# Patient Record
Sex: Female | Born: 1950 | Race: Black or African American | Hispanic: No | Smoking: Former smoker
Health system: Southern US, Community
[De-identification: ages and names within clinical notes are randomized; demographics above are authoritative.]

## PROBLEM LIST (undated history)

## (undated) DIAGNOSIS — C50919 Malignant neoplasm of unspecified site of unspecified female breast: Secondary | ICD-10-CM

## (undated) DIAGNOSIS — K625 Hemorrhage of anus and rectum: Secondary | ICD-10-CM

## (undated) DIAGNOSIS — E785 Hyperlipidemia, unspecified: Secondary | ICD-10-CM

## (undated) DIAGNOSIS — E049 Nontoxic goiter, unspecified: Secondary | ICD-10-CM

## (undated) DIAGNOSIS — H409 Unspecified glaucoma: Secondary | ICD-10-CM

## (undated) DIAGNOSIS — Z923 Personal history of irradiation: Secondary | ICD-10-CM

## (undated) DIAGNOSIS — E119 Type 2 diabetes mellitus without complications: Secondary | ICD-10-CM

## (undated) DIAGNOSIS — I10 Essential (primary) hypertension: Secondary | ICD-10-CM

## (undated) HISTORY — DX: Nontoxic goiter, unspecified: E04.9

## (undated) HISTORY — DX: Essential (primary) hypertension: I10

## (undated) HISTORY — PX: BREAST LUMPECTOMY: SHX2

## (undated) HISTORY — DX: Malignant neoplasm of unspecified site of unspecified female breast: C50.919

## (undated) HISTORY — DX: Hyperlipidemia, unspecified: E78.5

## (undated) HISTORY — DX: Type 2 diabetes mellitus without complications: E11.9

## (undated) HISTORY — DX: Unspecified glaucoma: H40.9

---

## 1984-02-25 HISTORY — PX: ABDOMINAL HYSTERECTOMY: SHX81

## 1999-05-07 ENCOUNTER — Encounter: Payer: Self-pay | Admitting: Obstetrics and Gynecology

## 1999-05-07 ENCOUNTER — Encounter: Admission: RE | Admit: 1999-05-07 | Discharge: 1999-05-07 | Payer: Self-pay | Admitting: Obstetrics and Gynecology

## 1999-05-17 ENCOUNTER — Encounter: Admission: RE | Admit: 1999-05-17 | Discharge: 1999-05-17 | Payer: Self-pay | Admitting: Obstetrics and Gynecology

## 1999-05-17 ENCOUNTER — Encounter: Payer: Self-pay | Admitting: Obstetrics and Gynecology

## 2003-12-09 ENCOUNTER — Emergency Department (HOSPITAL_COMMUNITY): Admission: EM | Admit: 2003-12-09 | Discharge: 2003-12-09 | Payer: Self-pay | Admitting: Emergency Medicine

## 2005-01-23 ENCOUNTER — Emergency Department (HOSPITAL_COMMUNITY): Admission: EM | Admit: 2005-01-23 | Discharge: 2005-01-23 | Payer: Self-pay | Admitting: Emergency Medicine

## 2005-10-07 ENCOUNTER — Ambulatory Visit (HOSPITAL_COMMUNITY): Admission: RE | Admit: 2005-10-07 | Discharge: 2005-10-07 | Payer: Self-pay | Admitting: Family Medicine

## 2005-10-24 ENCOUNTER — Ambulatory Visit: Payer: Self-pay | Admitting: Gastroenterology

## 2005-11-19 ENCOUNTER — Ambulatory Visit: Payer: Self-pay | Admitting: Gastroenterology

## 2007-02-25 DIAGNOSIS — Z923 Personal history of irradiation: Secondary | ICD-10-CM

## 2007-02-25 HISTORY — DX: Personal history of irradiation: Z92.3

## 2007-02-25 HISTORY — PX: BREAST LUMPECTOMY: SHX2

## 2007-09-16 ENCOUNTER — Ambulatory Visit (HOSPITAL_COMMUNITY): Admission: RE | Admit: 2007-09-16 | Discharge: 2007-09-16 | Payer: Self-pay | Admitting: Family Medicine

## 2007-10-07 ENCOUNTER — Encounter: Admission: RE | Admit: 2007-10-07 | Discharge: 2007-10-07 | Payer: Self-pay | Admitting: Family Medicine

## 2007-10-15 ENCOUNTER — Encounter: Admission: RE | Admit: 2007-10-15 | Discharge: 2007-10-15 | Payer: Self-pay | Admitting: Family Medicine

## 2007-10-26 DIAGNOSIS — C50919 Malignant neoplasm of unspecified site of unspecified female breast: Secondary | ICD-10-CM

## 2007-10-26 HISTORY — DX: Malignant neoplasm of unspecified site of unspecified female breast: C50.919

## 2007-11-08 ENCOUNTER — Ambulatory Visit (HOSPITAL_BASED_OUTPATIENT_CLINIC_OR_DEPARTMENT_OTHER): Admission: RE | Admit: 2007-11-08 | Discharge: 2007-11-08 | Payer: Self-pay | Admitting: General Surgery

## 2007-11-08 ENCOUNTER — Encounter (INDEPENDENT_AMBULATORY_CARE_PROVIDER_SITE_OTHER): Payer: Self-pay | Admitting: General Surgery

## 2007-11-08 ENCOUNTER — Encounter: Admission: RE | Admit: 2007-11-08 | Discharge: 2007-11-08 | Payer: Self-pay | Admitting: General Surgery

## 2007-11-08 HISTORY — PX: BREAST SURGERY: SHX581

## 2007-12-01 ENCOUNTER — Ambulatory Visit (HOSPITAL_BASED_OUTPATIENT_CLINIC_OR_DEPARTMENT_OTHER): Admission: RE | Admit: 2007-12-01 | Discharge: 2007-12-01 | Payer: Self-pay | Admitting: General Surgery

## 2007-12-01 ENCOUNTER — Encounter (INDEPENDENT_AMBULATORY_CARE_PROVIDER_SITE_OTHER): Payer: Self-pay | Admitting: General Surgery

## 2007-12-01 HISTORY — PX: BREAST BIOPSY: SHX20

## 2007-12-02 ENCOUNTER — Ambulatory Visit: Payer: Self-pay | Admitting: Oncology

## 2007-12-22 LAB — CBC WITH DIFFERENTIAL/PLATELET
BASO%: 0.2 % (ref 0.0–2.0)
HCT: 38.9 % (ref 34.8–46.6)
MCHC: 34 g/dL (ref 32.0–36.0)
MONO#: 0.5 10*3/uL (ref 0.1–0.9)
RBC: 4.51 10*6/uL (ref 3.70–5.32)
WBC: 4.3 10*3/uL (ref 3.9–10.0)
lymph#: 1.6 10*3/uL (ref 0.9–3.3)

## 2007-12-23 LAB — COMPREHENSIVE METABOLIC PANEL
ALT: 20 U/L (ref 0–35)
CO2: 26 mEq/L (ref 19–32)
Calcium: 9.5 mg/dL (ref 8.4–10.5)
Chloride: 100 mEq/L (ref 96–112)
Sodium: 138 mEq/L (ref 135–145)
Total Protein: 7.7 g/dL (ref 6.0–8.3)

## 2007-12-23 LAB — LACTATE DEHYDROGENASE: LDH: 122 U/L (ref 94–250)

## 2007-12-28 ENCOUNTER — Ambulatory Visit (HOSPITAL_COMMUNITY): Admission: RE | Admit: 2007-12-28 | Discharge: 2007-12-28 | Payer: Self-pay | Admitting: Oncology

## 2008-01-10 ENCOUNTER — Encounter: Admission: RE | Admit: 2008-01-10 | Discharge: 2008-01-10 | Payer: Self-pay | Admitting: Oncology

## 2008-01-11 ENCOUNTER — Ambulatory Visit: Admission: RE | Admit: 2008-01-11 | Discharge: 2008-03-27 | Payer: Self-pay | Admitting: Radiation Oncology

## 2008-02-25 HISTORY — PX: COLONOSCOPY: SHX174

## 2008-03-30 ENCOUNTER — Encounter: Payer: Self-pay | Admitting: Gastroenterology

## 2008-04-04 ENCOUNTER — Encounter: Payer: Self-pay | Admitting: Gastroenterology

## 2008-05-02 ENCOUNTER — Ambulatory Visit: Payer: Self-pay | Admitting: Oncology

## 2008-05-02 ENCOUNTER — Ambulatory Visit: Payer: Self-pay | Admitting: Gastroenterology

## 2008-05-02 DIAGNOSIS — R195 Other fecal abnormalities: Secondary | ICD-10-CM

## 2008-05-02 DIAGNOSIS — Z860100 Personal history of colon polyps, unspecified: Secondary | ICD-10-CM | POA: Insufficient documentation

## 2008-05-04 LAB — CBC WITH DIFFERENTIAL/PLATELET
BASO%: 0.3 % (ref 0.0–2.0)
EOS%: 1.9 % (ref 0.0–7.0)
HCT: 38 % (ref 34.8–46.6)
LYMPH%: 28.7 % (ref 14.0–49.7)
MCH: 29.3 pg (ref 25.1–34.0)
MCHC: 33.7 g/dL (ref 31.5–36.0)
NEUT%: 58.7 % (ref 38.4–76.8)
lymph#: 1.5 10*3/uL (ref 0.9–3.3)

## 2008-05-05 LAB — COMPREHENSIVE METABOLIC PANEL
AST: 13 U/L (ref 0–37)
BUN: 17 mg/dL (ref 6–23)
Calcium: 9.3 mg/dL (ref 8.4–10.5)
Chloride: 104 mEq/L (ref 96–112)
Creatinine, Ser: 0.83 mg/dL (ref 0.40–1.20)
Total Bilirubin: 0.3 mg/dL (ref 0.3–1.2)

## 2008-05-05 LAB — VITAMIN D 25 HYDROXY (VIT D DEFICIENCY, FRACTURES): Vit D, 25-Hydroxy: 34 ng/mL (ref 30–89)

## 2008-05-05 LAB — LACTATE DEHYDROGENASE: LDH: 128 U/L (ref 94–250)

## 2008-06-06 ENCOUNTER — Ambulatory Visit: Payer: Self-pay | Admitting: Gastroenterology

## 2008-08-03 ENCOUNTER — Ambulatory Visit: Payer: Self-pay | Admitting: Oncology

## 2008-08-08 LAB — COMPREHENSIVE METABOLIC PANEL
AST: 18 U/L (ref 0–37)
Albumin: 3.9 g/dL (ref 3.5–5.2)
BUN: 13 mg/dL (ref 6–23)
CO2: 26 mEq/L (ref 19–32)
Calcium: 9.2 mg/dL (ref 8.4–10.5)
Chloride: 103 mEq/L (ref 96–112)
Glucose, Bld: 113 mg/dL — ABNORMAL HIGH (ref 70–99)
Potassium: 3.6 mEq/L (ref 3.5–5.3)

## 2008-08-08 LAB — CBC WITH DIFFERENTIAL/PLATELET
BASO%: 0.5 % (ref 0.0–2.0)
Eosinophils Absolute: 0.1 10*3/uL (ref 0.0–0.5)
LYMPH%: 32 % (ref 14.0–49.7)
MCHC: 34.8 g/dL (ref 31.5–36.0)
MCV: 86 fL (ref 79.5–101.0)
MONO#: 0.5 10*3/uL (ref 0.1–0.9)
MONO%: 9.6 % (ref 0.0–14.0)
NEUT#: 3.2 10*3/uL (ref 1.5–6.5)
Platelets: 310 10*3/uL (ref 145–400)
RBC: 4.21 10*6/uL (ref 3.70–5.45)
RDW: 13.9 % (ref 11.2–14.5)
WBC: 5.7 10*3/uL (ref 3.9–10.3)

## 2008-08-09 LAB — CANCER ANTIGEN 27.29: CA 27.29: 13 U/mL (ref 0–39)

## 2008-08-09 LAB — VITAMIN D 25 HYDROXY (VIT D DEFICIENCY, FRACTURES): Vit D, 25-Hydroxy: 40 ng/mL (ref 30–89)

## 2008-09-18 ENCOUNTER — Encounter: Admission: RE | Admit: 2008-09-18 | Discharge: 2008-09-18 | Payer: Self-pay | Admitting: Oncology

## 2008-11-10 ENCOUNTER — Emergency Department (HOSPITAL_COMMUNITY): Admission: EM | Admit: 2008-11-10 | Discharge: 2008-11-10 | Payer: Self-pay | Admitting: Emergency Medicine

## 2009-02-14 ENCOUNTER — Ambulatory Visit: Payer: Self-pay | Admitting: Oncology

## 2009-02-20 LAB — CBC WITH DIFFERENTIAL/PLATELET
BASO%: 0.5 % (ref 0.0–2.0)
Basophils Absolute: 0 10*3/uL (ref 0.0–0.1)
EOS%: 0.6 % (ref 0.0–7.0)
Eosinophils Absolute: 0 10*3/uL (ref 0.0–0.5)
HCT: 39.5 % (ref 34.8–46.6)
HGB: 13.3 g/dL (ref 11.6–15.9)
LYMPH%: 33 % (ref 14.0–49.7)
MCH: 29.1 pg (ref 25.1–34.0)
MCHC: 33.5 g/dL (ref 31.5–36.0)
MCV: 86.8 fL (ref 79.5–101.0)
MONO#: 0.5 10*3/uL (ref 0.1–0.9)
MONO%: 8.3 % (ref 0.0–14.0)
NEUT#: 3.7 10*3/uL (ref 1.5–6.5)
NEUT%: 57.6 % (ref 38.4–76.8)
Platelets: 335 10*3/uL (ref 145–400)
RBC: 4.56 10*6/uL (ref 3.70–5.45)
RDW: 14.4 % (ref 11.2–14.5)
WBC: 6.4 10*3/uL (ref 3.9–10.3)
lymph#: 2.1 10*3/uL (ref 0.9–3.3)

## 2009-02-20 LAB — COMPREHENSIVE METABOLIC PANEL
ALT: 16 U/L (ref 0–35)
CO2: 26 mEq/L (ref 19–32)
Calcium: 9.1 mg/dL (ref 8.4–10.5)
Chloride: 103 mEq/L (ref 96–112)
Creatinine, Ser: 0.96 mg/dL (ref 0.40–1.20)
Glucose, Bld: 104 mg/dL — ABNORMAL HIGH (ref 70–99)
Total Bilirubin: 0.5 mg/dL (ref 0.3–1.2)

## 2009-02-20 LAB — LACTATE DEHYDROGENASE: LDH: 122 U/L (ref 94–250)

## 2009-02-21 LAB — CANCER ANTIGEN 27.29: CA 27.29: 9 U/mL (ref 0–39)

## 2009-08-06 IMAGING — MG MM SCREENING MAMMOGRAM RIGHT
3 series · 3 of 3 positions shown · non-contrast
Comparison: 09/16/2007

CLINICAL DATA: The patient underwent left lumpectomy and radiation
therapy for breast cancer in 7337.

DIGITAL DIAGNOSTIC BILATERAL MAMMOGRAM WITH CAD

[R CC]
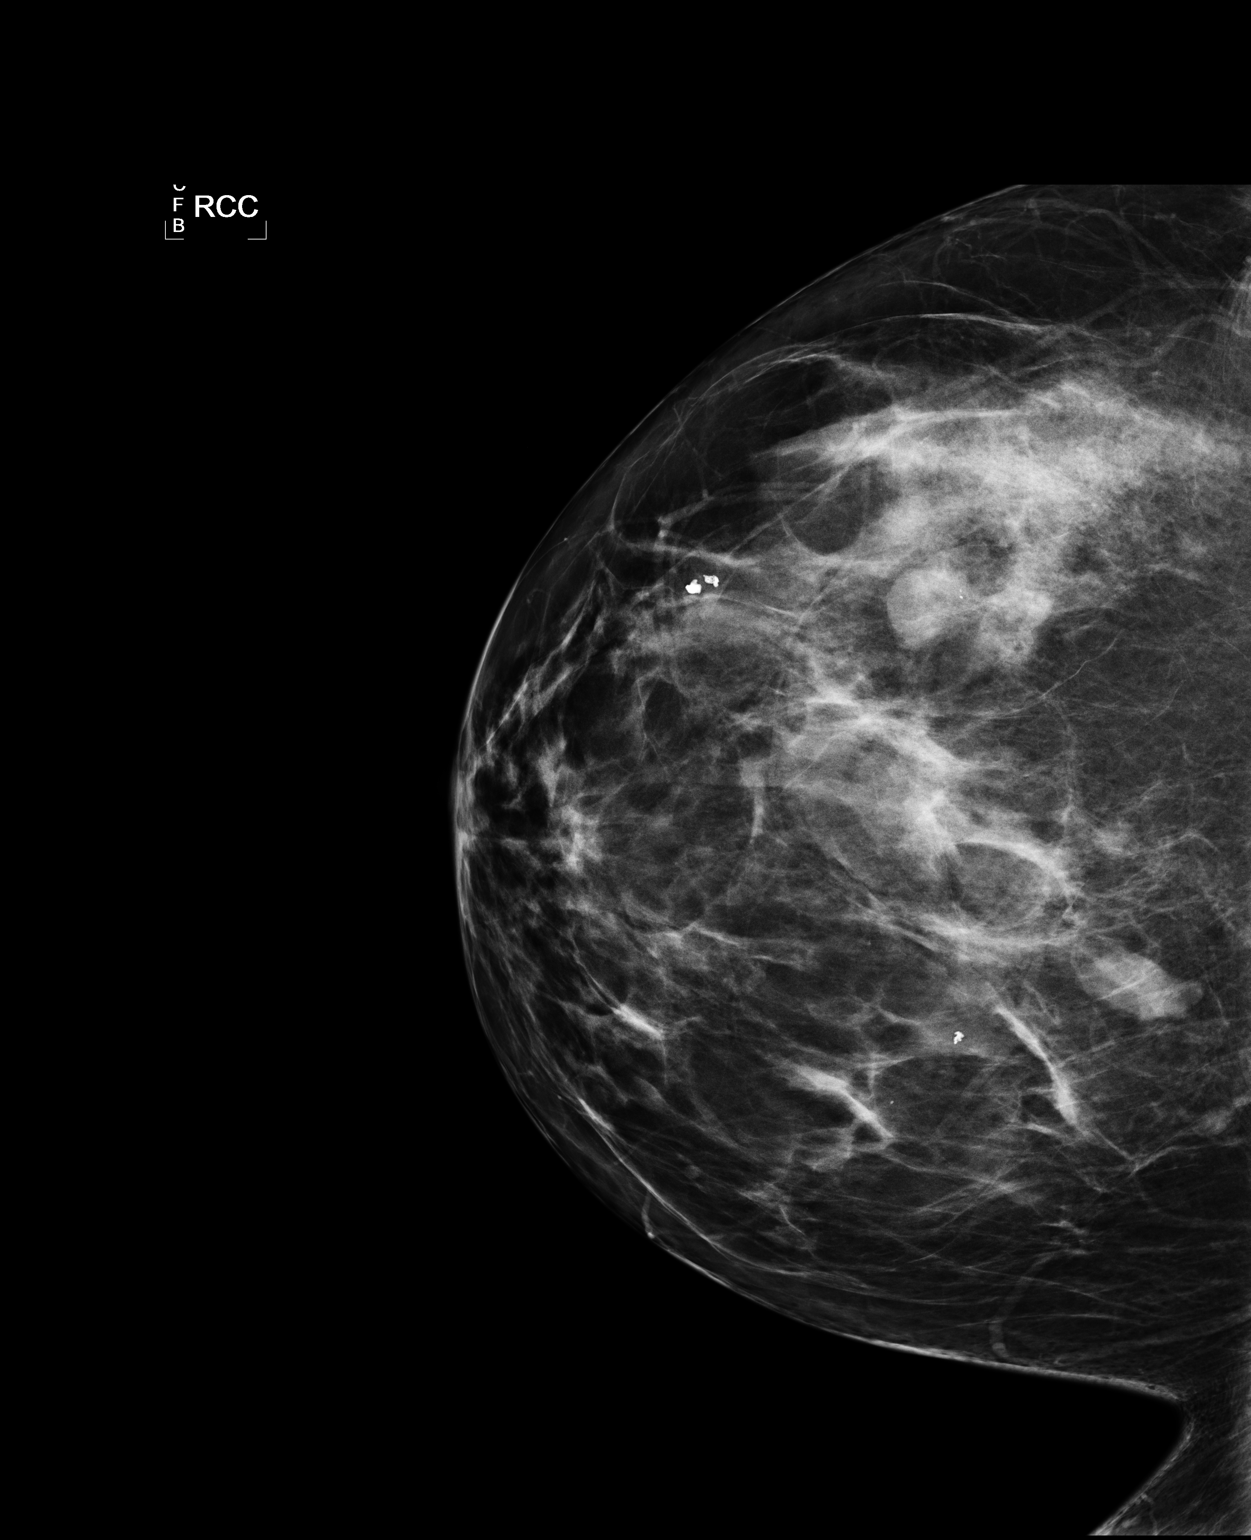

[R MLO]
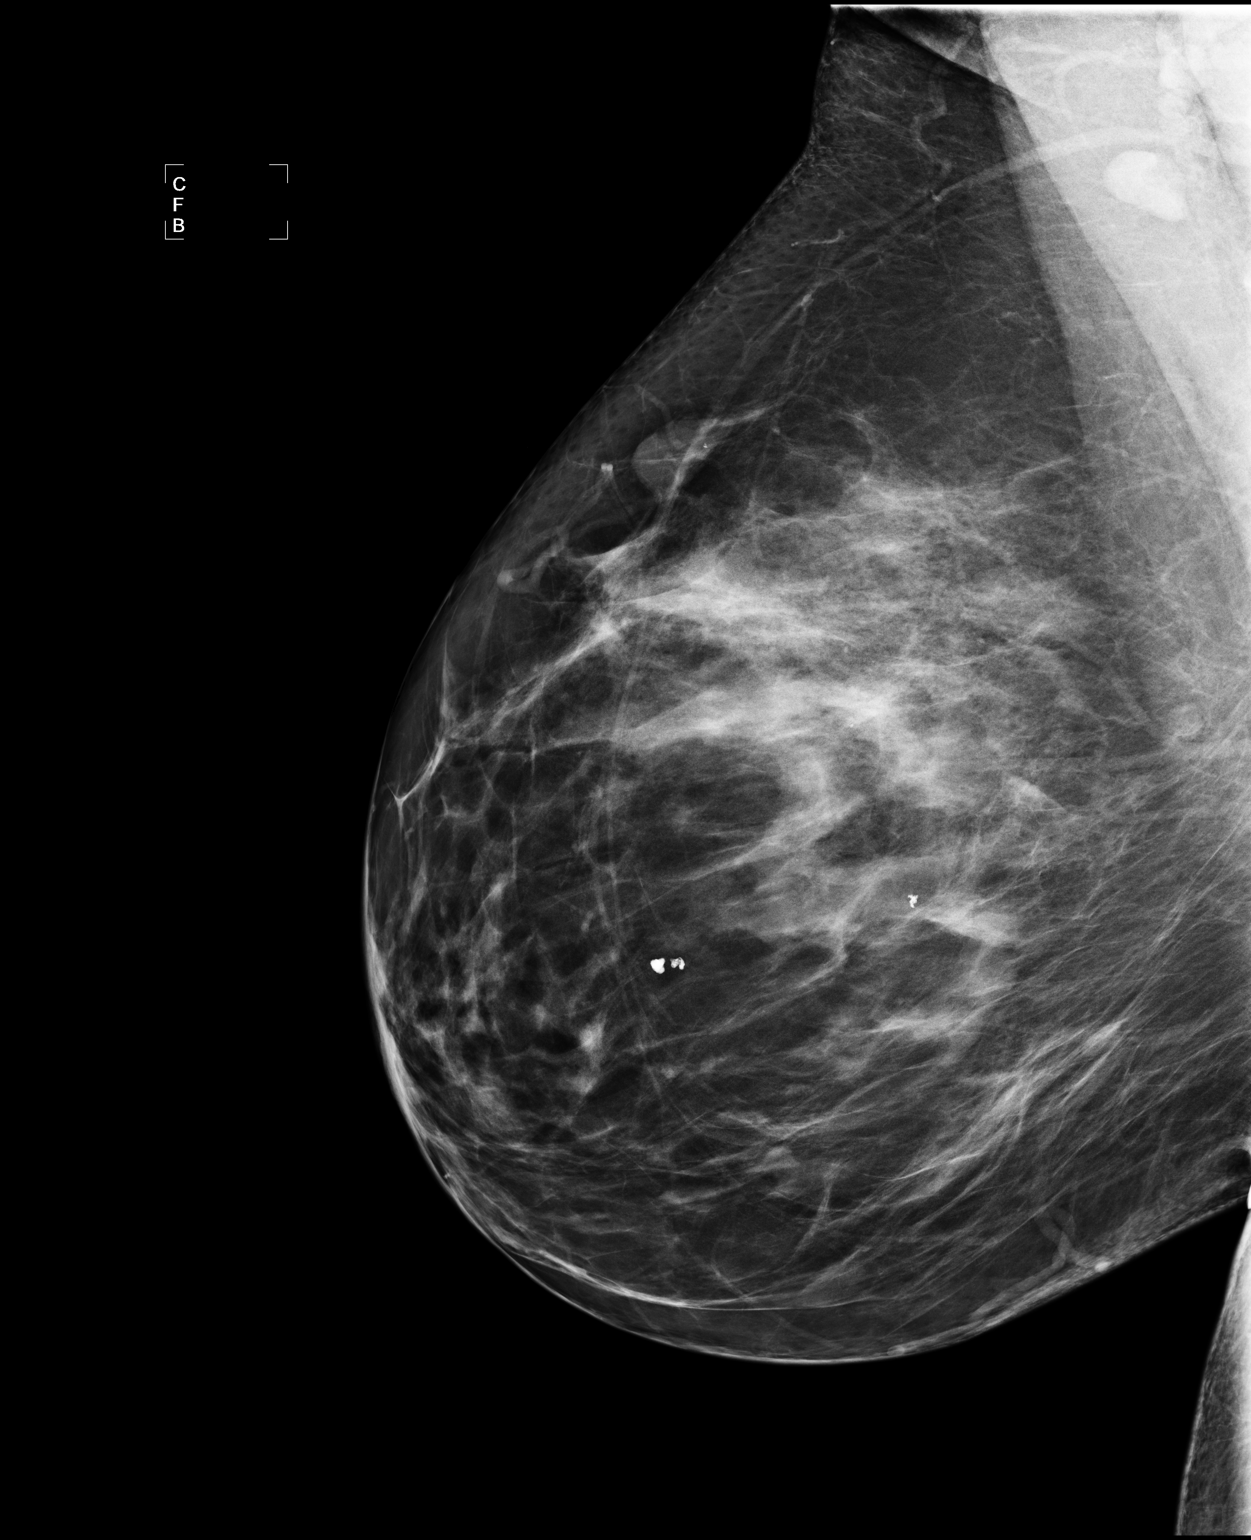

[L TAN]
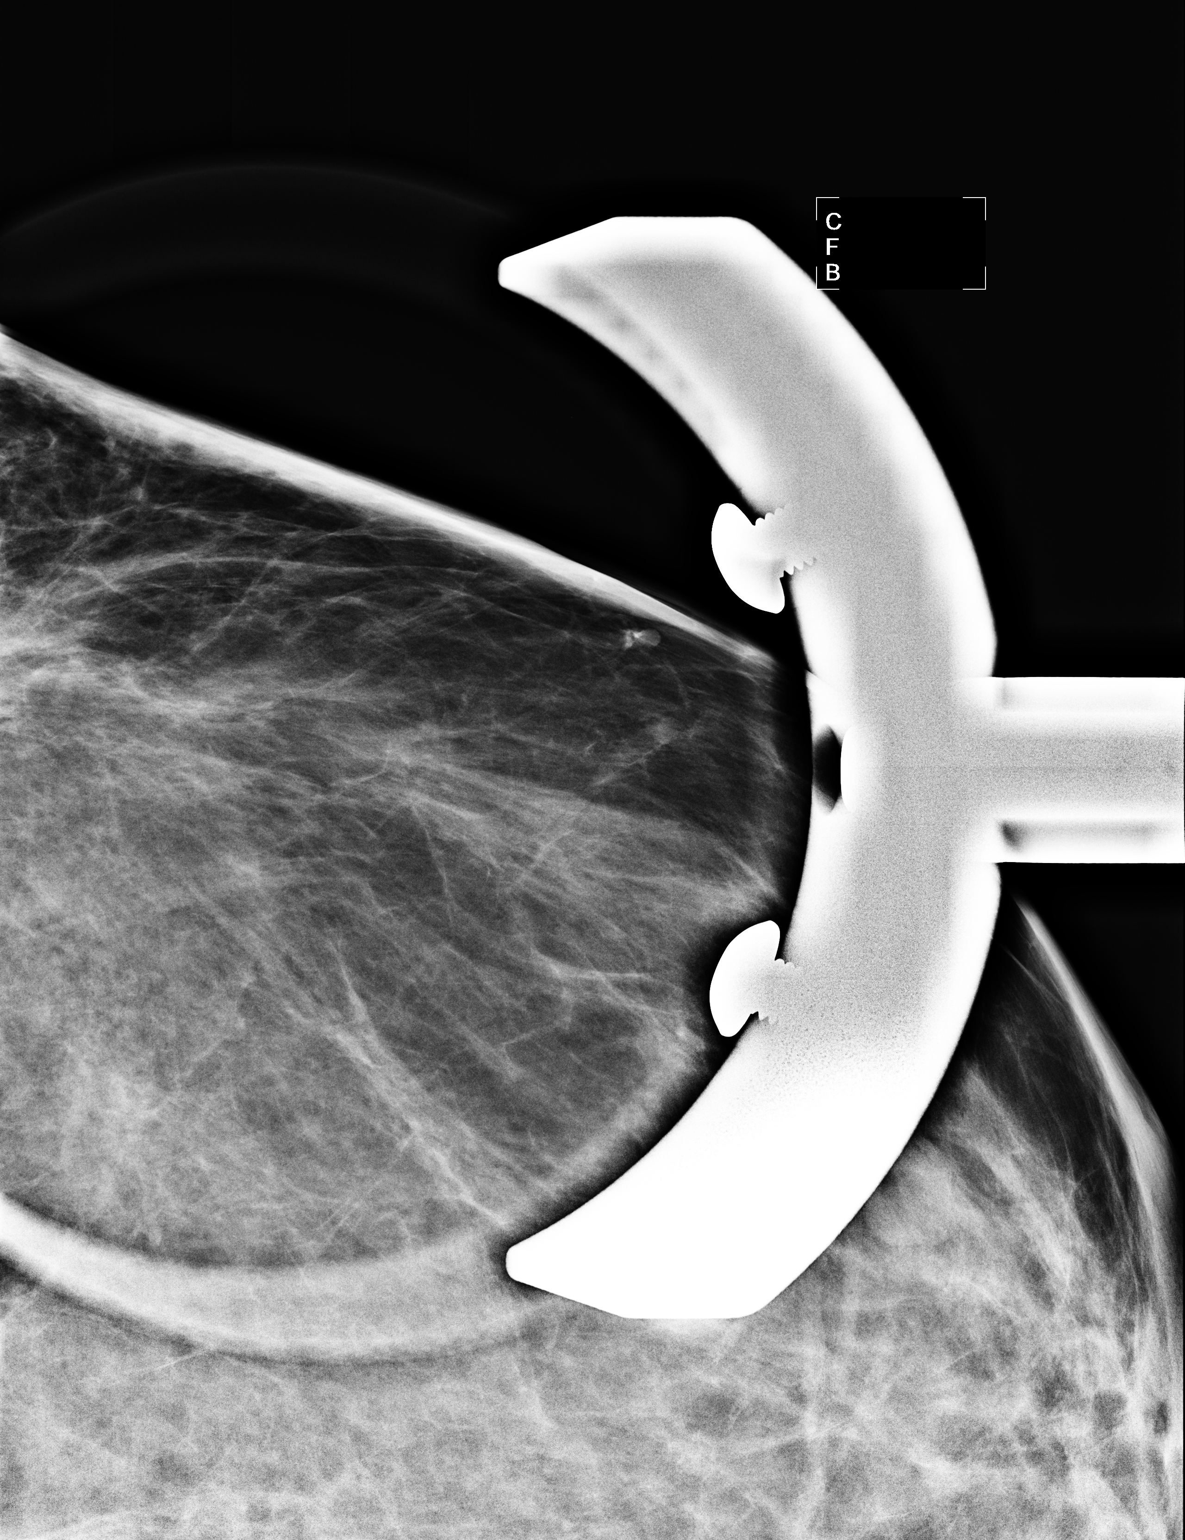

[3 of 3 positions shown; findings below may reference images not displayed]

FINDINGS: There are scattered fibroglandular densities.  Benign
appearing nodules consistent with fibroadenomas bilaterally are
stable.  Left lumpectomy changes are present.  There is no
suspicious dominant mass, nonsurgical architectural distortion or
calcification to suggest malignancy.
Mammographic images were processed with CAD.
IMPRESSION: No mammographic evidence of malignancy.  Yearly diagnostic
mammography is suggested.

BI-RADS CATEGORY 2:  Benign finding(s).

## 2009-08-22 ENCOUNTER — Ambulatory Visit: Payer: Self-pay | Admitting: Oncology

## 2009-08-24 LAB — COMPREHENSIVE METABOLIC PANEL
AST: 16 U/L (ref 0–37)
Alkaline Phosphatase: 36 U/L — ABNORMAL LOW (ref 39–117)
BUN: 20 mg/dL (ref 6–23)
Calcium: 9.5 mg/dL (ref 8.4–10.5)
Chloride: 103 mEq/L (ref 96–112)
Creatinine, Ser: 0.77 mg/dL (ref 0.40–1.20)
Total Bilirubin: 0.3 mg/dL (ref 0.3–1.2)

## 2009-08-24 LAB — CBC WITH DIFFERENTIAL/PLATELET
BASO%: 0.2 % (ref 0.0–2.0)
EOS%: 0.9 % (ref 0.0–7.0)
HCT: 35.1 % (ref 34.8–46.6)
LYMPH%: 32.7 % (ref 14.0–49.7)
MCH: 28.5 pg (ref 25.1–34.0)
MCHC: 33.6 g/dL (ref 31.5–36.0)
MCV: 84.8 fL (ref 79.5–101.0)
MONO%: 6.6 % (ref 0.0–14.0)
NEUT%: 59.6 % (ref 38.4–76.8)
Platelets: 300 10*3/uL (ref 145–400)

## 2009-08-24 LAB — VITAMIN D 25 HYDROXY (VIT D DEFICIENCY, FRACTURES): Vit D, 25-Hydroxy: 49 ng/mL (ref 30–89)

## 2009-09-21 ENCOUNTER — Encounter: Admission: RE | Admit: 2009-09-21 | Discharge: 2009-09-21 | Payer: Self-pay | Admitting: Oncology

## 2010-03-18 ENCOUNTER — Ambulatory Visit: Payer: Self-pay | Admitting: Oncology

## 2010-03-20 LAB — CBC WITH DIFFERENTIAL/PLATELET
Basophils Absolute: 0 10*3/uL (ref 0.0–0.1)
Eosinophils Absolute: 0.1 10*3/uL (ref 0.0–0.5)
HCT: 36.5 % (ref 34.8–46.6)
HGB: 12.2 g/dL (ref 11.6–15.9)
MCH: 28.5 pg (ref 25.1–34.0)
MCV: 85.3 fL (ref 79.5–101.0)
MONO%: 6.5 % (ref 0.0–14.0)
NEUT#: 3.6 10*3/uL (ref 1.5–6.5)
NEUT%: 56.8 % (ref 38.4–76.8)
RDW: 13.9 % (ref 11.2–14.5)

## 2010-03-21 LAB — FOLLICLE STIMULATING HORMONE: FSH: 28.5 m[IU]/mL

## 2010-03-21 LAB — COMPREHENSIVE METABOLIC PANEL
Albumin: 4.5 g/dL (ref 3.5–5.2)
BUN: 24 mg/dL — ABNORMAL HIGH (ref 6–23)
CO2: 24 mEq/L (ref 19–32)
Glucose, Bld: 94 mg/dL (ref 70–99)
Potassium: 3.8 mEq/L (ref 3.5–5.3)
Sodium: 137 mEq/L (ref 135–145)
Total Bilirubin: 0.3 mg/dL (ref 0.3–1.2)
Total Protein: 7.6 g/dL (ref 6.0–8.3)

## 2010-03-21 LAB — VITAMIN D 25 HYDROXY (VIT D DEFICIENCY, FRACTURES): Vit D, 25-Hydroxy: 42 ng/mL (ref 30–89)

## 2010-03-21 LAB — CANCER ANTIGEN 27.29: CA 27.29: 17 U/mL (ref 0–39)

## 2010-03-27 DIAGNOSIS — E049 Nontoxic goiter, unspecified: Secondary | ICD-10-CM

## 2010-03-27 HISTORY — DX: Nontoxic goiter, unspecified: E04.9

## 2010-04-03 ENCOUNTER — Other Ambulatory Visit: Payer: Self-pay | Admitting: Oncology

## 2010-04-03 ENCOUNTER — Encounter: Payer: PRIVATE HEALTH INSURANCE | Admitting: Oncology

## 2010-04-03 DIAGNOSIS — Z780 Asymptomatic menopausal state: Secondary | ICD-10-CM

## 2010-04-03 DIAGNOSIS — Z9889 Other specified postprocedural states: Secondary | ICD-10-CM

## 2010-04-05 ENCOUNTER — Inpatient Hospital Stay (INDEPENDENT_AMBULATORY_CARE_PROVIDER_SITE_OTHER)
Admission: RE | Admit: 2010-04-05 | Discharge: 2010-04-05 | Disposition: A | Discharge status: Home / Self Care | Payer: PRIVATE HEALTH INSURANCE | Source: Ambulatory Visit | Attending: Family Medicine | Admitting: Family Medicine

## 2010-04-05 DIAGNOSIS — T783XXA Angioneurotic edema, initial encounter: Secondary | ICD-10-CM

## 2010-04-22 ENCOUNTER — Other Ambulatory Visit: Payer: Self-pay | Admitting: Family Medicine

## 2010-04-22 DIAGNOSIS — E049 Nontoxic goiter, unspecified: Secondary | ICD-10-CM

## 2010-04-23 ENCOUNTER — Ambulatory Visit
Admission: RE | Admit: 2010-04-23 | Discharge: 2010-04-23 | Disposition: A | Discharge status: Home / Self Care | Payer: PRIVATE HEALTH INSURANCE | Source: Ambulatory Visit | Attending: Family Medicine | Admitting: Family Medicine

## 2010-04-24 ENCOUNTER — Ambulatory Visit
Admission: RE | Admit: 2010-04-24 | Discharge: 2010-04-24 | Disposition: A | Discharge status: Home / Self Care | Payer: PRIVATE HEALTH INSURANCE | Source: Ambulatory Visit | Attending: Family Medicine | Admitting: Family Medicine

## 2010-04-24 DIAGNOSIS — E049 Nontoxic goiter, unspecified: Secondary | ICD-10-CM

## 2010-05-02 ENCOUNTER — Other Ambulatory Visit: Payer: Self-pay | Admitting: Family Medicine

## 2010-05-02 DIAGNOSIS — E041 Nontoxic single thyroid nodule: Secondary | ICD-10-CM

## 2010-05-14 ENCOUNTER — Other Ambulatory Visit: Payer: Self-pay | Admitting: Interventional Radiology

## 2010-05-14 ENCOUNTER — Ambulatory Visit
Admission: RE | Admit: 2010-05-14 | Discharge: 2010-05-14 | Disposition: A | Discharge status: Home / Self Care | Payer: PRIVATE HEALTH INSURANCE | Source: Ambulatory Visit | Attending: Family Medicine | Admitting: Family Medicine

## 2010-05-14 ENCOUNTER — Other Ambulatory Visit (HOSPITAL_COMMUNITY)
Admission: RE | Admit: 2010-05-14 | Discharge: 2010-05-14 | Disposition: A | Discharge status: Home / Self Care | Payer: PRIVATE HEALTH INSURANCE | Source: Ambulatory Visit | Attending: Interventional Radiology | Admitting: Interventional Radiology

## 2010-05-14 DIAGNOSIS — E049 Nontoxic goiter, unspecified: Secondary | ICD-10-CM | POA: Insufficient documentation

## 2010-05-14 DIAGNOSIS — E041 Nontoxic single thyroid nodule: Secondary | ICD-10-CM

## 2010-07-09 NOTE — Op Note (Signed)
NAMERAEVIN, WIERENGA                 ACCOUNT NO.:  [id number]   MEDICAL RECORD NO.:  [id number]          PATIENT TYPE:  AMB   LOCATION:  DSC                          FACILITY:  [facility]   PHYSICIAN:  Angelia Mould. Derrell Lolling, M.D.DATE OF BIRTH:  Feb 21, 1951   DATE OF PROCEDURE:  12/01/2007  DATE OF DISCHARGE:                               OPERATIVE REPORT   PREOPERATIVE DIAGNOSIS:  Invasive cancer, left breast.   POSTOPERATIVE DIAGNOSIS:  Invasive cancer, left breast.   OPERATION PERFORMED:  1. Injection of blue dye, left breast.  2. Left axillary sentinel lymph node mapping and biopsy.   SURGEON:  Angelia Mould. Derrell Lolling, MD   OPERATIVE INDICATIONS:  This is a 60 year old black female who was found  to have an abnormal mammogram showing microcalcifications of the left  breast laterally.  This area underwent image-guided biopsy, which showed  ductal carcinoma in situ.  There was no report that this had any  necrosis nor was there any report that this was a high-grade tumor.  She  underwent left partial mastectomy with needle localization on November 08, 2007.  This showed an extensive ductal carcinoma in situ,  intermediate high grade with associated necrosis and a 9-mm area of  invasion ductal carcinoma.  All the resection margins were negative.  I  advised the patient that we would need to stage her left axillary lymph  nodes, and she is brought to the operating room for a left axillary  sentinel node biopsy, possible left axillary lymph node dissection.   OPERATIVE TECHNIQUE:  The patient underwent injection of radionuclide in  the left subareolar area by the nuclear medicine technician in the  holding area.  She was taken to the operating room.  General anesthesia  was induced.  Following alcohol prep, I injected 5 mL of blue dye into  the left subareolar area.  This was 2 mL of methylene blue mixed with 3  mL of saline.  We then massaged the breast for 5 minutes.  The left  breast and left  axilla were then prepped and draped in a sterile  fashion.   Using the NeoProbe, I was able to localize an area of focal increased  radioactivity.  A 0.5% Marcaine with epinephrine was used as local  infiltration anesthetic, and we made a transverse skin incision at this  site, which was just about at the hairline.  This was made through the  skin crease.  Dissection was carried down through the subcutaneous  tissue.  Using the NeoProbe as a guide, we dissected all the way into  the clavipectoral fascia and we identified a single large very hot, very  blue lymph node.  We dissected this away from the surrounding  structures.  A couple of tiny arterial bleeders were controlled with  electrocautery.  Dr. Berneta Levins performed imprint cytology on this  lymph node and called back and told me that it was negative for any  evidence of cancer cells.  That will be submitted for further  evaluation.  The left axillary wound was irrigated with saline.  Hemostasis  was excellent.  The deeper subcutaneous tissue was closed  with interrupted sutures of 3-0 Vicryl and the skin was closed with  running  subcuticular suture of 4-0 Monocryl and Steri-Strips.  Clean bandages  were placed and the patient was taken to the recovery room in stable  condition.  Estimated blood loss was about 10-15 mL.  Complications were  none.  Sponge, needle, and instrument counts were correct.      Angelia Mould. Derrell Lolling, M.D.  Electronically Signed     HMI/MEDQ  D:  12/01/2007  T:  12/02/2007  Job:  [id number]   cc:   Peyton Najjar, MD

## 2010-07-09 NOTE — Op Note (Signed)
Ellen [name], MERVIN                 ACCOUNT NO.:  [id number]   MEDICAL RECORD NO.:  [id number]          PATIENT TYPE:  AMB   LOCATION:  DSC                          FACILITY:  [facility]   PHYSICIAN:  Angelia Mould. Derrell Lolling, M.D.DATE OF BIRTH:  [birth year]   DATE OF PROCEDURE:  11/08/2007  DATE OF DISCHARGE:                               OPERATIVE REPORT   PREOPERATIVE DIAGNOSIS:  Ductal carcinoma in situ, left breast.   POSTOPERATIVE DIAGNOSIS:  Ductal carcinoma in situ, left breast.   OPERATION PERFORMED:  Left partial mastectomy with needle localization  and specimen mammogram.   SURGEON:  Angelia Mould. Derrell Lolling, MD   OPERATIVE INDICATIONS:  This is a 60 year old black female who had  screening mammograms and then subsequently had multiple other images.  Identified was an area of microcalcifications in the lateral left  breast.  The MRI and the mammogram suggested that this was over 4 cm in  size but was localized as a solitary finding.  The patient was  interested in breast conservation.  Because her breasts were large in  size, I felt that we could make an attempt to excise this and try to get  negative margins.  She was in favor of making that attempt.  She had 2  bracketed wires placed this morning to go posterior and anterior (there  were calcifications), and she is brought to the operating room  electively.   OPERATIVE TECHNIQUE:  The patient was brought to the operating room,  placed supine on the operating table.  She was identified as the correct  patient, correct procedure, and correct site.  The left breast was  prepped and draped in a sterile fashion.  Intravenous antibiotics were  given.   I reviewed the mammograms and I felt that the best incision would be a  transverse radial incision at about the 9 o'clock position.  A 0.5%  Marcaine with epinephrine was used as a local infiltration anesthetic.  A transverse incision was made laterally at the 3 o'clock position to  the left  breast between the 2 localizing wires.  Dissection was carried  down into the subcutaneous tissue, and then using a variety of traction  and countertraction, I dissected widely around the wires.  In one area,  I got all the way down to the pectoralis fascia.  I marked the specimen  with silk sutures and removed the specimen from the patient.  I then  marked the specimen with the 6-color margin marker kit and allowed that  to dry.  We then did a specimen mammogram.  This was reviewed by Dr.  Judyann Munson at the [facility] [place] [facility].  She said that it looked good  and that I had removed all of the calcifications.  Specimen was then  sent for routine histology.  Hemostasis was excellent and achieved with  electrocautery.  One blood vessel on the pectoralis muscle was suture  ligated with a figure-of-eight suture of 3-0 Vicryl.  The wound was  copiously irrigated with saline.  It looked quite good and hemostatic.  The breast tissue was closed in  3 layers with interrupted sutures of 3-0  Vicryl, a deeper layer with interrupted 3-0 Vicryl, a superficial layer  with 3-0 Vicryl, and the skin was  closed with a running subcuticular suture of 4-0 Monocryl and Steri-  Strips.  Clean bandages were placed, and the patient was taken to the  recovery room in stable condition.  Estimated blood loss was about 25  mL.  Complications none.  Sponge, needle, and instrument counts were  correct.      Angelia Mould. Derrell Lolling, M.D.  Electronically Signed     HMI/MEDQ  D:  11/08/2007  T:  11/09/2007  Job:  [id number]   cc:   Peyton Najjar, MD

## 2010-07-12 NOTE — Assessment & Plan Note (Signed)
[facility]                           GASTROENTEROLOGY OFFICE NOTE   Ellen [name], Ellen [name]                        MRN:          [id number]  DATE:10/24/2005                            DOB:          03-Dec-1950    NEW GI CONSULTATION:  The patient was referred by Tracey Harries, MD.   REASON FOR REFERRAL:  Dr. Everlene Other asked me to evaluate Ellen [name] in  consultation regarding colorectal cancer screening.   HISTORY OF PRESENT ILLNESS:  Ellen [name] is a very pleasant 60 year old woman  who has never had trouble with her bowels, specifically no bleeding,  constipation or dramatic diarrhea.  She has never had colorectal cancer  screening.   REVIEW OF SYSTEMS:  Essentially negative and is available on her nursing  intake sheet.   PAST MEDICAL HISTORY:  1. Hypertension.  2. Elevated cholesterol.  3. Status post hysterectomy in 1986.   CURRENT MEDICATIONS:  Lisinopril, simvastatin.   ALLERGIES:  No known drug allergies.   SOCIAL HISTORY:  Divorced, 3 children.  Works as a Lobbyist.  Smokes  a half pack a day, is trying to quit.  Nondrinker.   FAMILY HISTORY:  No colon cancer or colon polyps in the family.   PHYSICAL EXAMINATION:  VITAL SIGNS:  5 feet 2 inches, 166 pounds.  Blood  pressure 118/72, pulse 80.  CONSTITUTIONAL:  Generally well-appearing.  NEUROLOGIC:  Alert and oriented x3.  HEENT:  Eyes:  Extraocular movements intact.  Mouth:  Oropharynx moist.  No  lesions.  NECK:  Supple, no lymphadenopathy.  CARDIOVASCULAR:  Heart regular rate and rhythm.  LUNGS:  Clear to auscultation bilaterally.  ABDOMEN:  Soft, nontender, nondistended, normal bowel sounds.  EXTREMITIES:  No lower extremity edema.  SKIN:  No rash or lesions on the visible extremities.   ASSESSMENT AND PLAN:  A 60 year old woman at routine risk for colorectal  cancer.   We will arrange for her to have a screening colonoscopy done at her soonest  convenience.  She  understands there are risks, benefits, and alternatives to  the procedure.  Specifically, we  discussed bleeding, perforation and missed cancer.  She understands and  wishes to proceed.  I see no reason for any further blood tests or imaging  studies prior to then.                                   Rachael Fee, MD   DPJ/MedQ  DD:  10/24/2005  DT:  10/25/2005  Job #:  [id number]   cc:   Tracey Harries, MD

## 2010-07-25 LAB — LIPID PANEL
Cholesterol: 168 mg/dL (ref 0–200)
Triglycerides: 131 mg/dL (ref 40–160)

## 2010-07-25 LAB — BASIC METABOLIC PANEL
Glucose: 89 mg/dL
Potassium: 4.3 mmol/L (ref 3.4–5.3)

## 2010-09-23 ENCOUNTER — Ambulatory Visit
Admission: RE | Admit: 2010-09-23 | Discharge: 2010-09-23 | Disposition: A | Discharge status: Home / Self Care | Payer: PRIVATE HEALTH INSURANCE | Source: Ambulatory Visit | Attending: Oncology | Admitting: Oncology

## 2010-09-23 DIAGNOSIS — Z780 Asymptomatic menopausal state: Secondary | ICD-10-CM

## 2010-09-23 DIAGNOSIS — Z9889 Other specified postprocedural states: Secondary | ICD-10-CM

## 2010-09-25 ENCOUNTER — Other Ambulatory Visit: Payer: Self-pay | Admitting: Oncology

## 2010-09-25 ENCOUNTER — Encounter (HOSPITAL_BASED_OUTPATIENT_CLINIC_OR_DEPARTMENT_OTHER): Payer: PRIVATE HEALTH INSURANCE | Admitting: Oncology

## 2010-09-25 DIAGNOSIS — Z170 Estrogen receptor positive status [ER+]: Secondary | ICD-10-CM

## 2010-09-25 DIAGNOSIS — C50419 Malignant neoplasm of upper-outer quadrant of unspecified female breast: Secondary | ICD-10-CM

## 2010-09-25 LAB — CBC WITH DIFFERENTIAL/PLATELET
EOS%: 1.3 % (ref 0.0–7.0)
Eosinophils Absolute: 0.1 10*3/uL (ref 0.0–0.5)
LYMPH%: 32.9 % (ref 14.0–49.7)
MCH: 28.8 pg (ref 25.1–34.0)
MCV: 85.5 fL (ref 79.5–101.0)
MONO%: 8.7 % (ref 0.0–14.0)
Platelets: 283 10*3/uL (ref 145–400)
RBC: 4.22 10*6/uL (ref 3.70–5.45)
RDW: 14.6 % — ABNORMAL HIGH (ref 11.2–14.5)

## 2010-09-25 LAB — COMPREHENSIVE METABOLIC PANEL
AST: 17 U/L (ref 0–37)
Albumin: 4 g/dL (ref 3.5–5.2)
Alkaline Phosphatase: 43 U/L (ref 39–117)
BUN: 22 mg/dL (ref 6–23)
Glucose, Bld: 94 mg/dL (ref 70–99)
Potassium: 3.7 mEq/L (ref 3.5–5.3)
Sodium: 140 mEq/L (ref 135–145)
Total Bilirubin: 0.2 mg/dL — ABNORMAL LOW (ref 0.3–1.2)
Total Protein: 7.7 g/dL (ref 6.0–8.3)

## 2010-10-17 ENCOUNTER — Encounter (HOSPITAL_BASED_OUTPATIENT_CLINIC_OR_DEPARTMENT_OTHER): Payer: PRIVATE HEALTH INSURANCE | Admitting: Oncology

## 2010-11-22 LAB — HEMOGLOBIN A1C: Hgb A1c MFr Bld: 5.7 % (ref 4.0–6.0)

## 2010-11-22 LAB — CBC AND DIFFERENTIAL
Hemoglobin: 12 g/dL (ref 12.0–16.0)
Platelets: 324 10*3/uL (ref 150–399)
WBC: 7.8 10^3/mL

## 2010-11-25 LAB — BASIC METABOLIC PANEL
CO2: 27
Chloride: 106
Creatinine, Ser: 0.89
GFR calc Af Amer: >60
Glucose, Bld: 112 — ABNORMAL HIGH

## 2010-11-27 LAB — DIFFERENTIAL
Basophils Absolute: 0
Lymphocytes Relative: 32
Neutro Abs: 4.8
Neutrophils Relative %: 59

## 2010-11-27 LAB — POCT I-STAT, CHEM 8
Calcium, Ion: 1.17
Chloride: 104
HCT: 45
Potassium: 3.4 — ABNORMAL LOW

## 2010-11-27 LAB — CBC
HCT: 41.2
MCV: 86.1
Platelets: 394
RBC: 4.78
WBC: 8.1

## 2010-11-27 LAB — LACTATE DEHYDROGENASE: LDH: 159

## 2010-11-27 LAB — COMPREHENSIVE METABOLIC PANEL
BUN: 18
CO2: 25
Chloride: 101
Creatinine, Ser: 0.86
GFR calc non Af Amer: >60
Total Bilirubin: 0.6

## 2011-02-08 ENCOUNTER — Telehealth: Payer: Self-pay | Admitting: *Deleted

## 2011-02-08 NOTE — Telephone Encounter (Signed)
patient confirmed over the phone the new date and time of the appointments in 03-2011

## 2011-03-09 ENCOUNTER — Encounter: Payer: Self-pay | Admitting: Family Medicine

## 2011-03-09 DIAGNOSIS — I10 Essential (primary) hypertension: Secondary | ICD-10-CM | POA: Insufficient documentation

## 2011-03-09 DIAGNOSIS — E785 Hyperlipidemia, unspecified: Secondary | ICD-10-CM

## 2011-03-28 ENCOUNTER — Ambulatory Visit: Payer: Self-pay | Admitting: Family Medicine

## 2011-04-03 ENCOUNTER — Ambulatory Visit (INDEPENDENT_AMBULATORY_CARE_PROVIDER_SITE_OTHER): Payer: PRIVATE HEALTH INSURANCE | Admitting: Family Medicine

## 2011-04-03 ENCOUNTER — Other Ambulatory Visit: Payer: Self-pay | Admitting: Oncology

## 2011-04-03 ENCOUNTER — Encounter: Payer: Self-pay | Admitting: Family Medicine

## 2011-04-03 VITALS — BP 140/88 | HR 79 | Temp 98.4°F | Resp 16 | Ht 61.0 in | Wt 178.0 lb

## 2011-04-03 DIAGNOSIS — I10 Essential (primary) hypertension: Secondary | ICD-10-CM

## 2011-04-03 DIAGNOSIS — E785 Hyperlipidemia, unspecified: Secondary | ICD-10-CM

## 2011-04-03 DIAGNOSIS — C50919 Malignant neoplasm of unspecified site of unspecified female breast: Secondary | ICD-10-CM

## 2011-04-03 DIAGNOSIS — E669 Obesity, unspecified: Secondary | ICD-10-CM | POA: Insufficient documentation

## 2011-04-03 MED ORDER — PRAVASTATIN SODIUM 40 MG PO TABS: 40.0000 mg | ORAL_TABLET | Freq: Every day | ORAL | Status: DC

## 2011-04-03 MED ORDER — AMLODIPINE BESYLATE 5 MG PO TABS: 5.0000 mg | ORAL_TABLET | Freq: Every day | ORAL | Status: DC

## 2011-04-03 NOTE — Progress Notes (Signed)
  Subjective:    Patient ID: Ellen [name], female    DOB: Apr 08, 1950, 61 y.o.   MRN: [id number]  HPI    This 61 y.o. AA female is her for med RFs and needs clarification as to why the statin was changed   from Simvastatin (which caused an allergic reaction) to Pravastatin which she is tolerating without  problems. She denies any persistent headache , dizziness, chest pain or palpitations and is compliant with   the BP meds.    Review of Systems As per HPI; otherwise Negative.    Objective:   Physical Exam  Constitutional: She is oriented to person, place, and time. She appears well-developed and well-nourished. No distress.  HENT:  Head: Normocephalic and atraumatic.  Cardiovascular: Normal rate.   Pulmonary/Chest: Effort normal. No respiratory distress.  Neurological: She is alert and oriented to person, place, and time. No cranial nerve deficit.  Psychiatric: She has a normal mood and affect. Her behavior is normal.     Reviewed most recent LABS: 11/22/2010    Cr= 0.76       CBC = Normal     A1c= 5.7%                                                  07/25/2010    T Chol= 168    TGs= 131    HDL= 52    LDL= 90                                                       Assessment & Plan:   1. Hypertension                          Continue current medications; RTC in 3 months  2. Hyperlipidemia                           Continue Pravastatin; notify clinic if side effects occur. Fasting                                                     Labs with next visit in 3 months.

## 2011-04-04 ENCOUNTER — Other Ambulatory Visit (HOSPITAL_BASED_OUTPATIENT_CLINIC_OR_DEPARTMENT_OTHER): Payer: PRIVATE HEALTH INSURANCE | Admitting: Lab

## 2011-04-04 DIAGNOSIS — C50919 Malignant neoplasm of unspecified site of unspecified female breast: Secondary | ICD-10-CM

## 2011-04-04 LAB — COMPREHENSIVE METABOLIC PANEL
ALT: 13 U/L (ref 0–35)
AST: 15 U/L (ref 0–37)
Albumin: 3.9 g/dL (ref 3.5–5.2)
CO2: 24 mEq/L (ref 19–32)
Calcium: 9.2 mg/dL (ref 8.4–10.5)
Chloride: 106 mEq/L (ref 96–112)
Potassium: 4 mEq/L (ref 3.5–5.3)
Total Protein: 6.6 g/dL (ref 6.0–8.3)

## 2011-04-04 LAB — CBC WITH DIFFERENTIAL/PLATELET
BASO%: 0.6 % (ref 0.0–2.0)
EOS%: 1.8 % (ref 0.0–7.0)
HCT: 37.6 % (ref 34.8–46.6)
MCV: 85.4 fL (ref 79.5–101.0)
MONO#: 0.6 10*3/uL (ref 0.1–0.9)
NEUT#: 2.7 10*3/uL (ref 1.5–6.5)
Platelets: 296 10*3/uL (ref 145–400)
RBC: 4.41 10*6/uL (ref 3.70–5.45)

## 2011-04-11 ENCOUNTER — Ambulatory Visit (HOSPITAL_BASED_OUTPATIENT_CLINIC_OR_DEPARTMENT_OTHER): Payer: PRIVATE HEALTH INSURANCE | Admitting: Oncology

## 2011-04-11 ENCOUNTER — Telehealth: Payer: Self-pay | Admitting: Oncology

## 2011-04-11 VITALS — BP 137/84 | HR 90 | Temp 98.7°F | Ht 61.0 in | Wt 178.6 lb

## 2011-04-11 DIAGNOSIS — E559 Vitamin D deficiency, unspecified: Secondary | ICD-10-CM

## 2011-04-11 DIAGNOSIS — C50919 Malignant neoplasm of unspecified site of unspecified female breast: Secondary | ICD-10-CM

## 2011-04-11 DIAGNOSIS — Z170 Estrogen receptor positive status [ER+]: Secondary | ICD-10-CM

## 2011-04-11 NOTE — Patient Instructions (Signed)
    Take 2000 units of vitamin D3 daily, available over the counter.Marland Kitchen

## 2011-04-11 NOTE — Telephone Encounter (Signed)
gve the pt her aug 2013 appt calendar 

## 2011-04-11 NOTE — Progress Notes (Signed)
Hematology and Oncology Follow Up Visit  Ellen [name] [id number] [birth year] 61 y.o. 04/11/2011 2:10 PM   DIAGNOSIS:  61 year old woman with history of T1 C. N1, ER/PR positive breast cancer status post lumpectomy radiation therapy completed 03/20/1998 and on tamoxifen until August 2012 currently on Arimidex.  Encounter Diagnoses  Name Primary?  . Breast cancer Yes  . Unspecified vitamin D deficiency      PAST THERAPY:    Interim History:  Patient is doing well. She was started on Arimidex and tolerates this well. She denies joint pain hot flashes. Appetite good weight is stable. Her last mammogram was performed in the summer. She is taking some calcium and vitamin D.  Medications: I have reviewed the patient's current medications.  Allergies:  Allergies  Allergen Reactions  . Ace Inhibitors   . Simvastatin Swelling    lips    Past Medical History, Surgical history, Social history, and Family History were reviewed and updated.  Review of Systems: Constitutional:  Negative for fever, chills, night sweats, anorexia, weight loss, pain. Cardiovascular: no chest pain or dyspnea on exertion Respiratory: no cough, shortness of breath, or wheezing Neurological: negative Dermatological: negative ENT: negative Skin Gastrointestinal: no abdominal pain, change in bowel habits, or black or bloody stools Genito-Urinary: negative Hematological and Lymphatic: negative Breast: negative Musculoskeletal: negative Remaining ROS negative.  Physical Exam:  Blood pressure 137/84, pulse 90, temperature 98.7 F (37.1 C), temperature source Oral, height 5\' 1"  (1.549 m), weight 178 lb 9.6 oz (81.012 kg).  ECOG: 0   General appearance: alert, cooperative and appears stated age Throat: lips, mucosa, and tongue normal; teeth and gums normal Resp: clear to auscultation bilaterally and normal percussion bilaterally Breasts: normal appearance, no masses or tenderness Cardio: regular rate  and rhythm, S1, S2 normal, no murmur, click, rub or gallop and normal apical impulse GI: soft, non-tender; bowel sounds normal; no masses,  no organomegaly Extremities: extremities normal, atraumatic, no cyanosis or edema Lymph nodes: Cervical, supraclavicular, and axillary nodes normal. Neurologic: Grossly normal   Lab Results: Lab Results  Component Value Date   WBC 5.0 04/04/2011   HGB 12.8 04/04/2011   HCT 37.6 04/04/2011   MCV 85.4 04/04/2011   PLT 296 04/04/2011     Chemistry      Component Value Date/Time   NA 139 04/04/2011 1012   K 4.0 04/04/2011 1012   CL 106 04/04/2011 1012   CO2 24 04/04/2011 1012   BUN 16 04/04/2011 1012   CREATININE 0.79 04/04/2011 1012   CREATININE 0.8 07/25/2010   GLU 89 07/25/2010      Component Value Date/Time   CALCIUM 9.2 04/04/2011 1012   ALKPHOS 52 04/04/2011 1012   AST 15 04/04/2011 1012   ALT 13 04/04/2011 1012   BILITOT 0.3 04/04/2011 1012       Radiological Studies:  No results found.   IMPRESSIONS AND PLAN: A 61 y.o. female with  History of node positive, ER/PR positive breast cancer status post lumpectomy radiation therapy and currently on Arimidex.  Patient is doing well I will see her in 6 months time for appropriate imaging studies. I've encouraged her to additional vitamin D 2000 today  Spent more than half the time coordinating care.    Ellen [name] 2/15/20132:10 PM

## 2011-05-12 ENCOUNTER — Telehealth: Payer: Self-pay | Admitting: Internal Medicine

## 2011-05-12 MED ORDER — ALENDRONATE SODIUM 70 MG PO TABS: 70.0000 mg | ORAL_TABLET | ORAL | Status: DC

## 2011-05-12 NOTE — Telephone Encounter (Signed)
Fosamax RF for one month

## 2011-07-14 ENCOUNTER — Other Ambulatory Visit: Payer: Self-pay

## 2011-07-14 MED ORDER — ALENDRONATE SODIUM 70 MG PO TABS: 70.0000 mg | ORAL_TABLET | ORAL | Status: DC

## 2011-07-15 ENCOUNTER — Encounter (HOSPITAL_COMMUNITY): Payer: Self-pay

## 2011-07-15 ENCOUNTER — Emergency Department (HOSPITAL_COMMUNITY)
Admission: EM | Admit: 2011-07-15 | Discharge: 2011-07-15 | Disposition: A | Discharge status: Home / Self Care | Payer: PRIVATE HEALTH INSURANCE | Attending: Emergency Medicine | Admitting: Emergency Medicine

## 2011-07-15 DIAGNOSIS — L03039 Cellulitis of unspecified toe: Secondary | ICD-10-CM | POA: Insufficient documentation

## 2011-07-15 DIAGNOSIS — Z9010 Acquired absence of unspecified breast and nipple: Secondary | ICD-10-CM | POA: Insufficient documentation

## 2011-07-15 DIAGNOSIS — Z853 Personal history of malignant neoplasm of breast: Secondary | ICD-10-CM | POA: Insufficient documentation

## 2011-07-15 DIAGNOSIS — M79609 Pain in unspecified limb: Secondary | ICD-10-CM | POA: Insufficient documentation

## 2011-07-15 DIAGNOSIS — I10 Essential (primary) hypertension: Secondary | ICD-10-CM | POA: Insufficient documentation

## 2011-07-15 DIAGNOSIS — IMO0002 Reserved for concepts with insufficient information to code with codable children: Secondary | ICD-10-CM

## 2011-07-15 MED ORDER — CEPHALEXIN 500 MG PO CAPS: 500.0000 mg | ORAL_CAPSULE | Freq: Four times a day (QID) | ORAL | Status: DC

## 2011-07-15 NOTE — Discharge Instructions (Signed)
Take the antibiotics as directed - you may still loose the nail.  Follow up with Dr. Audria Nine next week to make sure you are improving.

## 2011-07-15 NOTE — ED Provider Notes (Signed)
Medical screening examination/treatment/procedure(s) were performed by non-physician practitioner and as supervising physician I was immediately available for consultation/collaboration.  Jasani Lengel R. Kiondra Caicedo, MD 07/15/11 2324 

## 2011-07-15 NOTE — ED Notes (Signed)
Pt complains of a right big toe infection, she states theres is drainage from the toe

## 2011-07-15 NOTE — ED Provider Notes (Signed)
History     CSN: [id number]  Arrival date & time 07/15/11  2050   First MD Initiated Contact with Patient 07/15/11 2209      Chief Complaint  Patient presents with  . Nail Problem    (Consider location/radiation/quality/duration/timing/severity/associated sxs/prior treatment) HPI Comments: Patient here because today she noted drainage from under her right great toe - she states that about 4 days ago she developed pain to the lateral border of the nail, she thought this was an ingrown toe nail so she cut it off - she states that the pain went away but today she noted purulent drainage from underneath the nail - she denies pain to the area, redness, fever, chills, red streaks up her foot.  Patient is a 61 y.o. female presenting with toe pain. The history is provided by the patient. No language interpreter was used.  Toe Pain This is a new problem. The current episode started in the past 7 days. The problem occurs constantly. The problem has been unchanged. Associated symptoms include arthralgias. Pertinent negatives include no abdominal pain, anorexia, change in bowel habit, chest pain, chills, congestion, coughing, diaphoresis, fatigue, fever, headaches, joint swelling, myalgias, nausea, neck pain, numbness, rash, sore throat, swollen glands, urinary symptoms, vertigo, visual change, vomiting or weakness. The symptoms are aggravated by nothing. She has tried nothing for the symptoms. The treatment provided no relief.    Past Medical History  Diagnosis Date  . HTN (hypertension)   . Breast CA 10/2007    S/P Surgery and Radiation Tx completed 03/20/08  . Thyroid goiter 03/2010    FNA of Left Thyroid Nodule - 05/14/2010  . Hyperlipidemia     Past Surgical History  Procedure Date  . Abdominal hysterectomy 1986    Heavy bleeding  . Breast surgery 11/08/2007    Left partial mastectomy (lumpectomy)  . Breast biopsy 12/01/2007    Left axillary sentinal node bx : positive    Family  History  Problem Relation Age of Onset  . Cancer Father     Died at 36 of Liver CA  . Diabetes Father     History  Substance Use Topics  . Smoking status: Former Smoker    Types: Cigarettes    Quit date: 02/25/2007  . Smokeless tobacco: Not on file  . Alcohol Use: No    OB History    Grav Para Term Preterm Abortions TAB SAB Ect Mult Living                  Review of Systems  Constitutional: Negative for fever, chills, diaphoresis and fatigue.  HENT: Negative for congestion, sore throat and neck pain.   Respiratory: Negative for cough.   Cardiovascular: Negative for chest pain.  Gastrointestinal: Negative for nausea, vomiting, abdominal pain, anorexia and change in bowel habit.  Musculoskeletal: Positive for arthralgias. Negative for myalgias and joint swelling.  Skin: Negative for rash.  Neurological: Negative for vertigo, weakness, numbness and headaches.  All other systems reviewed and are negative.    Allergies  Ace inhibitors and Simvastatin  Home Medications   Current Outpatient Rx  Name Route Sig Dispense Refill  . ALENDRONATE SODIUM 70 MG PO TABS Oral Take 70 mg by mouth every 7 (seven) days. Saturday.Take with a full glass of water on an empty stomach.    . AMLODIPINE BESYLATE 5 MG PO TABS Oral Take 1 tablet (5 mg total) by mouth daily. 30 tablet 5  . ANASTROZOLE 1 MG PO TABS Oral Take  1 mg by mouth daily.    Marland Kitchen CALCIUM CARBONATE-VITAMIN D 500-200 MG-UNIT PO TABS Oral Take 1 tablet by mouth 2 (two) times daily.    Marland Kitchen VITAMIN D 1000 UNITS PO TABS Oral Take 1,000 Units by mouth daily.    Marland Kitchen PRAVASTATIN SODIUM 40 MG PO TABS Oral Take 1 tablet (40 mg total) by mouth daily. 30 tablet 5  . TAMOXIFEN CITRATE 20 MG PO TABS Oral Take 20 mg by mouth daily.      BP 178/91  Pulse 84  Temp(Src) 98.5 F (36.9 C) (Oral)  Resp 18  Wt 182 lb (82.555 kg)  SpO2 99%  Physical Exam  Nursing note and vitals reviewed. Constitutional: She is oriented to person, place, and  time. She appears well-developed and well-nourished. No distress.  HENT:  Head: Normocephalic and atraumatic.  Right Ear: External ear normal.  Left Ear: External ear normal.  Nose: Nose normal.  Mouth/Throat: Oropharynx is clear and moist. No oropharyngeal exudate.  Eyes: Conjunctivae are normal. Pupils are equal, round, and reactive to light. No scleral icterus.  Neck: Normal range of motion. Neck supple.  Cardiovascular: Normal rate, regular rhythm and normal heart sounds.  Exam reveals no gallop and no friction rub.   No murmur heard. Pulmonary/Chest: Effort normal and breath sounds normal. No respiratory distress. She has no wheezes.  Abdominal: Soft. Bowel sounds are normal. She exhibits no distension.  Musculoskeletal: Normal range of motion. She exhibits no edema and no tenderness.  Lymphadenopathy:    She has no cervical adenopathy.  Neurological: She is alert and oriented to person, place, and time. No cranial nerve deficit.  Skin: Skin is warm and dry. No rash noted. No cyanosis or erythema. No pallor. Nails show no clubbing.       Purulent drainage noted from underneath right great toe, no evidence of paronychia, slight looseness of nail from nail bed at the distal tip.  Psychiatric: She has a normal mood and affect. Her behavior is normal. Judgment and thought content normal.    ED Course  Procedures (including critical care time)  Labs Reviewed - No data to display No results found.   Nail infection    MDM  Patient here with right great toe nail infection - doubt fungal infection - will place on short course of abx and will follow up with PCP        Scarlette Calico C. [place], [place] 07/15/11 2251

## 2011-07-24 ENCOUNTER — Ambulatory Visit (INDEPENDENT_AMBULATORY_CARE_PROVIDER_SITE_OTHER): Payer: PRIVATE HEALTH INSURANCE | Admitting: Family Medicine

## 2011-07-24 ENCOUNTER — Ambulatory Visit: Payer: PRIVATE HEALTH INSURANCE | Admitting: Family Medicine

## 2011-07-24 VITALS — BP 157/93 | HR 70 | Temp 98.4°F | Resp 18 | Ht 62.0 in | Wt 176.8 lb

## 2011-07-24 DIAGNOSIS — IMO0002 Reserved for concepts with insufficient information to code with codable children: Secondary | ICD-10-CM

## 2011-07-24 MED ORDER — CEPHALEXIN 500 MG PO CAPS: 500.0000 mg | ORAL_CAPSULE | Freq: Four times a day (QID) | ORAL | Status: AC

## 2011-07-24 NOTE — Progress Notes (Signed)
  Subjective:    Patient ID: Ellen [name], female    DOB: [birth year], 61 y.o.   MRN: [id number]  HPI Pt here for follow up of R great toe nail bed infection.  Was evaluated in ED on 07/15/11 Placed on keflex.  Swelling and redness improved.  Initial purulent drainage improved.  Still with some purulent drainage.  No fevers, chills.  Non diabetic.     Review of Systems See HPI, otherwise ROS negative     Objective:   Physical Exam  Musculoskeletal:       Feet:   Gen: in bed, NAD HEENT: NCAT, EOMI  CV: RRR, no murmurs PULM: CTAB, no wheezes        Assessment & Plan:  Will restart abx. Follow up with PCP in 3-5 days. Infectious red flags reviewed. Instructions given.     The patient and/or caregiver has been counseled thoroughly with regard to treatment plan and/or medications prescribed including dosage, schedule, interactions, rationale for use, and possible side effects and they verbalize understanding. Diagnoses and expected course of recovery discussed and will return if not improved as expected or if the condition worsens. Patient and/or caregiver verbalized understanding.

## 2011-07-24 NOTE — Patient Instructions (Signed)
It was good to see you today  I have continued your antibiotics.  Be sure to follow up with Dr. Audria Nine early next week.  Go to the ED if your symptoms worsen.

## 2011-07-24 NOTE — Progress Notes (Signed)
  Subjective:    Patient ID: Ellen [name], female    DOB: 1950-10-09, 61 y.o.   MRN: [id number]  HPI    Review of Systems     Objective:   Physical Exam        Assessment & Plan:  Reviewed.  Agree with assessment and plan.

## 2011-09-15 ENCOUNTER — Telehealth: Payer: Self-pay | Admitting: *Deleted

## 2011-09-15 NOTE — Telephone Encounter (Signed)
Patient called reporting she does not yet have an appt, for her annual mammogram.  Instructed to call the Breast center at corner of [address] [facility]. To schedule this to be done before the 10-09-2011 f/u.  Message left for scheduler to alert them if pt. Unable to schedule the diagnostic mammography appointment.

## 2011-09-25 ENCOUNTER — Ambulatory Visit
Admission: RE | Admit: 2011-09-25 | Discharge: 2011-09-25 | Disposition: A | Discharge status: Home / Self Care | Payer: PRIVATE HEALTH INSURANCE | Source: Ambulatory Visit | Attending: Oncology | Admitting: Oncology

## 2011-09-25 DIAGNOSIS — E559 Vitamin D deficiency, unspecified: Secondary | ICD-10-CM

## 2011-09-25 DIAGNOSIS — C50919 Malignant neoplasm of unspecified site of unspecified female breast: Secondary | ICD-10-CM

## 2011-10-02 ENCOUNTER — Other Ambulatory Visit (HOSPITAL_BASED_OUTPATIENT_CLINIC_OR_DEPARTMENT_OTHER): Payer: PRIVATE HEALTH INSURANCE | Admitting: Lab

## 2011-10-02 DIAGNOSIS — C50919 Malignant neoplasm of unspecified site of unspecified female breast: Secondary | ICD-10-CM

## 2011-10-02 DIAGNOSIS — E559 Vitamin D deficiency, unspecified: Secondary | ICD-10-CM

## 2011-10-02 LAB — CBC WITH DIFFERENTIAL/PLATELET
BASO%: 0.5 % (ref 0.0–2.0)
Basophils Absolute: 0 10*3/uL (ref 0.0–0.1)
EOS%: 1.7 % (ref 0.0–7.0)
MCH: 28.4 pg (ref 25.1–34.0)
MCHC: 33 g/dL (ref 31.5–36.0)
MCV: 86.2 fL (ref 79.5–101.0)
MONO%: 10.6 % (ref 0.0–14.0)
RBC: 4.5 10*6/uL (ref 3.70–5.45)
RDW: 14.4 % (ref 11.2–14.5)

## 2011-10-02 LAB — COMPREHENSIVE METABOLIC PANEL
AST: 16 U/L (ref 0–37)
Albumin: 3.9 g/dL (ref 3.5–5.2)
Alkaline Phosphatase: 56 U/L (ref 39–117)
BUN: 16 mg/dL (ref 6–23)
Potassium: 4 mEq/L (ref 3.5–5.3)

## 2011-10-08 ENCOUNTER — Telehealth: Payer: Self-pay | Admitting: *Deleted

## 2011-10-08 NOTE — Telephone Encounter (Signed)
due to md on vacation patient confirmed over the phone the new date and time for 10-20-2011

## 2011-10-09 ENCOUNTER — Ambulatory Visit: Payer: PRIVATE HEALTH INSURANCE | Admitting: Oncology

## 2011-10-20 ENCOUNTER — Ambulatory Visit: Payer: PRIVATE HEALTH INSURANCE | Admitting: Oncology

## 2011-10-28 ENCOUNTER — Telehealth: Payer: Self-pay | Admitting: *Deleted

## 2011-10-28 NOTE — Telephone Encounter (Signed)
per missed appointment for 09-2011 patient can only come after 4:00pm she gets off at 4:00pm with arrival here 4:15pm to see dr.rubin

## 2011-10-29 ENCOUNTER — Ambulatory Visit: Payer: PRIVATE HEALTH INSURANCE | Admitting: Oncology

## 2011-11-03 ENCOUNTER — Ambulatory Visit (HOSPITAL_BASED_OUTPATIENT_CLINIC_OR_DEPARTMENT_OTHER): Payer: PRIVATE HEALTH INSURANCE | Admitting: Family

## 2011-11-03 VITALS — BP 147/90 | HR 85 | Temp 99.2°F | Resp 20 | Ht 62.0 in | Wt 181.4 lb

## 2011-11-03 DIAGNOSIS — Z170 Estrogen receptor positive status [ER+]: Secondary | ICD-10-CM

## 2011-11-03 DIAGNOSIS — D0590 Unspecified type of carcinoma in situ of unspecified breast: Secondary | ICD-10-CM

## 2011-11-03 DIAGNOSIS — Z853 Personal history of malignant neoplasm of breast: Secondary | ICD-10-CM

## 2011-11-03 MED ORDER — DICLOFENAC SODIUM 1 % TD GEL: 2.0000 g | Freq: Four times a day (QID) | TRANSDERMAL | Status: DC

## 2011-11-04 ENCOUNTER — Encounter: Payer: Self-pay | Admitting: Family

## 2011-11-04 ENCOUNTER — Ambulatory Visit (INDEPENDENT_AMBULATORY_CARE_PROVIDER_SITE_OTHER): Payer: PRIVATE HEALTH INSURANCE | Admitting: Family Medicine

## 2011-11-04 ENCOUNTER — Encounter: Payer: Self-pay | Admitting: Family Medicine

## 2011-11-04 ENCOUNTER — Telehealth: Payer: Self-pay | Admitting: *Deleted

## 2011-11-04 VITALS — BP 135/85 | HR 80 | Temp 98.0°F | Resp 16 | Ht 62.0 in | Wt 182.0 lb

## 2011-11-04 DIAGNOSIS — Z87891 Personal history of nicotine dependence: Secondary | ICD-10-CM

## 2011-11-04 DIAGNOSIS — C50412 Malignant neoplasm of upper-outer quadrant of left female breast: Secondary | ICD-10-CM | POA: Insufficient documentation

## 2011-11-04 DIAGNOSIS — I10 Essential (primary) hypertension: Secondary | ICD-10-CM

## 2011-11-04 MED ORDER — AMLODIPINE BESYLATE 5 MG PO TABS: 5.0000 mg | ORAL_TABLET | Freq: Every day | ORAL | Status: DC

## 2011-11-04 MED ORDER — PRAVASTATIN SODIUM 40 MG PO TABS: 40.0000 mg | ORAL_TABLET | Freq: Every day | ORAL | Status: DC

## 2011-11-04 NOTE — Telephone Encounter (Signed)
Return in 6 months with PR  No labs needed per the orders   Left voice message to inform the patient of the new date and time

## 2011-11-04 NOTE — Patient Instructions (Addendum)
Hypertriglyceridemia  Diet for High blood levels of Triglycerides Most fats in food are triglycerides. Triglycerides in your blood are stored as fat in your body. High levels of triglycerides in your blood may put you at a greater risk for heart disease and stroke.  Normal triglyceride levels are less than 150 mg/dL. Borderline high levels are 150-199 mg/dl. High levels are 200 - 499 mg/dL, and very high triglyceride levels are greater than 500 mg/dL. The decision to treat high triglycerides is generally based on the level. For people with borderline or high triglyceride levels, treatment includes weight loss and exercise. Drugs are recommended for people with very high triglyceride levels. Many people who need treatment for high triglyceride levels have metabolic syndrome. This syndrome is a collection of disorders that often include: insulin resistance, high blood pressure, blood clotting problems, high cholesterol and triglycerides. TESTING PROCEDURE FOR TRIGLYCERIDES  You should not eat 4 hours before getting your triglycerides measured. The normal range of triglycerides is between 10 and 250 milligrams per deciliter (mg/dl). Some people may have extreme levels (1000 or above), but your triglyceride level may be too high if it is above 150 mg/dl, depending on what other risk factors you have for heart disease.   People with high blood triglycerides may also have high blood cholesterol levels. If you have high blood cholesterol as well as high blood triglycerides, your risk for heart disease is probably greater than if you only had high triglycerides. High blood cholesterol is one of the main risk factors for heart disease.  CHANGING YOUR DIET  Your weight can affect your blood triglyceride level. If you are more than 20% above your ideal body weight, you may be able to lower your blood triglycerides by losing weight. Eating less and exercising regularly is the best way to combat this. Fat provides  more calories than any other food. The best way to lose weight is to eat less fat. Only 30% of your total calories should come from fat. Less than 7% of your diet should come from saturated fat. A diet low in fat and saturated fat is the same as a diet to decrease blood cholesterol. By eating a diet lower in fat, you may lose weight, lower your blood cholesterol, and lower your blood triglyceride level.  Eating a diet low in fat, especially saturated fat, may also help you lower your blood triglyceride level. Ask your dietitian to help you figure how much fat you can eat based on the number of calories your caregiver has prescribed for you.  Exercise, in addition to helping with weight loss may also help lower triglyceride levels.   Alcohol can increase blood triglycerides. You may need to stop drinking alcoholic beverages.   Too much carbohydrate in your diet may also increase your blood triglycerides. Some complex carbohydrates are necessary in your diet. These may include bread, rice, potatoes, other starchy vegetables and cereals.   Reduce "simple" carbohydrates. These may include pure sugars, candy, honey, and jelly without losing other nutrients. If you have the kind of high blood triglycerides that is affected by the amount of carbohydrates in your diet, you will need to eat less sugar and less high-sugar foods. Your caregiver can help you with this.   Adding 2-4 grams of fish oil (EPA+ DHA) may also help lower triglycerides. Speak with your caregiver before adding any supplements to your regimen.  Following the Diet  Maintain your ideal weight. Your caregivers can help you with a diet. Generally,   eating less food and getting more exercise will help you lose weight. Joining a weight control group may also help. Ask your caregivers for a good weight control group in your area.  Eat low-fat foods instead of high-fat foods. This can help you lose weight too.  These foods are lower in fat. Eat MORE  of these:   Dried beans, peas, and lentils.   Egg whites.   Low-fat cottage cheese.   Fish.   Lean cuts of meat, such as round, sirloin, rump, and flank (cut extra fat off meat you fix).   Whole grain breads, cereals and pasta.   Skim and nonfat dry milk.   Low-fat yogurt.   Poultry without the skin.   Cheese made with skim or part-skim milk, such as mozzarella, parmesan, farmers', ricotta, or pot cheese.  These are higher fat foods. Eat LESS of these:   Whole milk and foods made from whole milk, such as American, blue, cheddar, monterey jack, and swiss cheese   High-fat meats, such as luncheon meats, sausages, knockwurst, bratwurst, hot dogs, ribs, corned beef, ground pork, and regular ground beef.   Fried foods.  Limit saturated fats in your diet. Substituting unsaturated fat for saturated fat may decrease your blood triglyceride level. You will need to read package labels to know which products contain saturated fats.  These foods are high in saturated fat. Eat LESS of these:   Fried pork skins.   Whole milk.   Skin and fat from poultry.   Palm oil.   Butter.   Shortening.   Cream cheese.   Bacon.   Margarines and baked goods made from listed oils.   Vegetable shortenings.   Chitterlings.   Fat from meats.   Coconut oil.   Palm kernel oil.   Lard.   Cream.   Sour cream.   Fatback.   Coffee whiteners and non-dairy creamers made with these oils.   Cheese made from whole milk.  Use unsaturated fats (both polyunsaturated and monounsaturated) moderately. Remember, even though unsaturated fats are better than saturated fats; you still want a diet low in total fat.  These foods are high in unsaturated fat:   Canola oil.   Sunflower oil.   Mayonnaise.   Almonds.   Peanuts.   Pine nuts.   Margarines made with these oils.   Safflower oil.   Olive oil.   Avocados.   Cashews.   Peanut butter.   Sunflower seeds.   Soybean oil.     Peanut oil.   Olives.   Pecans.   Walnuts.   Pumpkin seeds.  Avoid sugar and other high-sugar foods. This will decrease carbohydrates without decreasing other nutrients. Sugar in your food goes rapidly to your blood. When there is excess sugar in your blood, your liver may use it to make more triglycerides. Sugar also contains calories without other important nutrients.  Eat LESS of these:   Sugar, brown sugar, powdered sugar, jam, jelly, preserves, honey, syrup, molasses, pies, candy, cakes, cookies, frosting, pastries, colas, soft drinks, punches, fruit drinks, and regular gelatin.   Avoid alcohol. Alcohol, even more than sugar, may increase blood triglycerides. In addition, alcohol is high in calories and low in nutrients. Ask for sparkling water, or a diet soft drink instead of an alcoholic beverage.  Suggestions for planning and preparing meals   Bake, broil, grill or roast meats instead of frying.   Remove fat from meats and skin from poultry before cooking.   Add spices,   herbs, lemon juice or vinegar to vegetables instead of salt, rich sauces or gravies.   Use a non-stick skillet without fat or use no-stick sprays.   Cool and refrigerate stews and broth. Then remove the hardened fat floating on the surface before serving.   Refrigerate meat drippings and skim off fat to make low-fat gravies.   Serve more fish.   Use less butter, margarine and other high-fat spreads on bread or vegetables.   Use skim or reconstituted non-fat dry milk for cooking.   Cook with low-fat cheeses.   Substitute low-fat yogurt or cottage cheese for all or part of the sour cream in recipes for sauces, dips or congealed salads.   Use half yogurt/half mayonnaise in salad recipes.   Substitute evaporated skim milk for cream. Evaporated skim milk or reconstituted non-fat dry milk can be whipped and substituted for whipped cream in certain recipes.   Choose fresh fruits for dessert instead of  high-fat foods such as pies or cakes. Fruits are naturally low in fat.  When Dining Out   Order low-fat appetizers such as fruit or vegetable juice, pasta with vegetables or tomato sauce.   Select clear, rather than cream soups.   Ask that dressings and gravies be served on the side. Then use less of them.   Order foods that are baked, broiled, poached, steamed, stir-fried, or roasted.   Ask for margarine instead of butter, and use only a small amount.   Drink sparkling water, unsweetened tea or coffee, or diet soft drinks instead of alcohol or other sweet beverages.  QUESTIONS AND ANSWERS ABOUT OTHER FATS IN THE BLOOD: SATURATED FAT, TRANS FAT, AND CHOLESTEROL What is trans fat? Trans fat is a type of fat that is formed when vegetable oil is hardened through a process called hydrogenation. This process helps makes foods more solid, gives them shape, and prolongs their shelf life. Trans fats are also called hydrogenated or partially hydrogenated oils.  What do saturated fat, trans fat, and cholesterol in foods have to do with heart disease? Saturated fat, trans fat, and cholesterol in the diet all raise the level of LDL "bad" cholesterol in the blood. The higher the LDL cholesterol, the greater the risk for coronary heart disease (CHD). Saturated fat and trans fat raise LDL similarly.  What foods contain saturated fat, trans fat, and cholesterol? High amounts of saturated fat are found in animal products, such as fatty cuts of meat, chicken skin, and full-fat dairy products like butter, whole milk, cream, and cheese, and in tropical vegetable oils such as palm, palm kernel, and coconut oil. Trans fat is found in some of the same foods as saturated fat, such as vegetable shortening, some margarines (especially hard or stick margarine), crackers, cookies, baked goods, fried foods, salad dressings, and other processed foods made with partially hydrogenated vegetable oils. Small amounts of trans fat  also occur naturally in some animal products, such as milk products, beef, and lamb. Foods high in cholesterol include liver, other organ meats, egg yolks, shrimp, and full-fat dairy products. How can I use the new food label to make heart-healthy food choices? Check the Nutrition Facts panel of the food label. Choose foods lower in saturated fat, trans fat, and cholesterol. For saturated fat and cholesterol, you can also use the Percent Daily Value (%DV): 5% DV or less is low, and 20% DV or more is high. (There is no %DV for trans fat.) Use the Nutrition Facts panel to choose foods low in   saturated fat and cholesterol, and if the trans fat is not listed, read the ingredients and limit products that list shortening or hydrogenated or partially hydrogenated vegetable oil, which tend to be high in trans fat. POINTS TO REMEMBER: YOU NEED A LITTLE TLC (THERAPEUTIC LIFESTYLE CHANGES)  Discuss your risk for heart disease with your caregivers, and take steps to reduce risk factors.   Change your diet. Choose foods that are low in saturated fat, trans fat, and cholesterol.   Add exercise to your daily routine if it is not already being done. Participate in physical activity of moderate intensity, like brisk walking, for at least 30 minutes on most, and preferably all days of the week. No time? Break the 30 minutes into three, 10-minute segments during the day.   Stop smoking. If you do smoke, contact your caregiver to discuss ways in which they can help you quit.   Do not use street drugs.   Maintain a normal weight.   Maintain a healthy blood pressure.   Keep up with your blood work for checking the fats in your blood as directed by your caregiver.  Document Released: 11/29/2003 Document Revised: 01/30/2011 Document Reviewed: 06/26/2008 [employer] Patient Information 2012 [employer]. 

## 2011-11-04 NOTE — Progress Notes (Signed)
Hematology and Oncology Follow Up Visit  KATLYN MULDREW [id number] [birth year] 61 y.o. 11/04/2011 2:53 PM   DIAGNOSIS:  61 year old woman with history of T1 C. N1, ER/PR positive breast cancer status post lumpectomy radiation therapy completed 03/20/1998 and on tamoxifen until August 2012 currently on Arimidex.  Encounter Diagnosis  Name Primary?  . History of breast cancer Yes     PAST THERAPY:    Interim History:  Patient is doing well. Currently on Arimidex and tolerates this well with occasional hot flash, maybe one daily. She denies joint pain or vaginal dryness. No headache or blurred vision. No cough or shortness of breath. No abdominal pain or new bone pain. Bowel and bladder function are normal. Appetite is good, with adequate fluid intake. Remainder of the 10 point  review of systems is negative. Medications: I have reviewed the patient's current medications.  Allergies:  Allergies  Allergen Reactions  . Ace Inhibitors   . Simvastatin Swelling    lips    Past Medical History, Surgical history, Social history, and Family History were reviewed and updated.  Physical Exam: Blood pressure 147/90, pulse 85, temperature 99.2 F (37.3 C), temperature source Oral, resp. rate 20, height 5\' 2"  (1.575 m), weight 181 lb 6.4 oz (82.283 kg). ECOG: 0 General appearance: alert, cooperative and appears stated age Throat: lips, mucosa, and tongue normal; teeth and gums normal Resp: clear to auscultation bilaterally and normal percussion bilaterally Breasts: normal appearance, no masses or tenderness Cardio: regular rate and rhythm, S1, S2 normal, no murmur, click, rub or gallop and normal apical impulse GI: soft, non-tender; bowel sounds normal; no masses,  no organomegaly Extremities: extremities normal, atraumatic, no cyanosis or edema Lymph nodes: Cervical, supraclavicular, and axillary nodes normal. Neurologic: Grossly normal   Lab Results: Lab Results  Component Value Date     WBC 6.8 10/02/2011   HGB 12.8 10/02/2011   HCT 38.7 10/02/2011   MCV 86.2 10/02/2011   PLT 309 10/02/2011     Chemistry      Component Value Date/Time   NA 138 10/02/2011 1555   K 4.0 10/02/2011 1555   CL 102 10/02/2011 1555   CO2 27 10/02/2011 1555   BUN 16 10/02/2011 1555   CREATININE 0.71 10/02/2011 1555   CREATININE 0.8 07/25/2010   GLU 89 07/25/2010      Component Value Date/Time   CALCIUM 9.2 10/02/2011 1555   ALKPHOS 56 10/02/2011 1555   AST 16 10/02/2011 1555   ALT 14 10/02/2011 1555   BILITOT 0.2* 10/02/2011 1555        IMPRESSION: 61 y.o. female with: 1. History of node positive, ER/PR positive breast cancer status post lumpectomy radiation therapy and currently on Arimidex. No evidence of recurrence mammographically or clinically.   PLAN: 1. Return to see Dr. Donnie Coffin in 6 months 2. Mammo will be due Aug 2014. Bone density due Jan. 2014.  3. Remain on Arimidex.      Alyria Krack FNP-C 9/10/20132:53 PM

## 2011-11-04 NOTE — Patient Instructions (Addendum)
1. Return to see Dr. Donnie Coffin in 6 months 2. Mammo will be due Aug 2014. Bone density due Jan. 2014.  3. Remain on Arimidex.

## 2011-11-05 ENCOUNTER — Encounter: Payer: Self-pay | Admitting: Family Medicine

## 2011-11-05 DIAGNOSIS — Z87891 Personal history of nicotine dependence: Secondary | ICD-10-CM | POA: Insufficient documentation

## 2011-11-05 NOTE — Progress Notes (Signed)
S: This 61 y.o. AA female has HTN and returns for  follow-up and medication refills. Denies medication side effects. Denies CP or tightness, palpitations SOB or diaphoresis, HA, dizziness, lightheadedness, numbness, weakness or syncope.  ROS: As per HPI.  O:  Filed Vitals:   11/04/11 1627  BP: 135/85                                        Weight is up ~ 5 lbs  Pulse: 80  Temp: 98 F (36.7 C)  Resp: 16   GEN: In NAD; WN,WD. COR: RRR, no edema. LUNGS: Normal resp rate and effort. NEURO: A&O x 3; CNs intact, otherwise nonfocal.   A/P: 1. HTN (hypertension)   RF: Amlodipine 5 mg  #30   5 RFs Also RFs: Pravastatin 40 mg  # 30  5 RFs and Diclofenac Gel

## 2011-11-19 ENCOUNTER — Other Ambulatory Visit: Payer: Self-pay | Admitting: Oncology

## 2011-11-19 DIAGNOSIS — Z853 Personal history of malignant neoplasm of breast: Secondary | ICD-10-CM

## 2011-11-22 ENCOUNTER — Other Ambulatory Visit: Payer: Self-pay | Admitting: Oncology

## 2011-11-22 DIAGNOSIS — Z853 Personal history of malignant neoplasm of breast: Secondary | ICD-10-CM

## 2011-11-23 ENCOUNTER — Other Ambulatory Visit: Payer: Self-pay | Admitting: *Deleted

## 2011-11-23 MED ORDER — AMLODIPINE BESYLATE 5 MG PO TABS: 5.0000 mg | ORAL_TABLET | Freq: Every day | ORAL | Status: DC

## 2011-11-23 MED ORDER — PRAVASTATIN SODIUM 40 MG PO TABS: 40.0000 mg | ORAL_TABLET | Freq: Every day | ORAL | Status: DC

## 2012-03-29 ENCOUNTER — Telehealth: Payer: Self-pay | Admitting: *Deleted

## 2012-03-29 NOTE — Telephone Encounter (Signed)
Misty Stanley left a message for the pt on 03/27/12 to return our call so we can schedule a med onc appt.

## 2012-03-31 ENCOUNTER — Ambulatory Visit (INDEPENDENT_AMBULATORY_CARE_PROVIDER_SITE_OTHER): Payer: Managed Care, Other (non HMO) | Admitting: Family Medicine

## 2012-03-31 ENCOUNTER — Ambulatory Visit: Payer: Managed Care, Other (non HMO)

## 2012-03-31 ENCOUNTER — Encounter: Payer: Self-pay | Admitting: Oncology

## 2012-03-31 ENCOUNTER — Telehealth: Payer: Self-pay | Admitting: *Deleted

## 2012-03-31 VITALS — BP 149/91 | HR 78 | Temp 97.3°F | Resp 16 | Ht 62.0 in | Wt 183.0 lb

## 2012-03-31 DIAGNOSIS — M25552 Pain in left hip: Secondary | ICD-10-CM

## 2012-03-31 DIAGNOSIS — M76899 Other specified enthesopathies of unspecified lower limb, excluding foot: Secondary | ICD-10-CM

## 2012-03-31 DIAGNOSIS — M25559 Pain in unspecified hip: Secondary | ICD-10-CM

## 2012-03-31 DIAGNOSIS — M7062 Trochanteric bursitis, left hip: Secondary | ICD-10-CM

## 2012-03-31 MED ORDER — HYDROCODONE-ACETAMINOPHEN 5-500 MG PO TABS: 1.0000 | ORAL_TABLET | ORAL | Status: DC | PRN

## 2012-03-31 MED ORDER — DICLOFENAC SODIUM 75 MG PO TBEC: DELAYED_RELEASE_TABLET | ORAL | Status: DC

## 2012-03-31 NOTE — Telephone Encounter (Signed)
Pt called about her appt and I confirmed her for 05/06/12.  Mailed letter & calendar to pt.

## 2012-03-31 NOTE — Patient Instructions (Signed)
Hip Bursitis Bursitis is a swelling and soreness (inflammation) of a fluid-filled sac (bursa). This sac overlies and protects the joints.  CAUSES   Injury.  Overuse of the muscles surrounding the joint.  Arthritis.  Gout.  Infection.  Cold weather.  Inadequate warm-up and conditioning prior to activities. The cause may not be known.  SYMPTOMS   Mild to severe irritation.  Tenderness and swelling over the outside of the hip.  Pain with motion of the hip.  If the bursa becomes infected, a fever may be present. Redness, tenderness, and warmth will develop over the hip. Symptoms usually lessen in 3 to 4 weeks with treatment, but can come back. TREATMENT If conservative treatment does not work, your caregiver may advise draining the bursa and injecting cortisone into the area. This may speed up the healing process. This may also be used as an initial treatment of choice. HOME CARE INSTRUCTIONS   Apply ice to the affected area for 15 to 20 minutes every 3 to 4 hours while awake for the first 2 days. Put the ice in a plastic bag and place a towel between the bag of ice and your skin.  Rest the painful joint as much as possible, but continue to put the joint through a normal range of motion at least 4 times per day. When the pain lessens, begin normal, slow movements and usual activities to help prevent stiffness of the hip.  Only take over-the-counter or prescription medicines for pain, discomfort, or fever as directed by your caregiver.  Use crutches to limit weight bearing on the hip joint, if advised.  Elevate your painful hip to reduce swelling. Use pillows for propping and cushioning your legs and hips.  Gentle massage may provide comfort and decrease swelling. SEEK IMMEDIATE MEDICAL CARE IF:   Your pain increases even during treatment, or you are not improving.  You have a fever.  You have heat and inflammation over the involved bursa.  You have any other questions  or concerns. MAKE SURE YOU:   Understand these instructions.  Will watch your condition.  Will get help right away if you are not doing well or get worse. Document Released: 08/02/2001 Document Revised: 05/05/2011 Document Reviewed: 03/01/2008 [employer] Patient Information 2013 [employer].  

## 2012-03-31 NOTE — Progress Notes (Signed)
Subjective: For the past several weeks the patient has been having pain in her left lateral hip area going down her thigh and lateral calf. Knows of no major injury. 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 The latter gets much more physical exercise. The patient has been having pain mostly when she is laying down, especially if she rolls over on the left side. She has had a history of some bursitis in the past, but it is been a somewhat since she's had this. She knows of no specific injury.  Objective: Moderately overweight 62 year old lady who moves like someone who has some discomfort. Her abdomen was soft without mass or tenderness. Straight leg raise test negative. Deep tender reflexes 1+ symmetrical. Skin intact. She has tenderness on the lateral aspect of the left hip, from the lower aspect of the greater trochanter bursal region down. Range of motion seems fairly good.  Assessment: Left greater trochanteric pain and bilateral leg pain  Plan: X-ray left hip  [facility] reading (PRIMARY) by  Dr. Alwyn Ren Normal xary  .  Assessment: Greater trochanter bursitis  Plan: Discuss with the patient. She would for her to try the oral medication and see if she gets sufficient relief before undergoing an injection. If she keeps having pain or checked her greater trochanter.  Prescribed Lortab and diclofenac

## 2012-04-04 ENCOUNTER — Other Ambulatory Visit: Payer: Self-pay | Admitting: Family Medicine

## 2012-04-06 NOTE — Telephone Encounter (Signed)
Call:  If she is continuing to have that much pain needs to come back in for reassessment and possible an injection of cortisone, or refer her to orthopedics.

## 2012-04-07 NOTE — Telephone Encounter (Signed)
Called patient to advise. Left message for her to call back  

## 2012-04-07 NOTE — Telephone Encounter (Signed)
I have advised patient 

## 2012-05-06 ENCOUNTER — Ambulatory Visit (HOSPITAL_BASED_OUTPATIENT_CLINIC_OR_DEPARTMENT_OTHER): Payer: Managed Care, Other (non HMO) | Admitting: Lab

## 2012-05-06 ENCOUNTER — Ambulatory Visit (HOSPITAL_BASED_OUTPATIENT_CLINIC_OR_DEPARTMENT_OTHER): Payer: Managed Care, Other (non HMO) | Admitting: Family

## 2012-05-06 ENCOUNTER — Ambulatory Visit: Payer: PRIVATE HEALTH INSURANCE | Admitting: Oncology

## 2012-05-06 ENCOUNTER — Encounter: Payer: Self-pay | Admitting: Family

## 2012-05-06 VITALS — BP 146/87 | HR 70 | Temp 98.1°F | Ht 62.0 in | Wt 180.2 lb

## 2012-05-06 DIAGNOSIS — C50919 Malignant neoplasm of unspecified site of unspecified female breast: Secondary | ICD-10-CM

## 2012-05-06 LAB — CBC WITH DIFFERENTIAL/PLATELET
Basophils Absolute: 0.1 10*3/uL (ref 0.0–0.1)
EOS%: 1.6 % (ref 0.0–7.0)
HCT: 39.5 % (ref 34.8–46.6)
HGB: 13.3 g/dL (ref 11.6–15.9)
LYMPH%: 33.4 % (ref 14.0–49.7)
MCH: 28.1 pg (ref 25.1–34.0)
MCV: 83.7 fL (ref 79.5–101.0)
MONO%: 8.1 % (ref 0.0–14.0)
NEUT%: 56.2 % (ref 38.4–76.8)
Platelets: 308 10*3/uL (ref 145–400)

## 2012-05-06 LAB — COMPREHENSIVE METABOLIC PANEL (CC13)
Albumin: 4.1 g/dL (ref 3.5–5.0)
Alkaline Phosphatase: 73 U/L (ref 40–150)
BUN: 12.3 mg/dL (ref 7.0–26.0)
Creatinine: 0.8 mg/dL (ref 0.6–1.1)
Glucose: 91 mg/dl (ref 70–99)
Potassium: 3.6 mEq/L (ref 3.5–5.1)

## 2012-05-06 MED ORDER — ANASTROZOLE 1 MG PO TABS: 1.0000 mg | ORAL_TABLET | Freq: Every day | ORAL | Status: DC

## 2012-05-06 MED ORDER — ALENDRONATE SODIUM 70 MG PO TABS: 70.0000 mg | ORAL_TABLET | ORAL | Status: DC

## 2012-05-06 NOTE — Progress Notes (Signed)
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   PRIOR THERAPY: 1.The patient underwent a digital screening mammogram on 09/20/2007 in which additional evaluation of the left breast was indicated. No specific mammographic evidence of malignancy of the right breast.  2.The patient had a left breast ultrasound on 10/14/2007 which showed suspicious findings and recommended stereotactic biopsy of microcalcifications in the left breast at the 3 o'clock position.  The ultrasound also recommended a six-month follow-up for probable fibroadenoma in the left breast at the 9 o'clock position.  3. On 10/15/2007 the patient had a MRI of bilateral breasts which showed a 4.4 x 2.7 x 1.5 cm biopsy proven ductal carcinoma in situ at the 3 o'clock position of the left breast.  The patient also had multiple bilateral fibroadenomas that do not require follow-up. The MRI also showed to liver cysts.  4.The patient underwent a  lumpectomy on 11/08/2007 for a T1b N58mi invasive ductal carcinoma with DCIS, grade 2, with 1/1 micrometastasis lymph node,  ER 100%, PR 100%, Ki-67 10%, HER2/neu no amplification.  5. Oncotype score of 10 with an average rate of distant recurrence at 7%.  6.  Status post radiation therapy from 01/24/2008 through 03/20/2008.  7.  The patient started anti-estrogen therapy with Tamoxifen from 02/2008 through 09/2010.The patient was then switched to Anastrozole and has been taking this medication since 09/2010.  CURRENT THERAPY:  Annual mammography and Anastrozole 1 mg PO daily, with planned therapy with Anastrozole until 09/2015 (for a total of five years).   INTERVAL HISTORY: Dr. Welton Flakes and I saw Ellen [name] today for follow-up of invasive ductal carcinoma of the left breast. The patient was last seen by Colman Cater, PA-C on 11/04/2011. Since her last office visit the patient states that she has continued hot flashes and occasional constipation. The patient denies any other symptomatology.   PAST MEDICAL HISTORY: Past Medical History  Diagnosis Date  . HTN (hypertension)   . Breast CA 10/2007    S/P Surgery and Radiation Tx completed 03/20/08  . Thyroid goiter 03/2010    FNA of Left Thyroid Nodule - 05/14/2010  . Hyperlipidemia     PAST SURGICAL HISTORY: Past Surgical History  Procedure Laterality Date  . Abdominal hysterectomy  1986    Heavy bleeding  . Breast surgery  11/08/2007    Left partial mastectomy (lumpectomy)  . Breast biopsy  12/01/2007    Left axillary sentinal node bx : positive    FAMILY HISTORY: Family History  Problem Relation Age of Onset  . Cancer Father     Died at 76 of Liver CA  . Diabetes Father     SOCIAL HISTORY: History  Substance Use Topics  . Smoking status: Former Smoker    Types: Cigarettes    Quit date: 02/25/2007  . Smokeless tobacco: Never Used  . Alcohol Use: No    ALLERGIES: Allergies  Allergen Reactions  . Ace Inhibitors   . Simvastatin Swelling    lips     MEDICATIONS:  Current Outpatient Prescriptions  Medication Sig Dispense Refill  . alendronate (FOSAMAX) 70 MG tablet Take 1 tablet (70 mg total) by mouth every 7 (seven) days. Saturday.Take with a full glass of water on an empty stomach.  4 tablet  12  . amLODipine (NORVASC) 5 MG tablet Take 1 tablet (5 mg total) by mouth daily.  30 tablet  5  . anastrozole (ARIMIDEX) 1 MG tablet Take 1 tablet (1 mg total) by mouth daily.  90 tablet  8  . calcium-vitamin D (OSCAL WITH D) 500-200 MG-UNIT per tablet Take 1 tablet by mouth 2 (two) times daily.      . cholecalciferol (VITAMIN D) 1000 UNITS tablet Take 1,000 Units by mouth daily.      . diclofenac (VOLTAREN) 75 MG EC  tablet Take one twice daily for pain and inflammation  30 tablet  0  . diclofenac sodium (VOLTAREN) 1 % GEL Apply 2 g topically 4 (four) times daily.  1 Tube  3  . HYDROcodone-acetaminophen (VICODIN) 5-500 MG per tablet Take 1 tablet by mouth every 4 (four) hours as needed for pain.  20 tablet  0  . pravastatin (PRAVACHOL) 40 MG tablet Take 1 tablet (40 mg total) by mouth daily.  30 tablet  5   No current facility-administered medications for this visit.      REVIEW OF SYSTEMS: A 10 point review of systems was completed and is negative except as noted above.    PHYSICAL EXAMINATION: BP 146/87  Pulse 70  Temp(Src) 98.1 F (36.7 C) (Oral)  Ht 5\' 2"  (1.575 m)  Wt 180 lb 3.2 oz (81.738 kg)  BMI 32.95 kg/m2   General appearance: Alert, cooperative, well nourished, no apparent distress Head: Normocephalic, without obvious abnormality, atraumatic Eyes: Arcus senilis, PERRLA, EOMI Nose: Nares, septum and mucosa are normal, no drainage or sinus tenderness Neck: No adenopathy, supple, symmetrical, trachea midline, thyroid not enlarged, no tenderness Resp: Clear to auscultation bilaterally Cardio: Regular rate and rhythm, S1, S2 normal, no murmur, click, rub or gallop Breasts: Soft bilaterally,pendulous breasts bilaterally, left breast well-healed surgical scar, radiation changes noted, no lymphadenopathy, no nipple inversion, no axilla fullness, benign breast exam GI: Soft, distended, non-tender, hypoactive bowel sounds, no organomegaly Extremities: Extremities normal, atraumatic, no cyanosis or edema Lymph nodes: Cervical, supraclavicular, and axillary nodes normal Neurologic: Grossly normal    ECOG FS:  Grade 1 - Symptomatic but completely ambulatory   LAB RESULTS: Lab Results  Component Value Date   WBC 7.4 05/06/2012   NEUTROABS 4.1 05/06/2012   HGB 13.3 05/06/2012   HCT 39.5 05/06/2012   MCV 83.7 05/06/2012   PLT 308 05/06/2012      Chemistry      Component Value Date/Time    NA 142 05/06/2012 1430   NA 138 10/02/2011 1555   K 3.6 05/06/2012 1430   K 4.0 10/02/2011 1555   CL 106 05/06/2012 1430   CL 102 10/02/2011 1555   CO2 25 05/06/2012 1430   CO2 27 10/02/2011 1555   BUN 12.3 05/06/2012 1430   BUN 16 10/02/2011 1555   CREATININE 0.8 05/06/2012 1430   CREATININE 0.71 10/02/2011 1555   CREATININE 0.8 07/25/2010   GLU 89 07/25/2010      Component Value Date/Time   CALCIUM 9.6 05/06/2012 1430   CALCIUM 9.2 10/02/2011 1555   ALKPHOS 73 05/06/2012 1430   ALKPHOS 56 10/02/2011 1555   AST 14 05/06/2012 1430   AST 16 10/02/2011 1555   ALT 17 05/06/2012 1430   ALT 14 10/02/2011 1555   BILITOT 0.37 05/06/2012 1430   BILITOT 0.2* 10/02/2011 1555       Lab Results  Component Value Date   LABCA2 14 09/25/2010  RADIOGRAPHIC STUDIES: No results found.  ASSESSMENT: 62 y.o. [place], [place] woman with:  1.The patient underwent a  lumpectomy on 11/08/2007 for a T1b N29mi invasive ductal carcinoma with DCIS, grade 2, with 1/1 micrometastasis lymph nodes,  ER 100%, PR 100%, Ki-67 10%, HER2/neu no amplification.  She had an Oncotype score of 10 with an average rate of distant recurrence at 7%.  She is status post radiation therapy from 01/24/2008 through 03/20/2008.  She started anti-estrogen therapy with Tamoxifen from 02/2008 through 09/2010.The patient was then switched to Anastrozole and has been taking this medication since 09/2010.  2. Her last mammogram was on 09/25/2011 showed no evidence of malignancy.  3. Her last bone density scan was on 03/09/2011.  T score is unable to be viewed.   PLAN: 1.  The patient will continue anti-estrogen therapy with Arimidex 1 mg PO daily with therapy planned until 09/2015 for a total of five years on Arimidex. The patient was given a prescription for Arimidex #90 with 8 refills.  2. We will schedule the  patient's annual digital diagnostic bilateral mammogram for 09/2012.  3. The patient was given a prescription for Fosamax #4 with 12  refills. The patient was also asked to continue taking calcium 1000 mg PO daily and vitamin D 1000 IU's PO daily.  4.  We plan to see the patient again in six months at which time we will check a CMP, CBC, and vitamin D level.  All questions were answered.  The patient was encouraged to contact us in the interim with any problems, questions or concerns.    Larina Bras, NP-C 05/08/2012, 9:44 PM

## 2012-05-06 NOTE — Patient Instructions (Addendum)
Please contact us at [phone] if you have any questions or concerns. 

## 2012-05-07 ENCOUNTER — Telehealth: Payer: Self-pay | Admitting: *Deleted

## 2012-05-07 NOTE — Telephone Encounter (Signed)
sw pt daughter appt d/t  was given. MAMMO appt for 09/27/2012 and lab and ov for 11/11/2012.

## 2012-05-14 ENCOUNTER — Encounter: Payer: PRIVATE HEALTH INSURANCE | Admitting: Family Medicine

## 2012-05-21 ENCOUNTER — Encounter: Payer: Self-pay | Admitting: Family Medicine

## 2012-05-21 ENCOUNTER — Ambulatory Visit (INDEPENDENT_AMBULATORY_CARE_PROVIDER_SITE_OTHER): Payer: Managed Care, Other (non HMO) | Admitting: Family Medicine

## 2012-05-21 VITALS — BP 120/80 | HR 71 | Temp 98.2°F | Resp 16 | Ht 61.75 in | Wt 180.8 lb

## 2012-05-21 DIAGNOSIS — E049 Nontoxic goiter, unspecified: Secondary | ICD-10-CM

## 2012-05-21 DIAGNOSIS — Z760 Encounter for issue of repeat prescription: Secondary | ICD-10-CM

## 2012-05-21 DIAGNOSIS — I10 Essential (primary) hypertension: Secondary | ICD-10-CM

## 2012-05-21 DIAGNOSIS — Z0000 Encounter for general adult medical examination without abnormal findings: Secondary | ICD-10-CM

## 2012-05-21 LAB — POCT URINALYSIS DIPSTICK
Protein, UA: NEGATIVE
Spec Grav, UA: 1.025
Urobilinogen, UA: 0.2

## 2012-05-21 LAB — T3, FREE: T3, Free: 3.4 pg/mL (ref 2.3–4.2)

## 2012-05-21 LAB — T4, FREE: Free T4: 1.07 ng/dL (ref 0.80–1.80)

## 2012-05-21 MED ORDER — PRAVASTATIN SODIUM 40 MG PO TABS: 40.0000 mg | ORAL_TABLET | Freq: Every day | ORAL | Status: DC

## 2012-05-21 MED ORDER — AMLODIPINE BESYLATE 5 MG PO TABS: 5.0000 mg | ORAL_TABLET | Freq: Every day | ORAL | Status: DC

## 2012-05-21 NOTE — Progress Notes (Signed)
Subjective:    Patient ID: Ellen [name], female    DOB: [birth year], 62 y.o.   MRN: [id number]  HPI  This 62 y.o. AA female is here for CPE. Pt was seen at 102 recently evaluation and treatment  of hip pain; that pain has resolved. She has HTN and is compliant w/ medications w/o adverse   effects. She continues in follow-up w/ Oncology for treatment of Breast Cancer.   Review of Systems  Constitutional: Negative.   HENT: Negative.   Eyes: Negative.   Respiratory: Negative.   Cardiovascular: Negative.   Gastrointestinal: Negative.   Endocrine: Negative.   Genitourinary: Negative.   Musculoskeletal: Negative.   Skin: Negative.   Allergic/Immunologic: Negative.   Neurological: Negative.   Hematological: Negative.   Psychiatric/Behavioral: Negative.        Objective:   Physical Exam  Nursing note and vitals reviewed. Constitutional: She is oriented to person, place, and time. Vital signs are normal. She appears well-developed and well-nourished. No distress.  HENT:  Head: Normocephalic and atraumatic.  Right Ear: Hearing, tympanic membrane, external ear and ear canal normal.  Left Ear: Hearing, tympanic membrane, external ear and ear canal normal.  Nose: Nose normal. No mucosal edema, rhinorrhea, nasal deformity or septal deviation. Right sinus exhibits no maxillary sinus tenderness and no frontal sinus tenderness. Left sinus exhibits no maxillary sinus tenderness and no frontal sinus tenderness.  Mouth/Throat: Uvula is midline, oropharynx is clear and moist and mucous membranes are normal. No oral lesions. Normal dentition. No dental caries.  Eyes: Conjunctivae, EOM and lids are normal. Pupils are equal, round, and reactive to light. No scleral icterus.  Recent professional eye care evaluation.  Neck: Normal range of motion. Neck supple. No JVD present. No tracheal deviation present. Thyromegaly present.  Cardiovascular: Normal rate, regular rhythm, normal heart sounds and intact  distal pulses.  Exam reveals no gallop and no friction rub.   No murmur heard. Pulmonary/Chest: Effort normal and breath sounds normal. No respiratory distress.  Abdominal: Soft. Normal aorta and bowel sounds are normal. She exhibits no distension, no abdominal bruit, no pulsatile midline mass and no mass. There is no hepatosplenomegaly. There is no tenderness. There is no guarding and no CVA tenderness. No hernia.  Genitourinary: Vagina normal. Rectal exam shows external hemorrhoid. Rectal exam shows no fissure, no mass, no tenderness and anal tone normal. Guaiac negative stool.  Musculoskeletal: Normal range of motion. She exhibits no edema and no tenderness.  Lymphadenopathy:    She has no cervical adenopathy.  Neurological: She is alert and oriented to person, place, and time. She has normal reflexes. No cranial nerve deficit. She exhibits normal muscle tone. Coordination normal.  Skin: Skin is warm and dry. No rash noted. No erythema.  Psychiatric: She has a normal mood and affect. Her behavior is normal. Judgment and thought content normal.     Results for orders placed in visit on 05/21/12  IFOBT (OCCULT BLOOD)      Result Value Range   IFOBT Negative    POCT URINALYSIS DIPSTICK      Result Value Range   Color, UA yellow     Clarity, UA clear     Glucose, UA neg     Bilirubin, UA neg     Ketones, UA neg     Spec Grav, UA 1.025     Blood, UA trace     pH, UA 6.5     Protein, UA neg     Urobilinogen, UA  0.2     Nitrite, UA neg     Leukocytes, UA Negative         Assessment & Plan:  Routine general medical examination at a health care facility - Plan: IFOBT POC (occult bld, rslt in office), POCT urinalysis dipstick  Enlarged thyroid gland - Plan: T3, Free, T4, Free, TSH  HTN (hypertension)  Issue of repeat prescriptions  Meds ordered this encounter  Medications  . amLODipine (NORVASC) 5 MG tablet    Sig: Take 1 tablet (5 mg total) by mouth daily.    Dispense:  30  tablet    Refill:  11  . pravastatin (PRAVACHOL) 40 MG tablet    Sig: Take 1 tablet (40 mg total) by mouth daily.    Dispense:  30 tablet    Refill:  11

## 2012-05-21 NOTE — Patient Instructions (Signed)
Keeping You Healthy  Get These Tests  Blood Pressure- Have your blood pressure checked by your healthcare provider at least once a year.  Normal blood pressure is 120/80.  Weight- Have your body mass index (BMI) calculated to screen for obesity.  BMI is a measure of body fat based on height and weight.  You can calculate your own BMI at https://www.west-esparza.com/  Cholesterol- Have your cholesterol checked every year.  Diabetes- Have your blood sugar checked every year if you have high blood pressure, high cholesterol, a family history of diabetes or if you are overweight.  Pap Smear- Have a pap smear every 1 to 3 years if you have been sexually active.  If you are older than 65 and recent pap smears have been normal you may not need additional pap smears.  In addition, if you have had a hysterectomy  For benign disease additional pap smears are not necessary.  Mammogram-Yearly mammograms are essential for early detection of breast cancer  Screening for Colon Cancer- Colonoscopy starting at age 56. Screening may begin sooner depending on your family history and other health conditions.  Follow up colonoscopy as directed by your Gastroenterologist.  Screening for Osteoporosis- Screening begins at age 22 with bone density scanning, sooner if you are at higher risk for developing Osteoporosis.  Get these medicines  Calcium with Vitamin D- Your body requires 1200-1500 mg of Calcium a day and [phone] IU of Vitamin D a day.  You can only absorb 500 mg of Calcium at a time therefore Calcium must be taken in 2 or 3 separate doses throughout the day.  Hormones- Hormone therapy has been associated with increased risk for certain cancers and heart disease.  Talk to your healthcare provider about if you need relief from menopausal symptoms.  Aspirin- Ask your healthcare provider about taking Aspirin to prevent Heart Disease and Stroke.  Get these Immuniztions  Flu shot- Every fall  Pneumonia  shot- Once after the age of 70; if you are younger ask your healthcare provider if you need a pneumonia shot.  Tetanus- Every ten years.  Zostavax- Once after the age of 48 to prevent shingles. You have a prescription to get this vaccine at [facility] or [address].  Take these steps  Don't smoke- Your healthcare provider can help you quit. For tips on how to quit, ask your healthcare provider or go to www.smokefree.gov or call 1-800 QUIT-NOW.  Be physically active- Exercise 5 days a week for a minimum of 30 minutes.  If you are not already physically active, start slow and gradually work up to 30 minutes of moderate physical activity.  Try walking, dancing, bike riding, swimming, etc.  Eat a healthy diet- Eat a variety of healthy foods such as fruits, vegetables, whole grains, low fat milk, low fat cheeses, yogurt, lean meats, chicken, fish, eggs, dried beans, tofu, etc.  For more information go to www.thenutritionsource.org  Dental visit- Brush and floss teeth twice daily; visit your dentist twice a year.  Eye exam- Visit your Optometrist or Ophthalmologist yearly.  Drink alcohol in moderation- Limit alcohol intake to one drink or less a day.  Never drink and drive.  Depression- Your emotional health is as important as your physical health.  If you're feeling down or losing interest in things you normally enjoy, please talk to your healthcare provider.  Seat Belts- can save your life; always wear one  Smoke/Carbon Monoxide detectors- These detectors need to be installed on the appropriate level of  your home.  Replace batteries at least once a year.  Violence- If anyone is threatening or hurting you, please tell your healthcare provider.  Living Will/ Health care power of attorney- Discuss with your healthcare provider and family.

## 2012-05-24 NOTE — Progress Notes (Signed)
Quick Note:  Please notify pt that results are normal.   Provide pt with copy of labs. ______ 

## 2012-05-25 ENCOUNTER — Encounter: Payer: Self-pay | Admitting: *Deleted

## 2012-09-27 ENCOUNTER — Telehealth: Payer: Self-pay | Admitting: Medical Oncology

## 2012-09-27 ENCOUNTER — Ambulatory Visit
Admission: RE | Admit: 2012-09-27 | Discharge: 2012-09-27 | Disposition: A | Discharge status: Home / Self Care | Payer: Managed Care, Other (non HMO) | Source: Ambulatory Visit | Attending: Family | Admitting: Family

## 2012-09-27 DIAGNOSIS — Z853 Personal history of malignant neoplasm of breast: Secondary | ICD-10-CM

## 2012-09-27 NOTE — Telephone Encounter (Signed)
Per NP, Larina Bras. Informed patient mammogram is normal. Patient encouraged to call office with any questions or concerns. Expressed thanks. Denies any questions at this time.

## 2012-10-26 ENCOUNTER — Telehealth: Payer: Self-pay | Admitting: Oncology

## 2012-10-26 NOTE — Telephone Encounter (Signed)
, °

## 2012-11-08 ENCOUNTER — Telehealth: Payer: Self-pay | Admitting: Oncology

## 2012-11-11 ENCOUNTER — Ambulatory Visit: Payer: Managed Care, Other (non HMO) | Admitting: Oncology

## 2012-11-11 ENCOUNTER — Other Ambulatory Visit: Payer: Managed Care, Other (non HMO) | Admitting: Lab

## 2012-11-15 ENCOUNTER — Other Ambulatory Visit (HOSPITAL_BASED_OUTPATIENT_CLINIC_OR_DEPARTMENT_OTHER): Payer: Managed Care, Other (non HMO) | Admitting: Lab

## 2012-11-15 ENCOUNTER — Telehealth: Payer: Self-pay | Admitting: *Deleted

## 2012-11-15 ENCOUNTER — Ambulatory Visit (HOSPITAL_BASED_OUTPATIENT_CLINIC_OR_DEPARTMENT_OTHER): Payer: Managed Care, Other (non HMO) | Admitting: Oncology

## 2012-11-15 VITALS — BP 138/82 | HR 67 | Temp 98.4°F | Resp 20 | Ht 61.75 in | Wt 175.6 lb

## 2012-11-15 DIAGNOSIS — Z853 Personal history of malignant neoplasm of breast: Secondary | ICD-10-CM

## 2012-11-15 DIAGNOSIS — M810 Age-related osteoporosis without current pathological fracture: Secondary | ICD-10-CM

## 2012-11-15 DIAGNOSIS — C50919 Malignant neoplasm of unspecified site of unspecified female breast: Secondary | ICD-10-CM

## 2012-11-15 DIAGNOSIS — C50912 Malignant neoplasm of unspecified site of left female breast: Secondary | ICD-10-CM

## 2012-11-15 LAB — CBC WITH DIFFERENTIAL/PLATELET
Basophils Absolute: 0 10*3/uL (ref 0.0–0.1)
EOS%: 1.4 % (ref 0.0–7.0)
HCT: 39.8 % (ref 34.8–46.6)
HGB: 13.1 g/dL (ref 11.6–15.9)
MCH: 27.4 pg (ref 25.1–34.0)
MCV: 83.4 fL (ref 79.5–101.0)
MONO%: 8.7 % (ref 0.0–14.0)
NEUT%: 48.3 % (ref 38.4–76.8)
lymph#: 2.9 10*3/uL (ref 0.9–3.3)

## 2012-11-15 LAB — COMPREHENSIVE METABOLIC PANEL (CC13)
Albumin: 3.9 g/dL (ref 3.5–5.0)
Alkaline Phosphatase: 74 U/L (ref 40–150)
BUN: 10.2 mg/dL (ref 7.0–26.0)
Glucose: 90 mg/dl (ref 70–140)
Potassium: 3.6 mEq/L (ref 3.5–5.1)

## 2012-11-15 NOTE — Progress Notes (Signed)
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   PRIOR THERAPY: 1.The patient underwent a digital screening mammogram on 09/20/2007 in which additional evaluation of the left breast was indicated. No specific mammographic evidence of malignancy of the right breast.  2.The patient had a left breast ultrasound on 10/14/2007 which showed suspicious findings and recommended stereotactic biopsy of microcalcifications in the left breast at the 3 o'clock position.  The ultrasound also recommended a six-month follow-up for probable fibroadenoma in the left breast at the 9 o'clock position.  3. On 10/15/2007 the patient had a MRI of bilateral breasts which showed a 4.4 x 2.7 x 1.5 cm biopsy proven ductal carcinoma in situ at the 3 o'clock position of the left breast.  The patient also had multiple bilateral fibroadenomas that do not require follow-up. The MRI also showed to liver cysts.  4.The patient underwent a  lumpectomy on 11/08/2007 for a T1b N75mi invasive ductal carcinoma with DCIS, grade 2, with 1/1 micrometastasis lymph node,  ER 100%, PR 100%, Ki-67 10%, HER2/neu no amplification.  5. Oncotype score of 10 with an average rate of distant recurrence at 7%.  6.  Status post radiation therapy from 01/24/2008 through 03/20/2008.  7.  The patient started anti-estrogen therapy with Tamoxifen from 02/2008 through 09/2010.The patient was then switched to Anastrozole and has been taking this medication since 09/2010.  CURRENT THERAPY: Anastrozole 1 mg PO daily, with planned therapy with Anastrozole until 09/2015 (for a total of five years).   INTERVAL HISTORY:  Miss Ellen [name] today for follow-up of invasive ductal  carcinoma of the left breast. Clinically patient is doing well without any significant problems. She continues to tolerate the anastrozole. She has some aches and pains. She has no nausea vomiting no vaginal bleeding. No peripheral paresthesias. She does experience some hot flashes. Remainder of the 10 point review of systems is negative.   PAST MEDICAL HISTORY: Past Medical History  Diagnosis Date  . HTN (hypertension)   . Breast CA 10/2007    S/P Surgery and Radiation Tx completed 03/20/08  . Thyroid goiter 03/2010    FNA of Left Thyroid Nodule - 05/14/2010  . Hyperlipidemia     PAST SURGICAL HISTORY: Past Surgical History  Procedure Laterality Date  . Abdominal hysterectomy  1986    Heavy bleeding  . Breast surgery  11/08/2007    Left partial mastectomy (lumpectomy)  . Breast biopsy  12/01/2007    Left axillary sentinal node bx : positive    FAMILY HISTORY: Family History  Problem Relation Age of Onset  . Cancer Father     Died at 45 of Liver CA  . Diabetes Father     SOCIAL HISTORY: History  Substance Use Topics  . Smoking status: Former Smoker    Types: Cigarettes    Quit date: 02/25/2007  . Smokeless tobacco: Never Used  . Alcohol Use: No    ALLERGIES: Allergies  Allergen Reactions  . Ace Inhibitors   . Simvastatin Swelling    lips     MEDICATIONS:  Current Outpatient Prescriptions  Medication Sig Dispense Refill  . alendronate (FOSAMAX) 70 MG tablet Take 1 tablet (70 mg total) by mouth every 7 (seven) days. Saturday.Take with a full glass of  water on an empty stomach.  4 tablet  12  . amLODipine (NORVASC) 5 MG tablet Take 1 tablet (5 mg total) by mouth daily.  30 tablet  11  . anastrozole (ARIMIDEX) 1 MG tablet Take 1 tablet (1 mg total) by mouth daily.  90 tablet  8  . calcium-vitamin D (OSCAL WITH D) 500-200 MG-UNIT per tablet Take 1 tablet by mouth 2 (two) times daily.      . cholecalciferol (VITAMIN D) 1000 UNITS tablet Take 1,000 Units by mouth  daily.      . diclofenac (VOLTAREN) 75 MG EC tablet Take one twice daily for pain and inflammation  30 tablet  0  . diclofenac sodium (VOLTAREN) 1 % GEL Apply 2 g topically 4 (four) times daily.  1 Tube  3  . pravastatin (PRAVACHOL) 40 MG tablet Take 1 tablet (40 mg total) by mouth daily.  30 tablet  11   No current facility-administered medications for this visit.      REVIEW OF SYSTEMS: A 10 point review of systems was completed and is negative except as noted above.    PHYSICAL EXAMINATION: BP 138/82  Pulse 67  Temp(Src) 98.4 F (36.9 C) (Oral)  Resp 20  Ht 5' 1.75" (1.568 m)  Wt 175 lb 9.6 oz (79.652 kg)  BMI 32.4 kg/m2   General appearance: Alert, cooperative, well nourished, no apparent distress Head: Normocephalic, without obvious abnormality, atraumatic Eyes: Arcus senilis, PERRLA, EOMI Nose: Nares, septum and mucosa are normal, no drainage or sinus tenderness Neck: No adenopathy, supple, symmetrical, trachea midline, thyroid not enlarged, no tenderness Resp: Clear to auscultation bilaterally Cardio: Regular rate and rhythm, S1, S2 normal, no murmur, click, rub or gallop Breasts: Soft bilaterally,pendulous breasts bilaterally, left breast well-healed surgical scar, radiation changes noted, no lymphadenopathy, no nipple inversion, no axilla fullness, benign breast exam GI: Soft, distended, non-tender, hypoactive bowel sounds, no organomegaly Extremities: Extremities normal, atraumatic, no cyanosis or edema Lymph nodes: Cervical, supraclavicular, and axillary nodes normal Neurologic: Grossly normal    ECOG FS:  Grade 1 - Symptomatic but completely ambulatory   LAB RESULTS: Lab Results  Component Value Date   WBC 6.9 11/15/2012   NEUTROABS 3.4 11/15/2012   HGB 13.1 11/15/2012   HCT 39.8 11/15/2012   MCV 83.4 11/15/2012   PLT 344 11/15/2012      Chemistry      Component Value Date/Time   NA 143 11/15/2012 1444   NA 138 10/02/2011 1555   K 3.6 11/15/2012 1444   K 4.0  10/02/2011 1555   CL 106 05/06/2012 1430   CL 102 10/02/2011 1555   CO2 26 11/15/2012 1444   CO2 27 10/02/2011 1555   BUN 10.2 11/15/2012 1444   BUN 16 10/02/2011 1555   CREATININE 0.8 11/15/2012 1444   CREATININE 0.71 10/02/2011 1555   CREATININE 0.8 07/25/2010   GLU 89 07/25/2010      Component Value Date/Time   CALCIUM 9.7 11/15/2012 1444   CALCIUM 9.2 10/02/2011 1555   ALKPHOS 74 11/15/2012 1444   ALKPHOS 56 10/02/2011 1555   AST 14 11/15/2012 1444   AST 16 10/02/2011 1555   ALT 13 11/15/2012 1444   ALT 14 10/02/2011 1555   BILITOT 0.30 11/15/2012 1444   BILITOT 0.2* 10/02/2011 1555       Lab Results  Component Value Date   LABCA2 14 09/25/2010    RADIOGRAPHIC STUDIES: No results found.  ASSESSMENT: 62 y.o. [place], [place] woman with:  1.The patient  underwent a  lumpectomy on 11/08/2007 for a T1b N87mi invasive ductal carcinoma with DCIS, grade 2, with 1/1 micrometastasis lymph nodes,  ER 100%, PR 100%, Ki-67 10%, HER2/neu no amplification.  She had an Oncotype score of 10 with an average rate of distant recurrence at 7%.  She is status post radiation therapy from 01/24/2008 through 03/20/2008.  She started anti-estrogen therapy with Tamoxifen from 02/2008 through 09/2010.The patient was then switched to Anastrozole and has been taking this medication since 09/2010. Patient has no evidence of recurrent disease  #2 osteopenia/osteoporosis: Patient is on Fosamax q. weekly. Risks benefits and side effects of this were discussed with her in detail. We will plan on doing another bone density in about 2 years time.  PLAN: 1.  continue Arimidex 1 mg daily  #2 patient will be seen back in 6 months time for followup  All questions were answered.  The patient was encouraged to contact us in the interim with any problems, questions or concerns.   Drue Second, MD Medical/Oncology [facility] [phone] (beeper) [phone] (Office)

## 2012-11-15 NOTE — Telephone Encounter (Signed)
appts made and printed...td 

## 2012-11-15 NOTE — Patient Instructions (Addendum)
Doing well no evidence of recurrent disease.  Please continue taking your anastrozole.  I will see you back in 6 months time for followup.

## 2012-11-26 ENCOUNTER — Ambulatory Visit (INDEPENDENT_AMBULATORY_CARE_PROVIDER_SITE_OTHER): Payer: Managed Care, Other (non HMO) | Admitting: Family Medicine

## 2012-11-26 ENCOUNTER — Encounter: Payer: Self-pay | Admitting: Family Medicine

## 2012-11-26 VITALS — BP 120/72 | HR 74 | Temp 99.0°F | Resp 16 | Ht 61.5 in | Wt 179.8 lb

## 2012-11-26 DIAGNOSIS — E785 Hyperlipidemia, unspecified: Secondary | ICD-10-CM

## 2012-11-26 DIAGNOSIS — I10 Essential (primary) hypertension: Secondary | ICD-10-CM

## 2012-11-26 LAB — LIPID PANEL
Cholesterol: 212 mg/dL — ABNORMAL HIGH (ref 0–200)
HDL: 44 mg/dL (ref >39)
LDL Cholesterol: 141 mg/dL — ABNORMAL HIGH (ref 0–99)
Triglycerides: 137 mg/dL (ref <150)

## 2012-11-26 NOTE — Patient Instructions (Signed)
You have refills until March  2015 for the medications that I prescribe. Continue walking and eating as healthy as you can; the weight will slowly come off- be patient but be diligent!!!   Exercise to Lose Weight Exercise and a healthy diet may help you lose weight. Your doctor may suggest specific exercises. EXERCISE IDEAS AND TIPS  Choose low-cost things you enjoy doing, such as walking, bicycling, or exercising to workout videos.  Take stairs instead of the elevator.  Walk during your lunch break.  Park your car further away from work or school.  Go to a gym or an exercise class.  Start with 5 to 10 minutes of exercise each day. Build up to 30 minutes of exercise 4 to 6 days a week.  Wear shoes with good support and comfortable clothes.  Stretch before and after working out.  Work out until you breathe harder and your heart beats faster.  Drink extra water when you exercise.  Do not do so much that you hurt yourself, feel dizzy, or get very short of breath. Exercises that burn about 150 calories:  Running 1  miles in 15 minutes.  Playing volleyball for 45 to 60 minutes.  Washing and waxing a car for 45 to 60 minutes.  Playing touch football for 45 minutes.  Walking 1  miles in 35 minutes.  Pushing a stroller 1  miles in 30 minutes.  Playing basketball for 30 minutes.  Raking leaves for 30 minutes.  Bicycling 5 miles in 30 minutes.  Walking 2 miles in 30 minutes.  Dancing for 30 minutes.  Shoveling snow for 15 minutes.  Swimming laps for 20 minutes.  Walking up stairs for 15 minutes.  Bicycling 4 miles in 15 minutes.  Gardening for 30 to 45 minutes.  Jumping rope for 15 minutes.  Washing windows or floors for 45 to 60 minutes. Document Released: 03/15/2010 Document Revised: 05/05/2011 Document Reviewed: 03/15/2010 [facility] Patient Information 2014 [place], [place].

## 2012-11-27 NOTE — Progress Notes (Signed)
S: This 62 y.o. AA female is here for HTN follow-up and discussion of lipid disorder. She is compliant with medications as evidenced by well controlled BP. She reports no medication side effects and has modified lifestyle to include regular walking w/ daughter. She has eaten today but agrees to have lipids checked (lipids values available for review in EPIC dated 07/25/2010).  Patient Active Problem List   Diagnosis Date Noted  . Hyperlipidemia 12/11/2003    Priority: Medium    Class: Chronic  . History of tobacco use 11/05/2011  . History of breast cancer 11/04/2011  . Obesity (BMI 30-39.9) 04/03/2011  . HTN (hypertension)   . COLONIC POLYPS, ADENOMATOUS, HX OF 05/02/2008   PMHx, Soc Hx and Medications reviewed and reconciled.  ROS: Negative for diaphoresis, abnormal weight change, fatigue, CP or tightness, palpitations, SOB or DOE, edema, myalgias or back pain, HA, dizziness, weakness, numbness or syncope.  O: Filed Vitals:   11/26/12 1602  BP: 120/72  Pulse: 74  Temp: 99 F (37.2 C)  Resp: 16   GEN: In NAD; WN,WD. HENT: [place]/AT; EOMI w/ clear conj and slightly muddy sclerae. EACs/nose/oroph unremarkable. COR: RRR. No edema. LUNGS: Normal resp rate and effort. SKIN: W&D; intact w/o abnormalities. NEURO: A&O x 3; Cns intact. Nonfocal.  A/P: HTN (hypertension)- Stable and well controlled. No medication changes.  Other and unspecified hyperlipidemia - Continue current dose of statin pending labs.   Plan: Lipid panel

## 2012-11-30 NOTE — Progress Notes (Signed)
Quick Note:  Please advise pt regarding following labs... Total and LDL ("bad") cholesterol are above normal. Continue taking the medication to lower your cholesterol and try to eat as healthy as you can. These labs can be rechecked at your next visit.  I do not want to increase your Pravastatin dose at this time; I want you to try to exercise most days of the week so that you can lose some weight.  Copy to pt. ______

## 2013-02-15 ENCOUNTER — Encounter: Payer: Managed Care, Other (non HMO) | Admitting: Family Medicine

## 2013-03-17 ENCOUNTER — Encounter: Payer: Managed Care, Other (non HMO) | Admitting: Family Medicine

## 2013-03-31 ENCOUNTER — Encounter: Payer: Self-pay | Admitting: Gastroenterology

## 2013-04-28 ENCOUNTER — Ambulatory Visit (INDEPENDENT_AMBULATORY_CARE_PROVIDER_SITE_OTHER): Payer: Managed Care, Other (non HMO) | Admitting: Family Medicine

## 2013-04-28 ENCOUNTER — Encounter: Payer: Self-pay | Admitting: Family Medicine

## 2013-04-28 ENCOUNTER — Telehealth: Payer: Self-pay | Admitting: Family Medicine

## 2013-04-28 VITALS — BP 142/82 | HR 74 | Temp 98.3°F | Resp 16 | Ht 62.0 in | Wt 183.0 lb

## 2013-04-28 DIAGNOSIS — E049 Nontoxic goiter, unspecified: Secondary | ICD-10-CM | POA: Insufficient documentation

## 2013-04-28 DIAGNOSIS — E785 Hyperlipidemia, unspecified: Secondary | ICD-10-CM

## 2013-04-28 DIAGNOSIS — N898 Other specified noninflammatory disorders of vagina: Secondary | ICD-10-CM

## 2013-04-28 DIAGNOSIS — Z1159 Encounter for screening for other viral diseases: Secondary | ICD-10-CM

## 2013-04-28 DIAGNOSIS — I10 Essential (primary) hypertension: Secondary | ICD-10-CM

## 2013-04-28 LAB — HEPATITIS C ANTIBODY: HCV AB: NEGATIVE

## 2013-04-28 LAB — POCT URINALYSIS DIPSTICK
Bilirubin, UA: NEGATIVE
Blood, UA: NEGATIVE
GLUCOSE UA: NEGATIVE
Ketones, UA: NEGATIVE
LEUKOCYTES UA: NEGATIVE
Nitrite, UA: NEGATIVE
PROTEIN UA: NEGATIVE
Spec Grav, UA: 1.02
UROBILINOGEN UA: 0.2
pH, UA: 7

## 2013-04-28 LAB — LIPID PANEL
CHOLESTEROL: 161 mg/dL (ref 0–200)
HDL: 42 mg/dL (ref >39)
LDL Cholesterol: 97 mg/dL (ref 0–99)
Total CHOL/HDL Ratio: 3.8 Ratio
Triglycerides: 109 mg/dL (ref <150)
VLDL: 22 mg/dL (ref 0–40)

## 2013-04-28 LAB — BASIC METABOLIC PANEL
BUN: 14 mg/dL (ref 6–23)
CHLORIDE: 104 meq/L (ref 96–112)
CO2: 28 meq/L (ref 19–32)
Calcium: 9.4 mg/dL (ref 8.4–10.5)
Creat: 0.76 mg/dL (ref 0.50–1.10)
GLUCOSE: 104 mg/dL — AB (ref 70–99)
POTASSIUM: 4.5 meq/L (ref 3.5–5.3)
SODIUM: 144 meq/L (ref 135–145)

## 2013-04-28 LAB — ALT: ALT: 15 U/L (ref 0–35)

## 2013-04-28 MED ORDER — PRAVASTATIN SODIUM 40 MG PO TABS: 40.0000 mg | ORAL_TABLET | Freq: Every day | ORAL | Status: DC

## 2013-04-28 MED ORDER — AMLODIPINE BESYLATE 5 MG PO TABS: 5.0000 mg | ORAL_TABLET | Freq: Every day | ORAL | Status: DC

## 2013-04-28 MED ORDER — BLOOD PRESSURE MONITOR/WRIST DEVI: 1.0000 | Freq: Every day | Status: DC

## 2013-04-28 NOTE — Telephone Encounter (Signed)
During visit today, I discussed BP cuff for home use and pt stated that she did want one. I prescribed a device after her visit and routed it to her pharmacy.

## 2013-04-29 NOTE — Progress Notes (Signed)
Subjective:    Patient ID: Ellen [name], female    DOB: [birth year], 63 y.o.   MRN: [id number]  HPI This 63 y.o. AA female is here for follow-up of well controlled HTN. She is compliant w/ medication but unable to monitor BP at home (BP device not working). She maintains  heart-healthy nutrition but has gained weight due to lack of exercise.  Hyperlipidemia- Taking statin daily w/o adverse effects. Last ime lipid panel was checked, I opted not to increase pt's Pravastatin dose but asked that she increase physical activity for weight loss. Her CAD risk is about average.  Goiter- thyroid gland is prominent but pt has not complaints of neck discomfort or difficulty swallowing. She denies diaphoresis, CP or tightness, palpitations or tremors, no GI problems or skin/ hair changes.   Patient Active Problem List   Diagnosis Date Noted  . Hyperlipidemia 12/11/2003    Priority: Medium    Class: Chronic  . Goiter 04/28/2013  . History of tobacco use 11/05/2011  . History of breast cancer 11/04/2011  . Obesity (BMI 30-39.9) 04/03/2011  . HTN (hypertension)   . COLONIC POLYPS, ADENOMATOUS, HX OF 05/02/2008   Prior to Admission medications   Medication Sig Start Date End Date Taking? Authorizing Provider  alendronate (FOSAMAX) 70 MG tablet Take 1 tablet (70 mg total) by mouth every 7 (seven) days. Saturday.Take with a full glass of water on an empty stomach. 05/06/12  Yes Vivien Rota, NP  amLODipine (NORVASC) 5 MG tablet Take 1 tablet (5 mg total) by mouth daily.   Yes Barton Fanny, MD  calcium-vitamin D (OSCAL WITH D) 500-200 MG-UNIT per tablet Take 1 tablet by mouth 2 (two) times daily.   Yes Historical Provider, MD  cholecalciferol (VITAMIN D) 1000 UNITS tablet Take 1,000 Units by mouth daily.   Yes Historical Provider, MD  pravastatin (PRAVACHOL) 40 MG tablet Take 1 tablet (40 mg total) by mouth daily.   Yes Barton Fanny, MD  Blood Pressure Monitoring (BLOOD PRESSURE  MONITOR/WRIST) DEVI 1 Device by Does not apply route daily.    Barton Fanny, MD   PMHx, Surg Hx, Soc and Fam HX reviewed.   Review of Systems  Constitutional: Negative.   HENT: Negative.   Eyes: Negative.   Respiratory: Negative.   Cardiovascular: Negative.   Gastrointestinal: Negative.   Endocrine: Negative.   Musculoskeletal: Negative.   Neurological: Negative.   Psychiatric/Behavioral: Negative.       Objective:   Physical Exam  Nursing note and vitals reviewed. Constitutional: She is oriented to person, place, and time. Vital signs are normal. She appears well-developed and well-nourished. No distress.  HENT:  Head: Normocephalic and atraumatic.  Right Ear: Hearing, tympanic membrane, external ear and ear canal normal.  Left Ear: Hearing, tympanic membrane, external ear and ear canal normal.  Nose: Nose normal. No nasal deformity or septal deviation.  Mouth/Throat: Uvula is midline, oropharynx is clear and moist and mucous membranes are normal. No oral lesions. Normal dentition.  Eyes: Conjunctivae, EOM and lids are normal. Pupils are equal, round, and reactive to light. No scleral icterus.  Neck: Normal range of motion and full passive range of motion without pain. Neck supple. No JVD present. No spinous process tenderness and no muscular tenderness present. Carotid bruit is not present. Thyromegaly present. No mass present.  Cardiovascular: Normal rate, regular rhythm, S1 normal, S2 normal, normal heart sounds and normal pulses.   No extrasystoles are present. PMI is not displaced.  Exam reveals no gallop and no friction rub.   No murmur heard. Pulmonary/Chest: Effort normal and breath sounds normal. No respiratory distress.  Abdominal: Soft. Normal appearance and bowel sounds are normal. She exhibits no distension, no abdominal bruit, no pulsatile midline mass and no mass. There is no hepatosplenomegaly. There is no tenderness. There is no guarding and no CVA  tenderness.  Musculoskeletal: Normal range of motion. She exhibits no edema and no tenderness.  Neurological: She is alert and oriented to person, place, and time. She has normal strength and normal reflexes. She displays no atrophy and no tremor. No cranial nerve deficit or sensory deficit. She exhibits normal muscle tone. Coordination and gait normal.  Skin: Skin is warm, dry and intact. No bruising, no lesion and no rash noted. She is not diaphoretic. No cyanosis or erythema. Nails show no clubbing.  Psychiatric: She has a normal mood and affect. Her speech is normal and behavior is normal. Judgment and thought content normal. Cognition and memory are normal.       Assessment & Plan:  HTN (hypertension) - Stable on current medications. RX: Blood pressure cuff. Plan: POCT urinalysis dipstick, Basic metabolic panel, ALT  Other and unspecified hyperlipidemia - Continue statin; no adverse effects reported. Plan: Lipid panel  Goiter- Stable; FNA performed in 2012.  Need for hepatitis C screening test - Plan: Hepatitis C antibody   Meds ordered this encounter  Medications  . amLODipine (NORVASC) 5 MG tablet    Sig: Take 1 tablet (5 mg total) by mouth daily.    Dispense:  30 tablet    Refill:  11  . pravastatin (PRAVACHOL) 40 MG tablet    Sig: Take 1 tablet (40 mg total) by mouth daily.    Dispense:  30 tablet    Refill:  11  . Blood Pressure Monitoring (BLOOD PRESSURE MONITOR/WRIST) DEVI    Sig: 1 Device by Does not apply route daily.    Dispense:  1 Device    Refill:  0

## 2013-04-30 NOTE — Progress Notes (Signed)
Quick Note:  Please notify pt that results are normal.   Provide pt with copy of labs. ______ 

## 2013-05-01 ENCOUNTER — Encounter: Payer: Self-pay | Admitting: Family Medicine

## 2013-05-16 ENCOUNTER — Ambulatory Visit: Payer: Managed Care, Other (non HMO) | Admitting: Oncology

## 2013-05-16 ENCOUNTER — Telehealth: Payer: Self-pay | Admitting: Oncology

## 2013-05-16 ENCOUNTER — Other Ambulatory Visit: Payer: Managed Care, Other (non HMO)

## 2013-05-16 NOTE — Telephone Encounter (Signed)
, °

## 2013-05-23 NOTE — Progress Notes (Deleted)
Hematology and Oncology Follow Up Visit  Ellen [name] [id number] 22-Oct-1950 63 y.o. 05/23/2013 6:10 PM     Principle Diagnosis:Frank B Cyndi Bender 63 y.o. female***   Prior Therapy:  1.The patient underwent a digital screening mammogram on 09/20/2007 in which additional evaluation of the left breast was indicated. No specific mammographic evidence of malignancy of the right breast.   2.The patient had a left breast ultrasound on 10/14/2007 which showed suspicious findings and recommended stereotactic biopsy of microcalcifications in the left breast at the 3 o'clock position. The ultrasound also recommended a six-month follow-up for probable fibroadenoma in the left breast at the 9 o'clock position.   3. On 10/15/2007 the patient had a MRI of bilateral breasts which showed a 4.4 x 2.7 x 1.5 cm biopsy proven ductal carcinoma in situ at the 3 o'clock position of the left breast. The patient also had multiple bilateral fibroadenomas that do not require follow-up. The MRI also showed to liver cysts.   4.The patient underwent a lumpectomy on 11/08/2007 for a T1b N89mi invasive ductal carcinoma with DCIS, grade 2, with 1/1 micrometastasis lymph node, ER 100%, PR 100%, Ki-67 10%, HER2/neu no amplification.   5. Oncotype score of 10 with an average rate of distant recurrence at 7%.   6. Status post radiation therapy from 01/24/2008 through 03/20/2008.   7. The patient started anti-estrogen therapy with Tamoxifen from 02/2008 through 09/2010.The patient was then switched to Anastrozole and has been taking this medication since 09/2010.  Current therapy:  Arimidex $RemoveB'1mg'tXnfSiKg$  daily to end in 09/2015  Interim History: Ellen [name] 63 y.o. female ***  Medications:  Current Outpatient Prescriptions  Medication Sig Dispense Refill  . alendronate (FOSAMAX) 70 MG tablet Take 1 tablet (70 mg total) by mouth every 7 (seven) days. Saturday.Take with a full glass of water on an empty stomach.  4 tablet  12  .  amLODipine (NORVASC) 5 MG tablet Take 1 tablet (5 mg total) by mouth daily.  30 tablet  11  . Blood Pressure Monitoring (BLOOD PRESSURE MONITOR/WRIST) DEVI 1 Device by Does not apply route daily.  1 Device  0  . calcium-vitamin D (OSCAL WITH D) 500-200 MG-UNIT per tablet Take 1 tablet by mouth 2 (two) times daily.      . cholecalciferol (VITAMIN D) 1000 UNITS tablet Take 1,000 Units by mouth daily.      . pravastatin (PRAVACHOL) 40 MG tablet Take 1 tablet (40 mg total) by mouth daily.  30 tablet  11   No current facility-administered medications for this visit.     Allergies:  Allergies  Allergen Reactions  . Ace Inhibitors   . Simvastatin Swelling    lips    Medical History: Past Medical History  Diagnosis Date  . HTN (hypertension)   . Breast CA 10/2007    S/P Surgery and Radiation Tx completed 03/20/08  . Thyroid goiter 03/2010    FNA of Left Thyroid Nodule - 05/14/2010  . Hyperlipidemia     Surgical History:  Past Surgical History  Procedure Laterality Date  . Abdominal hysterectomy  1986    Heavy bleeding  . Breast surgery  11/08/2007    Left partial mastectomy (lumpectomy)  . Breast biopsy  12/01/2007    Left axillary sentinal node bx : positive     Review of Systems: A 10 point review of systems was conducted and is otherwise negative except for what is noted above.     Physical Exam: There were no vitals  taken for this visit. GENERAL: Patient is a well appearing female in no acute distress HEENT:  Sclerae anicteric.  Oropharynx clear and moist. No ulcerations or evidence of oropharyngeal candidiasis. Neck is supple.  NODES:  No cervical, supraclavicular, or axillary lymphadenopathy palpated.  BREAST EXAM:  Deferred. LUNGS:  Clear to auscultation bilaterally.  No wheezes or rhonchi. HEART:  Regular rate and rhythm. No murmur appreciated. ABDOMEN:  Soft, nontender.  Positive, normoactive bowel sounds. No organomegaly palpated. MSK:  No focal spinal  tenderness to palpation. Full range of motion bilaterally in the upper extremities. EXTREMITIES:  No peripheral edema.   SKIN:  Clear with no obvious rashes or skin changes. No nail dyscrasia. NEURO:  Nonfocal. Well oriented.  Appropriate affect. ECOG PERFORMANCE STATUS: {CHL ONC ECOG [id number]   Lab Results: Lab Results  Component Value Date   WBC 6.9 11/15/2012   HGB 13.1 11/15/2012   HCT 39.8 11/15/2012   MCV 83.4 11/15/2012   PLT 344 11/15/2012     Chemistry      Component Value Date/Time   NA 144 04/28/2013 1523   NA 143 11/15/2012 1444   K 4.5 04/28/2013 1523   K 3.6 11/15/2012 1444   CL 104 04/28/2013 1523   CL 106 05/06/2012 1430   CO2 28 04/28/2013 1523   CO2 26 11/15/2012 1444   BUN 14 04/28/2013 1523   BUN 10.2 11/15/2012 1444   CREATININE 0.76 04/28/2013 1523   CREATININE 0.8 11/15/2012 1444   CREATININE 0.71 10/02/2011 1555   CREATININE 0.8 07/25/2010   GLU 89 07/25/2010      Component Value Date/Time   CALCIUM 9.4 04/28/2013 1523   CALCIUM 9.7 11/15/2012 1444   ALKPHOS 74 11/15/2012 1444   ALKPHOS 56 10/02/2011 1555   AST 14 11/15/2012 1444   AST 16 10/02/2011 1555   ALT 15 04/28/2013 1523   ALT 13 11/15/2012 1444   BILITOT 0.30 11/15/2012 1444   BILITOT 0.2* 10/02/2011 1555       Radiological Studies: ***  Assessment and Plan: Ellen [name] 63 y.o. female ***  I spent 25 minutes counseling the patient face to face.  The total time spent in the appointment was 30 minutes.  Minette Headland, [place] [phone] 05/23/2013 6:10 PM

## 2013-05-24 ENCOUNTER — Telehealth: Payer: Self-pay | Admitting: Adult Health

## 2013-05-24 ENCOUNTER — Other Ambulatory Visit: Payer: Managed Care, Other (non HMO)

## 2013-05-24 ENCOUNTER — Encounter: Payer: Managed Care, Other (non HMO) | Admitting: Adult Health

## 2013-05-24 NOTE — Telephone Encounter (Signed)
, °

## 2013-05-29 NOTE — Progress Notes (Signed)
This encounter was created in error - please disregard.

## 2013-06-03 ENCOUNTER — Other Ambulatory Visit: Payer: Self-pay | Admitting: *Deleted

## 2013-06-03 ENCOUNTER — Encounter: Payer: Self-pay | Admitting: Gastroenterology

## 2013-06-03 ENCOUNTER — Other Ambulatory Visit (HOSPITAL_BASED_OUTPATIENT_CLINIC_OR_DEPARTMENT_OTHER): Payer: Managed Care, Other (non HMO)

## 2013-06-03 ENCOUNTER — Telehealth: Payer: Self-pay | Admitting: Adult Health

## 2013-06-03 ENCOUNTER — Ambulatory Visit (HOSPITAL_BASED_OUTPATIENT_CLINIC_OR_DEPARTMENT_OTHER): Payer: Managed Care, Other (non HMO) | Admitting: Adult Health

## 2013-06-03 ENCOUNTER — Encounter: Payer: Self-pay | Admitting: Adult Health

## 2013-06-03 VITALS — BP 150/89 | HR 71 | Temp 98.8°F | Resp 20 | Ht 62.0 in | Wt 184.1 lb

## 2013-06-03 DIAGNOSIS — Z853 Personal history of malignant neoplasm of breast: Secondary | ICD-10-CM

## 2013-06-03 DIAGNOSIS — C50912 Malignant neoplasm of unspecified site of left female breast: Secondary | ICD-10-CM

## 2013-06-03 DIAGNOSIS — E2839 Other primary ovarian failure: Secondary | ICD-10-CM

## 2013-06-03 DIAGNOSIS — C50919 Malignant neoplasm of unspecified site of unspecified female breast: Secondary | ICD-10-CM

## 2013-06-03 DIAGNOSIS — Z170 Estrogen receptor positive status [ER+]: Secondary | ICD-10-CM

## 2013-06-03 LAB — COMPREHENSIVE METABOLIC PANEL (CC13)
ALT: 12 U/L (ref 0–55)
ANION GAP: 9 meq/L (ref 3–11)
AST: 15 U/L (ref 5–34)
Albumin: 3.9 g/dL (ref 3.5–5.0)
Alkaline Phosphatase: 86 U/L (ref 40–150)
BUN: 13.4 mg/dL (ref 7.0–26.0)
CALCIUM: 9.6 mg/dL (ref 8.4–10.4)
CHLORIDE: 105 meq/L (ref 98–109)
CO2: 26 mEq/L (ref 22–29)
CREATININE: 0.8 mg/dL (ref 0.6–1.1)
Glucose: 106 mg/dl (ref 70–140)
Potassium: 3.7 mEq/L (ref 3.5–5.1)
Sodium: 140 mEq/L (ref 136–145)
Total Bilirubin: 0.26 mg/dL (ref 0.20–1.20)
Total Protein: 7.8 g/dL (ref 6.4–8.3)

## 2013-06-03 LAB — CBC WITH DIFFERENTIAL/PLATELET
BASO%: 0.5 % (ref 0.0–2.0)
BASOS ABS: 0 10*3/uL (ref 0.0–0.1)
EOS%: 1.8 % (ref 0.0–7.0)
Eosinophils Absolute: 0.1 10*3/uL (ref 0.0–0.5)
HEMATOCRIT: 38.2 % (ref 34.8–46.6)
HEMOGLOBIN: 12.6 g/dL (ref 11.6–15.9)
LYMPH#: 2.3 10*3/uL (ref 0.9–3.3)
LYMPH%: 32.4 % (ref 14.0–49.7)
MCH: 27.8 pg (ref 25.1–34.0)
MCHC: 33.1 g/dL (ref 31.5–36.0)
MCV: 84 fL (ref 79.5–101.0)
MONO#: 0.7 10*3/uL (ref 0.1–0.9)
MONO%: 9.7 % (ref 0.0–14.0)
NEUT#: 4 10*3/uL (ref 1.5–6.5)
NEUT%: 55.6 % (ref 38.4–76.8)
PLATELETS: 336 10*3/uL (ref 145–400)
RBC: 4.54 10*6/uL (ref 3.70–5.45)
RDW: 14.7 % — ABNORMAL HIGH (ref 11.2–14.5)
WBC: 7.2 10*3/uL (ref 3.9–10.3)

## 2013-06-03 NOTE — Telephone Encounter (Signed)
, °

## 2013-06-03 NOTE — Progress Notes (Signed)
Hematology and Oncology Follow Up Visit  Ellen [name] [id number] [birth year] 63 y.o. 06/05/2013 7:08 AM     Principle Diagnosis:Ellen [name] 63 y.o. 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   Prior Therapy:  1.The patient underwent a digital screening mammogram on 09/20/2007 in which additional evaluation of the left breast was indicated. No specific mammographic evidence of malignancy of the right breast.   2.The patient had a left breast ultrasound on 10/14/2007 which showed suspicious findings and recommended stereotactic biopsy of microcalcifications in the left breast at the 3 o'clock position. The ultrasound also recommended a six-month follow-up for probable fibroadenoma in the left breast at the 9 o'clock position.   3. On 10/15/2007 the patient had a MRI of bilateral breasts which showed a 4.4 x 2.7 x 1.5 cm biopsy proven ductal carcinoma in situ at the 3 o'clock position of the left breast. The patient also had multiple bilateral fibroadenomas that do not require follow-up. The MRI also showed to liver cysts.   4.The patient underwent a lumpectomy on 11/08/2007 for a T1b N55m invasive ductal carcinoma with DCIS, grade 2, with 1/1 micrometastasis lymph node, ER 100%, PR 100%, Ki-67 10%, HER2/neu no amplification.   5. Oncotype score of 10 with an average rate of distant recurrence at 7%.   6. Status post radiation therapy from 01/24/2008 through 03/20/2008.   7. The patient started anti-estrogen therapy with Tamoxifen from 02/2008 through 09/2010.The patient was then switched to Anastrozole and has been taking this medication since 09/2010.  Current therapy:  Arimidex 150mdaily  Interim History: Ellen GONSALEZ89.o. 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  She is doing well taking the arimidex daily.  She is tolerating it well.  She denies hot flashes, joint aches, vaginal dryness, new pain,  unintentional weight loss or any further concerns.  She does have dry skin on her hands that she uses a specific cream for.  She wants to know if she should or should not be taking the Fosamax.  She really doesn't like to take it, and wants to know why she is taking it in the first place. Otherwise, a 10 point ROS is neg.  We reviewed her health maintenance below.    Medications:  Current Outpatient Prescriptions  Medication Sig Dispense Refill  . amLODipine (NORVASC) 5 MG tablet Take 1 tablet (5 mg total) by mouth daily.  30 tablet  11  . anastrozole (ARIMIDEX) 1 MG tablet       . calcium-vitamin D (OSCAL WITH D) 500-200 MG-UNIT per tablet Take 1 tablet by mouth 2 (two) times daily.      . cholecalciferol (VITAMIN D) 1000 UNITS tablet Take 1,000 Units by mouth daily.      . pravastatin (PRAVACHOL) 40 MG tablet Take 1 tablet (40 mg total) by mouth daily.  30 tablet  11  . alendronate (FOSAMAX) 70 MG tablet Take 1 tablet (70 mg total) by mouth every 7 (seven) days. Saturday.Take with a full glass of water on an empty stomach.  4 tablet  12  . Blood Pressure Monitoring (BLOOD PRESSURE MONITOR/WRIST) DEVI 1 Device by Does not apply route daily.  1 Device  0   No current facility-administered medications for this visit.     Allergies:  Allergies  Allergen Reactions  . Ace Inhibitors   . Simvastatin Swelling    lips    Medical History:  Past Medical History  Diagnosis Date  . HTN (hypertension)   . Breast CA 10/2007    S/P Surgery and Radiation Tx completed 03/20/08  . Thyroid goiter 03/2010    FNA of Left Thyroid Nodule - 05/14/2010  . Hyperlipidemia     Surgical History:  Past Surgical History  Procedure Laterality Date  . Abdominal hysterectomy  1986    Heavy bleeding  . Breast surgery  11/08/2007    Left partial mastectomy (lumpectomy)  . Breast biopsy  12/01/2007    Left axillary sentinal node bx : positive     Review of Systems: A 10 point review of systems was  conducted and is otherwise negative except for what is noted above.    Health Maintenance  Mammogram: 09/27/12 Colonoscopy:  06/06/2008 Bone Density Scan:  09/23/2010, normal Pap Smear: s/p TAH Eye Exam: 2015 Vitamin D Level: 11/15/12, 46 Lipid Panel: 04/28/13   Physical Exam: Blood pressure 150/89, pulse 71, temperature 98.8 F (37.1 C), temperature source Oral, resp. rate 20, height 5' 2" (1.575 m), weight 184 lb 1.6 oz (83.507 kg). GENERAL: Patient is a well appearing female in no acute distress HEENT:  Sclerae anicteric.  Oropharynx clear and moist. No ulcerations or evidence of oropharyngeal candidiasis. Neck is supple.  NODES:  No cervical, supraclavicular, or axillary lymphadenopathy palpated.  BREAST EXAM:  Left breast well healed surgical scar, no nodularity, no nodules, no masses, no skin changes or lesions, right breast with no nodules, masses, skin or nipple changes, benign bilateral breast exam.  LUNGS:  Clear to auscultation bilaterally.  No wheezes or rhonchi. HEART:  Regular rate and rhythm. No murmur appreciated. ABDOMEN:  Soft, nontender.  Positive, normoactive bowel sounds. No organomegaly palpated. MSK:  No focal spinal tenderness to palpation. Full range of motion bilaterally in the upper extremities. EXTREMITIES:  No peripheral edema.   SKIN:  Clear with no obvious rashes or skin changes. No nail dyscrasia. NEURO:  Nonfocal. Well oriented.  Appropriate affect. ECOG PERFORMANCE STATUS: 1 - Symptomatic but completely ambulatory   Lab Results: Lab Results  Component Value Date   WBC 7.2 06/03/2013   HGB 12.6 06/03/2013   HCT 38.2 06/03/2013   MCV 84.0 06/03/2013   PLT 336 06/03/2013     Chemistry      Component Value Date/Time   NA 140 06/03/2013 1436   NA 144 04/28/2013 1523   K 3.7 06/03/2013 1436   K 4.5 04/28/2013 1523   CL 104 04/28/2013 1523   CL 106 05/06/2012 1430   CO2 26 06/03/2013 1436   CO2 28 04/28/2013 1523   BUN 13.4 06/03/2013 1436   BUN 14 04/28/2013 1523    CREATININE 0.8 06/03/2013 1436   CREATININE 0.76 04/28/2013 1523   CREATININE 0.71 10/02/2011 1555   CREATININE 0.8 07/25/2010   GLU 89 07/25/2010      Component Value Date/Time   CALCIUM 9.6 06/03/2013 1436   CALCIUM 9.4 04/28/2013 1523   ALKPHOS 86 06/03/2013 1436   ALKPHOS 56 10/02/2011 1555   AST 15 06/03/2013 1436   AST 16 10/02/2011 1555   ALT 12 06/03/2013 1436   ALT 15 04/28/2013 1523   BILITOT 0.26 06/03/2013 1436   BILITOT 0.2* 10/02/2011 1555       Assessment and Plan: Coralyn Helling 63 y.o. female with  1. Stage IB ER/PR positive invasive ductal carcinoma of the right breast.  The patient is doing very well today.  She has underwent lumpectomy followed by radiation,  and then anti-estrogen therapy with Tamoxifen for two years and has been on Arimidex since August, 2012.  She is tolerating the Arimidex relatively well.  She will continue this.  She has no sign of recurrence.  2.  Bone density.  For some reason the patient has been on Fosamax in the past.  She has recently stopped taking it.  The only bone density I have on record is from 2012 and this was normal.  She cannot recall another bone density and whether or not it was actually performed. I will order another one today.  I told her not to take the Fosamax and I would call her with her bone density results.  3. Survivorship:  We reviewed her health maintenance which is mostly up to date as discussed above.  She has no sign of recurrence.  She knows to keep her mammograms current and I encouraged her to perform monthly self breast exams.  A healthy diet and routine exercise most days of the week was also recommended.   The patient will return in 6 months for labs and evaluation.  She knows to contact us in the interim should she have any questions or concerns.  We can certainly see her sooner if needed.    I spent 25 minutes counseling the patient face to face.  The total time spent in the appointment was 30 minutes.  Minette Headland, [place] [phone] 06/05/2013 7:08 AM

## 2013-06-03 NOTE — Patient Instructions (Signed)
You are doing very well.  Perform monthly self breast exams.  You have no sign of recurrence.  Continue Arimidex 1mg  daily.  Please call us if you have any questions or concerns.

## 2013-06-10 ENCOUNTER — Other Ambulatory Visit: Payer: Self-pay | Admitting: *Deleted

## 2013-06-10 DIAGNOSIS — Z853 Personal history of malignant neoplasm of breast: Secondary | ICD-10-CM

## 2013-06-10 MED ORDER — ANASTROZOLE 1 MG PO TABS: 1.0000 mg | ORAL_TABLET | Freq: Every day | ORAL | Status: DC

## 2013-06-15 ENCOUNTER — Other Ambulatory Visit: Payer: Self-pay | Admitting: Family

## 2013-06-15 ENCOUNTER — Ambulatory Visit (AMBULATORY_SURGERY_CENTER): Payer: Self-pay | Admitting: *Deleted

## 2013-06-15 ENCOUNTER — Other Ambulatory Visit: Payer: Self-pay

## 2013-06-15 VITALS — Ht 62.0 in | Wt 185.0 lb

## 2013-06-15 DIAGNOSIS — Z853 Personal history of malignant neoplasm of breast: Secondary | ICD-10-CM

## 2013-06-15 DIAGNOSIS — Z86010 Personal history of colonic polyps: Secondary | ICD-10-CM

## 2013-06-15 MED ORDER — MOVIPREP 100 G PO SOLR: 1.0000 | Freq: Once | ORAL | Status: DC

## 2013-06-15 NOTE — Progress Notes (Signed)
No egg or soy allergy. No anesthesia problems.  No home O2.  No diet meds.  

## 2013-06-16 ENCOUNTER — Other Ambulatory Visit: Payer: Managed Care, Other (non HMO)

## 2013-06-20 ENCOUNTER — Encounter (INDEPENDENT_AMBULATORY_CARE_PROVIDER_SITE_OTHER): Payer: Self-pay

## 2013-06-20 ENCOUNTER — Ambulatory Visit
Admission: RE | Admit: 2013-06-20 | Discharge: 2013-06-20 | Disposition: A | Discharge status: Home / Self Care | Payer: Managed Care, Other (non HMO) | Source: Ambulatory Visit | Attending: Adult Health | Admitting: Adult Health

## 2013-06-20 DIAGNOSIS — E2839 Other primary ovarian failure: Secondary | ICD-10-CM

## 2013-06-22 ENCOUNTER — Encounter: Payer: Self-pay | Admitting: Gastroenterology

## 2013-06-23 ENCOUNTER — Telehealth: Payer: Self-pay

## 2013-06-23 NOTE — Telephone Encounter (Signed)
Message copied by Prentiss Bells on Thu Jun 23, 2013 10:25 AM ------      Message from: Minette Headland      Created: Wed Jun 22, 2013  9:49 PM       Please call patient with results.  Bone density is slightly low.  I recommend calcium and vitamin d supplementation along with weight bearing exercises.            Thanks,       [place]      ----- Message -----         From: Rad Results In Interface         Sent: 06/22/2013  12:45 PM           To: Minette Headland, NP                   ------

## 2013-06-23 NOTE — Telephone Encounter (Signed)
LMVOM rquesting pt call back.

## 2013-06-24 NOTE — Telephone Encounter (Signed)
LMOVM - pt call back.

## 2013-06-28 ENCOUNTER — Telehealth: Payer: Self-pay

## 2013-06-28 NOTE — Telephone Encounter (Signed)
Message copied by Prentiss Bells on Tue Jun 28, 2013  1:38 PM ------      Message from: Minette Headland      Created: Wed Jun 22, 2013  9:49 PM       Please call patient with results.  Bone density is slightly low.  I recommend calcium and vitamin d supplementation along with weight bearing exercises.            Thanks,       [place]      ----- Message -----         From: Rad Results In Interface         Sent: 06/22/2013  12:45 PM           To: Minette Headland, NP                   ------

## 2013-06-28 NOTE — Telephone Encounter (Signed)
Pre note below, have left several messages with no return call from patient.  Note and results printed and mailed to patient.

## 2013-06-29 ENCOUNTER — Encounter: Payer: Self-pay | Admitting: Gastroenterology

## 2013-06-29 ENCOUNTER — Ambulatory Visit (AMBULATORY_SURGERY_CENTER): Payer: Managed Care, Other (non HMO) | Admitting: Gastroenterology

## 2013-06-29 VITALS — BP 151/84 | HR 70 | Temp 98.1°F | Resp 18 | Ht 62.0 in | Wt 185.0 lb

## 2013-06-29 DIAGNOSIS — Z86010 Personal history of colonic polyps: Secondary | ICD-10-CM

## 2013-06-29 DIAGNOSIS — K5730 Diverticulosis of large intestine without perforation or abscess without bleeding: Secondary | ICD-10-CM

## 2013-06-29 DIAGNOSIS — D126 Benign neoplasm of colon, unspecified: Secondary | ICD-10-CM

## 2013-06-29 MED ORDER — SODIUM CHLORIDE 0.9 % IV SOLN: 500.0000 mL | INTRAVENOUS | Status: DC

## 2013-06-29 NOTE — Progress Notes (Signed)
Called to room to assist during endoscopic procedure.  Patient ID and intended procedure confirmed with present staff. Received instructions for my participation in the procedure from the performing physician.  

## 2013-06-29 NOTE — Op Note (Signed)
[facility]  [address] [place], [id number]   COLONOSCOPY PROCEDURE REPORT  PATIENT: Ellen [name], Ellen [name]  MR#: [id number] BIRTHDATE: 07-Apr-1950 , 62  yrs. old GENDER: Female ENDOSCOPIST: Milus Banister, MD PROCEDURE DATE:  06/29/2013 PROCEDURE:   Colonoscopy with snare polypectomy First Screening Colonoscopy - Avg.  risk and is 50 yrs.  old or older - No.  Prior Negative Screening - Now for repeat screening. N/A  History of Adenoma - Now for follow-up colonoscopy & has been > or = to 3 yrs.  Yes hx of adenoma.  Has been 3 or more years since last colonoscopy.  Polyps Removed Today? Yes. ASA CLASS:   Class II INDICATIONS:three adenomatous polyps 2007; no polyps 2010. MEDICATIONS: MAC sedation, administered by CRNA and propofol (Diprivan) 250mg  IV  DESCRIPTION OF PROCEDURE:   After the risks benefits and alternatives of the procedure were thoroughly explained, informed consent was obtained.  A digital rectal exam revealed no abnormalities of the rectum.   The LB [id number]  endoscope was introduced through the anus and advanced to the cecum, which was identified by both the appendix and ileocecal valve. No adverse events experienced.   The quality of the prep was excellent.  The instrument was then slowly withdrawn as the colon was fully examined.   COLON FINDINGS: Two polyps were found, removed and sent to pathology.  These were both sessile, 2-37mm across, located in descending and sigmoid segment, removed with cold snare.  There were a few diverticulum in the left colon.  The examination was otherwise normal.  Retroflexed views revealed no abnormalities. The time to cecum=1 minutes 36 seconds.  Withdrawal time=10 minutes 57 seconds.  The scope was withdrawn and the procedure completed. COMPLICATIONS: There were no complications.  ENDOSCOPIC IMPRESSION: Two polyps were found, removed and sent to pathology. There were a few diverticulum in the left  colon.  The examination was otherwise normal.  RECOMMENDATIONS: If the polyp(s) removed today are proven to be adenomatous (pre-cancerous) polyps, you will need a repeat colonoscopy in 5 years.  You will receive a letter within 1-2 weeks with the results of your biopsy as well as final recommendations.  Please call my office if you have not received a letter after 3 weeks.  eSigned:  Milus Banister, MD 06/29/2013 9:51 AM

## 2013-06-29 NOTE — Progress Notes (Signed)
  [name] Post-op Note  Patient: Ellen [name]  Procedure(s) Performed: colonoscopy  Patient Location: [facility] - Recovery Area  Anesthesia Type: Deep Sedation/Propofol  Level of Consciousness: awake, oriented and patient cooperative  Airway and Oxygen Therapy: Patient Spontanous Breathing  Post-op Pain: none  Post-op Assessment:  Post-op Vital signs reviewed, Patient's Cardiovascular Status Stable, Respiratory Function Stable, Patent Airway, No signs of Nausea or vomiting and Pain level controlled  Post-op Vital Signs: Reviewed and stable  Complications: No apparent anesthesia complications  Jeret Goyer E Takeesha Isley 9:51 AM

## 2013-06-29 NOTE — Patient Instructions (Signed)
YOU HAD AN ENDOSCOPIC PROCEDURE TODAY AT THE [facility]: Refer to the procedure report that was given to you for any specific questions about what was found during the examination.  If the procedure report does not answer your questions, please call your gastroenterologist to clarify.  If you requested that your care partner not be given the details of your procedure findings, then the procedure report has been included in a sealed envelope for you to review at your convenience later.  YOU SHOULD EXPECT: Some feelings of bloating in the abdomen. Passage of more gas than usual.  Walking can help get rid of the air that was put into your GI tract during the procedure and reduce the bloating. If you had a lower endoscopy (such as a colonoscopy or flexible sigmoidoscopy) you may notice spotting of blood in your stool or on the toilet paper. If you underwent a bowel prep for your procedure, then you may not have a normal bowel movement for a few days.  DIET: Your first meal following the procedure should be a light meal and then it is ok to progress to your normal diet.  A half-sandwich or bowl of soup is an example of a good first meal.  Heavy or fried foods are harder to digest and may make you feel nauseous or bloated.  Likewise meals heavy in dairy and vegetables can cause extra gas to form and this can also increase the bloating.  Drink plenty of fluids but you should avoid alcoholic beverages for 24 hours.  ACTIVITY: Your care partner should take you home directly after the procedure.  You should plan to take it easy, moving slowly for the rest of the day.  You can resume normal activity the day after the procedure however you should NOT DRIVE or use heavy machinery for 24 hours (because of the sedation medicines used during the test).    SYMPTOMS TO REPORT IMMEDIATELY: A gastroenterologist can be reached at any hour.  During normal business hours, 8:30 AM to 5:00 PM Monday through Friday,  call [phone].  After hours and on weekends, please call the GI answering service at [phone] who will take a message and have the physician on call contact you.   Following lower endoscopy (colonoscopy or flexible sigmoidoscopy):  Excessive amounts of blood in the stool  Significant tenderness or worsening of abdominal pains  Swelling of the abdomen that is new, acute  Fever of 100F or higher   FOLLOW UP: If any biopsies were taken you will be contacted by phone or by letter within the next 1-3 weeks.  Call your gastroenterologist if you have not heard about the biopsies in 3 weeks.  Our staff will call the home number listed on your records the next business day following your procedure to check on you and address any questions or concerns that you may have at that time regarding the information given to you following your procedure. This is a courtesy call and so if there is no answer at the home number and we have not heard from you through the emergency physician on call, we will assume that you have returned to your regular daily activities without incident.  SIGNATURES/CONFIDENTIALITY: You and/or your care partner have signed paperwork which will be entered into your electronic medical record.  These signatures attest to the fact that that the information above on your After Visit Summary has been reviewed and is understood.  Full responsibility of the confidentiality of   this discharge information lies with you and/or your care-partner.   Resume medications. Information given on polyps,diverticulosis and high fiber diet with discharge instructions. 

## 2013-06-30 ENCOUNTER — Telehealth: Payer: Self-pay | Admitting: *Deleted

## 2013-06-30 NOTE — Telephone Encounter (Signed)
Left message that we called for f/u 

## 2013-07-04 NOTE — Progress Notes (Signed)
Report rcvd from [facility] dtd 06/20/13 - bone density.  Original sent to scan.  Copy to [place].

## 2013-07-05 ENCOUNTER — Encounter: Payer: Self-pay | Admitting: Gastroenterology

## 2013-07-27 ENCOUNTER — Telehealth: Payer: Self-pay | Admitting: *Deleted

## 2013-07-27 NOTE — Telephone Encounter (Signed)
Per Mendel Ryder, NP, I informed patient about her bone density results. Informed patient to continue Calcium, Vitamin D and weight bearing exercises. Patient verbalized understanding.

## 2013-09-27 ENCOUNTER — Other Ambulatory Visit: Payer: Self-pay | Admitting: Adult Health

## 2013-09-27 DIAGNOSIS — Z853 Personal history of malignant neoplasm of breast: Secondary | ICD-10-CM

## 2013-09-28 ENCOUNTER — Ambulatory Visit
Admission: RE | Admit: 2013-09-28 | Discharge: 2013-09-28 | Disposition: A | Discharge status: Home / Self Care | Payer: Managed Care, Other (non HMO) | Source: Ambulatory Visit | Attending: Family | Admitting: Family

## 2013-09-28 ENCOUNTER — Encounter (INDEPENDENT_AMBULATORY_CARE_PROVIDER_SITE_OTHER): Payer: Self-pay

## 2013-09-28 DIAGNOSIS — Z853 Personal history of malignant neoplasm of breast: Secondary | ICD-10-CM

## 2013-11-03 ENCOUNTER — Ambulatory Visit: Payer: Managed Care, Other (non HMO) | Admitting: Family Medicine

## 2013-11-09 ENCOUNTER — Ambulatory Visit (INDEPENDENT_AMBULATORY_CARE_PROVIDER_SITE_OTHER): Payer: Managed Care, Other (non HMO) | Admitting: Family Medicine

## 2013-11-09 ENCOUNTER — Encounter: Payer: Self-pay | Admitting: Family Medicine

## 2013-11-09 VITALS — BP 134/72 | HR 69 | Temp 98.8°F | Resp 16 | Ht 62.0 in | Wt 182.0 lb

## 2013-11-09 DIAGNOSIS — I10 Essential (primary) hypertension: Secondary | ICD-10-CM

## 2013-11-09 DIAGNOSIS — Z86010 Personal history of colonic polyps: Secondary | ICD-10-CM

## 2013-11-09 DIAGNOSIS — E669 Obesity, unspecified: Secondary | ICD-10-CM

## 2013-11-09 MED ORDER — ASPIRIN EC 325 MG PO TBEC: 325.0000 mg | DELAYED_RELEASE_TABLET | Freq: Every day | ORAL | Status: DC

## 2013-11-09 NOTE — Patient Instructions (Addendum)
Mediterranean Diet  Why follow it? Research shows.   Those who follow the Mediterranean diet have a reduced risk of heart disease    The diet is associated with a reduced incidence of Parkinson's and Alzheimer's diseases   People following the diet may have longer life expectancies and lower rates of chronic diseases    The Dietary Guidelines for Americans recommends the Mediterranean diet as an eating plan to promote health and prevent disease  What Is the Mediterranean Diet?    Healthy eating plan based on typical foods and recipes of Mediterranean-style cooking   The diet is primarily a plant based diet; these foods should make up a majority of meals   Starches - Plant based foods should make up a majority of meals - They are an important sources of vitamins, minerals, energy, antioxidants, and fiber - Choose whole grains, foods high in fiber and minimally processed items  - Typical grain sources include wheat, oats, barley, corn, brown rice, bulgar, farro, millet, polenta, couscous  - Various types of beans include chickpeas, lentils, fava beans, black beans, white beans   Fruits  Veggies - Large quantities of antioxidant rich fruits & veggies; 6 or more servings  - Vegetables can be eaten raw or lightly drizzled with oil and cooked  - Vegetables common to the traditional Mediterranean Diet include: artichokes, arugula, beets, broccoli, brussel sprouts, cabbage, carrots, celery, collard greens, cucumbers, eggplant, kale, leeks, lemons, lettuce, mushrooms, okra, onions, peas, peppers, potatoes, pumpkin, radishes, rutabaga, shallots, spinach, sweet potatoes, turnips, zucchini - Fruits common to the Mediterranean Diet include: apples, apricots, avocados, cherries, clementines, dates, figs, grapefruits, grapes, melons, nectarines, oranges, peaches, pears, pomegranates, strawberries, tangerines  Fats - Replace butter and margarine with healthy oils, such as olive oil, canola oil, and tahini   - Limit nuts to no more than a handful a day  - Nuts include walnuts, almonds, pecans, pistachios, pine nuts  - Limit or avoid candied, honey roasted or heavily salted nuts - Olives are central to the Mediterranean diet - can be eaten whole or used in a variety of dishes   Meats Protein - Limiting red meat: no more than a few times a month - When eating red meat: choose lean cuts and keep the portion to the size of deck of cards - Eggs: approx. 0 to 4 times a week  - Fish and lean poultry: at least 2 a week  - Healthy protein sources include, chicken, turkey, lean beef, lamb - Increase intake of seafood such as tuna, salmon, trout, mackerel, shrimp, scallops - Avoid or limit high fat processed meats such as sausage and bacon  Dairy - Include moderate amounts of low fat dairy products  - Focus on healthy dairy such as fat free yogurt, skim milk, low or reduced fat cheese - Limit dairy products higher in fat such as whole or 2% milk, cheese, ice cream  Alcohol - Moderate amounts of red wine is ok  - No more than 5 oz daily for women (all ages) and men older than age 65  - No more than 10 oz of wine daily for men younger than 65  Other - Limit sweets and other desserts  - Use herbs and spices instead of salt to flavor foods  - Herbs and spices common to the traditional Mediterranean Diet include: basil, bay leaves, chives, cloves, cumin, fennel, garlic, lavender, marjoram, mint, oregano, parsley, pepper, rosemary, sage, savory, sumac, tarragon, thyme   It's not just a diet,   it's a lifestyle:    The Mediterranean diet includes lifestyle factors typical of those in the region    Foods, drinks and meals are best eaten with others and savored   Daily physical activity is important for overall good health   This could be strenuous exercise like running and aerobics   This could also be more leisurely activities such as walking, housework, yard-work, or taking the stairs   Moderation is the key;  a balanced and healthy diet accommodates most foods and drinks   Consider portion sizes and frequency of consumption of certain foods   Meal Ideas & Options:    Breakfast:  o Whole wheat toast or whole wheat English muffins with peanut butter & hard boiled egg o Steel cut oats topped with apples & cinnamon and skim milk  o Fresh fruit: banana, strawberries, melon, berries, peaches  o Smoothies: strawberries, bananas, greek yogurt, peanut butter o Low fat greek yogurt with blueberries and granola  o Egg white omelet with spinach and mushrooms o Breakfast couscous: whole wheat couscous, apricots, skim milk, cranberries    Sandwiches:  o Hummus and grilled vegetables (peppers, zucchini, squash) on whole wheat bread   o Grilled chicken on whole wheat pita with lettuce, tomatoes, cucumbers or tzatziki  o Tuna salad on whole wheat bread: tuna salad made with greek yogurt, olives, red peppers, capers, green onions o Garlic rosemary lamb pita: lamb sauted with garlic, rosemary, salt & pepper; add lettuce, cucumber, greek yogurt to pita - flavor with lemon juice and black pepper    Seafood:  o Mediterranean grilled salmon, seasoned with garlic, basil, parsley, lemon juice and black pepper o Shrimp, lemon, and spinach whole-grain pasta salad made with low fat greek yogurt  o Seared scallops with lemon orzo  o Seared tuna steaks seasoned salt, pepper, coriander topped with tomato mixture of olives, tomatoes, olive oil, minced garlic, parsley, green onions and cappers    Meats:  o Herbed greek chicken salad with kalamata olives, cucumber, feta  o Red bell peppers stuffed with spinach, bulgur, lean ground beef (or lentils) & topped with feta   o Kebabs: skewers of chicken, tomatoes, onions, zucchini, squash  o [place] burgers: made with red onions, mint, dill, lemon juice, feta cheese topped with roasted red peppers   Vegetarian o Cucumber salad: cucumbers, artichoke hearts, celery, red onion, feta  cheese, tossed in olive oil & lemon juice  o Hummus and whole grain pita points with a greek salad (lettuce, tomato, feta, olives, cucumbers, red onion) o Lentil soup with celery, carrots made with vegetable broth, garlic, salt and pepper  o Tabouli salad: parsley, bulgur, mint, scallions, cucumbers, tomato, radishes, lemon juice, olive oil, salt and pepper.     Daily Aspirin has shown to be effective in reduction of adenomatous poylps. I have prescribed 1 EC-ASA to be taken daily.

## 2013-11-10 NOTE — Progress Notes (Signed)
S:  This 63 y.o. AA female returns for HTN follow-up. She is compliant w/ medications and reports no adverse effects. She denies diaphoresis, fatigue, vision disturbances, CP or tightness, palpitations, SOB or DOE, cough. GI problems, abd/back pain, HA, dizziness, numbness, weakness or syncope. Pt maintains good nutrition but no regular exercise.  Pt has some minor concerns about weight; she is not sure about the safety of walking in her neighborhood. Work hours prohibit regular fitness activities. She does admit that she can make some modifications in diet. Pt is taking Alendronate for low bone mass.  HCM- pt had CRS in May 2015; she has hx of sessile polyps (benign). Recall is 5 years. We had a discussion about the risk versus benefit of NSAID/ ASA to reduce likelihood of pre-cancerous polyps. Pt does not have aspirin or NSAID allergy.   Patient Active Problem List   Diagnosis Date Noted  . Hyperlipidemia 12/11/2003    Priority: Medium    Class: Chronic  . Goiter 04/28/2013  . History of tobacco use 11/05/2011  . History of breast cancer 11/04/2011  . Obesity (BMI 30-39.9) 04/03/2011  . HTN (hypertension)   . COLONIC POLYPS, ADENOMATOUS, HX OF 05/02/2008    Prior to Admission medications   Medication Sig Start Date End Date Taking? Authorizing Provider  alendronate (FOSAMAX) 70 MG tablet Take 1 tablet (70 mg total) by mouth every 7 (seven) days. Saturday.Take with a full glass of water on an empty stomach. 05/06/12  Yes Vivien Rota, NP  amLODipine (NORVASC) 5 MG tablet Take 1 tablet (5 mg total) by mouth daily. 04/28/13  Yes Barton Fanny, MD  anastrozole (ARIMIDEX) 1 MG tablet Take 1 tablet (1 mg total) by mouth daily. 06/10/13  Yes Deatra Robinson, MD  calcium-vitamin D (OSCAL WITH D) 500-200 MG-UNIT per tablet Take 1 tablet by mouth 2 (two) times daily.   Yes Historical Provider, MD  cholecalciferol (VITAMIN D) 1000 UNITS tablet Take 1,000 Units by mouth daily.   Yes  Historical Provider, MD  pravastatin (PRAVACHOL) 40 MG tablet Take 1 tablet (40 mg total) by mouth daily. 04/28/13  Yes Barton Fanny, MD    History   Social History  . Marital Status: Divorced    Spouse Name: N/A    Number of Children: N/A  . Years of Education: N/A   Occupational History  . Not on file.   Social History Main Topics  . Smoking status: Former Smoker    Types: Cigarettes    Quit date: 02/25/2007  . Smokeless tobacco: Never Used  . Alcohol Use: No  . Drug Use: No  . Sexual Activity: Not Currently   Other Topics Concern  . Not on file   Social History Narrative   Widowed. Education: [employer].    Family History  Problem Relation Age of Onset  . Cancer Father     Died at 80 of Liver CA  . Diabetes Father   . Colon cancer Neg Hx     ROS: As per HPI.  O: Filed Vitals:   11/09/13 1120  BP: 134/72  Pulse: 69  Temp: 98.8 F (37.1 C)  Resp: 16    GEN: In NAD; WN,WD. HENT: [employer]/AT; EOMI w/ clear conj/sclerae. Otherwise unremarkable. COR: RRR. No edema. LUNGS: Normal resp rate and effort. SKIN: W&D; intact w/o diaphoresis, erythema or lesions. MS: MAEs; no deformities or muscle atrophy. NEURO: A&O x 3; CNs intact. Nonfocal.  A/P: Essential hypertension- Stable on current medications.  Obesity (BMI 30-39.9)- Encouraged some physical activity beside that associated w/ work; walking is best type of exercise to start with if time constraints are a consideration.  COLONIC POLYPS, ADENOMATOUS, HX OF- Advised daily enteric-coated ASA.  Meds ordered this encounter  Medications  . aspirin EC 325 MG tablet    Sig: Take 1 tablet (325 mg total) by mouth daily.    Dispense:  90 tablet    Refill:  1

## 2013-12-02 ENCOUNTER — Other Ambulatory Visit (HOSPITAL_BASED_OUTPATIENT_CLINIC_OR_DEPARTMENT_OTHER): Payer: Managed Care, Other (non HMO)

## 2013-12-02 ENCOUNTER — Ambulatory Visit (HOSPITAL_BASED_OUTPATIENT_CLINIC_OR_DEPARTMENT_OTHER): Payer: Managed Care, Other (non HMO) | Admitting: Adult Health

## 2013-12-02 ENCOUNTER — Telehealth: Payer: Self-pay | Admitting: Hematology and Oncology

## 2013-12-02 VITALS — BP 141/73 | HR 75 | Temp 98.5°F | Resp 18 | Ht 62.0 in | Wt 184.2 lb

## 2013-12-02 DIAGNOSIS — C50812 Malignant neoplasm of overlapping sites of left female breast: Secondary | ICD-10-CM

## 2013-12-02 DIAGNOSIS — Z170 Estrogen receptor positive status [ER+]: Secondary | ICD-10-CM

## 2013-12-02 DIAGNOSIS — Z853 Personal history of malignant neoplasm of breast: Secondary | ICD-10-CM

## 2013-12-02 LAB — COMPREHENSIVE METABOLIC PANEL (CC13)
ALBUMIN: 3.7 g/dL (ref 3.5–5.0)
ALK PHOS: 85 U/L (ref 40–150)
ALT: 16 U/L (ref 0–55)
ANION GAP: 8 meq/L (ref 3–11)
AST: 16 U/L (ref 5–34)
BUN: 15.8 mg/dL (ref 7.0–26.0)
CO2: 24 mEq/L (ref 22–29)
Calcium: 9.4 mg/dL (ref 8.4–10.4)
Chloride: 107 mEq/L (ref 98–109)
Creatinine: 0.8 mg/dL (ref 0.6–1.1)
GLUCOSE: 106 mg/dL (ref 70–140)
Potassium: 3.5 mEq/L (ref 3.5–5.1)
SODIUM: 140 meq/L (ref 136–145)
TOTAL PROTEIN: 7.7 g/dL (ref 6.4–8.3)

## 2013-12-02 LAB — CBC WITH DIFFERENTIAL/PLATELET
BASO%: 0.6 % (ref 0.0–2.0)
Basophils Absolute: 0 10*3/uL (ref 0.0–0.1)
EOS ABS: 0.1 10*3/uL (ref 0.0–0.5)
EOS%: 1.8 % (ref 0.0–7.0)
HEMATOCRIT: 38.8 % (ref 34.8–46.6)
HGB: 12.3 g/dL (ref 11.6–15.9)
LYMPH#: 2.5 10*3/uL (ref 0.9–3.3)
LYMPH%: 31.1 % (ref 14.0–49.7)
MCH: 26.6 pg (ref 25.1–34.0)
MCHC: 31.7 g/dL (ref 31.5–36.0)
MCV: 83.7 fL (ref 79.5–101.0)
MONO#: 0.7 10*3/uL (ref 0.1–0.9)
MONO%: 8.1 % (ref 0.0–14.0)
NEUT#: 4.8 10*3/uL (ref 1.5–6.5)
NEUT%: 58.4 % (ref 38.4–76.8)
Platelets: 355 10*3/uL (ref 145–400)
RBC: 4.64 10*6/uL (ref 3.70–5.45)
RDW: 14.7 % — ABNORMAL HIGH (ref 11.2–14.5)
WBC: 8.1 10*3/uL (ref 3.9–10.3)

## 2013-12-02 NOTE — Progress Notes (Signed)
Hematology and Oncology Follow Up Visit  ED RAYSON [id number] 1950-07-03 63 y.o. 12/05/2013 12:34 AM     Principle Diagnosis:Ellen [name] 63 y.o. 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   Prior Therapy:  1.The patient underwent a digital screening mammogram on 09/20/2007 in which additional evaluation of the left breast was indicated. No specific mammographic evidence of malignancy of the right breast.   2.The patient had a left breast ultrasound on 10/14/2007 which showed suspicious findings and recommended stereotactic biopsy of microcalcifications in the left breast at the 3 o'clock position. The ultrasound also recommended a six-month follow-up for probable fibroadenoma in the left breast at the 9 o'clock position.   3. On 10/15/2007 the patient had a MRI of bilateral breasts which showed a 4.4 x 2.7 x 1.5 cm biopsy proven ductal carcinoma in situ at the 3 o'clock position of the left breast. The patient also had multiple bilateral fibroadenomas that do not require follow-up. The MRI also showed to liver cysts.   4.The patient underwent a lumpectomy on 11/08/2007 for a T1b N29m invasive ductal carcinoma with DCIS, grade 2, with 1/1 micrometastasis lymph node, ER 100%, PR 100%, Ki-67 10%, HER2/neu no amplification.   5. Oncotype score of 10 with an average rate of distant recurrence at 7%.   6. Status post radiation therapy from 01/24/2008 through 03/20/2008.   7. The patient started anti-estrogen therapy with Tamoxifen from 02/2008 through 09/2010.The patient was then switched to Anastrozole and has been taking this medication since 09/2010.  Current therapy:  Arimidex 126mdaily  Interim History: Ellen CULLIVER63 y.o. 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  She is doing well taking the arimidex daily.  She is tolerating it well.  She has no difficulty with it whatsoever and has no new  complaints whatsoever.  We updated her health maintenance below.      Medications:  Current Outpatient Prescriptions  Medication Sig Dispense Refill  . amLODipine (NORVASC) 5 MG tablet Take 1 tablet (5 mg total) by mouth daily.  30 tablet  11  . anastrozole (ARIMIDEX) 1 MG tablet Take 1 tablet (1 mg total) by mouth daily.  90 tablet  1  . aspirin EC 325 MG tablet Take 1 tablet (325 mg total) by mouth daily.  90 tablet  1  . calcium-vitamin D (OSCAL WITH D) 500-200 MG-UNIT per tablet Take 1 tablet by mouth 2 (two) times daily.      . cholecalciferol (VITAMIN D) 1000 UNITS tablet Take 1,000 Units by mouth daily.      . pravastatin (PRAVACHOL) 40 MG tablet Take 1 tablet (40 mg total) by mouth daily.  30 tablet  11  . alendronate (FOSAMAX) 70 MG tablet Take 1 tablet (70 mg total) by mouth every 7 (seven) days. Saturday.Take with a full glass of water on an empty stomach.  4 tablet  12   No current facility-administered medications for this visit.     Allergies:  Allergies  Allergen Reactions  . Ace Inhibitors   . Simvastatin Swelling    lips    Medical History: Past Medical History  Diagnosis Date  . HTN (hypertension)   . Breast CA 10/2007    S/P Surgery and Radiation Tx completed 03/20/08  . Thyroid goiter 03/2010    FNA of Left Thyroid Nodule - 05/14/2010  . Hyperlipidemia     Surgical History:  Past Surgical  History  Procedure Laterality Date  . Abdominal hysterectomy  1986    Heavy bleeding  . Breast surgery  11/08/2007    Left partial mastectomy (lumpectomy)  . Breast biopsy  12/01/2007    Left axillary sentinal node bx : positive  . Colonoscopy  2010     Review of Systems: A 10 point review of systems was conducted and is otherwise negative except for what is noted above.    Health Maintenance  Mammogram: 09/28/2013 Colonoscopy:  2015, 5 year f/u recommended Bone Density Scan:  2014,barely low normal Pap Smear: s/p TAH Eye Exam: 2015 Vitamin D Level:  11/15/12, 46 Lipid Panel: 04/28/13   Physical Exam: Blood pressure 141/73, pulse 75, temperature 98.5 F (36.9 C), temperature source Oral, resp. rate 18, height 5\' 2"  (1.575 m), weight 184 lb 3.2 oz (83.553 kg). GENERAL: Patient is a well appearing female in no acute distress HEENT:  Sclerae anicteric.  Oropharynx clear and moist. No ulcerations or evidence of oropharyngeal candidiasis. Neck is supple.  NODES:  No cervical, supraclavicular, or axillary lymphadenopathy palpated.  BREAST EXAM:  Left breast well healed surgical scar, no nodularity, no nodules, no masses, no skin changes or lesions, right breast with no nodules, masses, skin or nipple changes, benign bilateral breast exam.  LUNGS:  Clear to auscultation bilaterally.  No wheezes or rhonchi. HEART:  Regular rate and rhythm. No murmur appreciated. ABDOMEN:  Soft, nontender.  Positive, normoactive bowel sounds. No organomegaly palpated. MSK:  No focal spinal tenderness to palpation. Full range of motion bilaterally in the upper extremities. EXTREMITIES:  No peripheral edema.   SKIN:  Clear with no obvious rashes or skin changes. No nail dyscrasia. NEURO:  Nonfocal. Well oriented.  Appropriate affect. ECOG PERFORMANCE STATUS: 1 - Symptomatic but completely ambulatory   Lab Results: Lab Results  Component Value Date   WBC 8.1 12/02/2013   HGB 12.3 12/02/2013   HCT 38.8 12/02/2013   MCV 83.7 12/02/2013   PLT 355 12/02/2013     Chemistry      Component Value Date/Time   NA 140 12/02/2013 1456   NA 144 04/28/2013 1523   K 3.5 12/02/2013 1456   K 4.5 04/28/2013 1523   CL 104 04/28/2013 1523   CL 106 05/06/2012 1430   CO2 24 12/02/2013 1456   CO2 28 04/28/2013 1523   BUN 15.8 12/02/2013 1456   BUN 14 04/28/2013 1523   CREATININE 0.8 12/02/2013 1456   CREATININE 0.76 04/28/2013 1523   CREATININE 0.71 10/02/2011 1555   CREATININE 0.8 07/25/2010   GLU 89 07/25/2010      Component Value Date/Time   CALCIUM 9.4 12/02/2013 1456   CALCIUM 9.4  04/28/2013 1523   ALKPHOS 85 12/02/2013 1456   ALKPHOS 56 10/02/2011 1555   AST 16 12/02/2013 1456   AST 16 10/02/2011 1555   ALT 16 12/02/2013 1456   ALT 15 04/28/2013 1523   BILITOT <0.20 12/02/2013 1456   BILITOT 0.2* 10/02/2011 1555       Assessment: 12/02/2011 63 y.o. female with  1. Stage IB ER/PR positive invasive ductal carcinoma of the right breast.  The patient is doing very well today.  She has underwent lumpectomy followed by radiation, and then anti-estrogen therapy with Tamoxifen for two years and has been on Arimidex since August, 2012.   Plan:    10-01-1989 is doing well today.  She has no sign of recurrence.  She is tolerating Arimidex well and will continue this.  I recommended healthy diet, breast exams, and exercise.  We discussed her recent bone density in 2014.  She has a very mildly decreased bone density with a T score of -1.1.  I recommended calcium, vitamin d and weight bearing exercises.  This will be repeated in 2016.    We reviewed her mammogram results which demonstrated an increased breast density that may obscure small masses.  We discussed ordering a screening breast MRI, which the patient would prefer, so I have ordered it.     The patient will return in 6 months for labs and evaluation.  She knows to contact us in the interim should she have any questions or concerns.  We can certainly see her sooner if needed.    I spent 25 minutes counseling the patient face to face.  The total time spent in the appointment was 30 minutes.  Ellen [name], [place] [phone] 12/05/2013 12:34 AM

## 2013-12-02 NOTE — Telephone Encounter (Signed)
, °

## 2013-12-02 NOTE — Patient Instructions (Signed)
You are doing well.  You have no sign of recurrence.  Continue taking Arimidex.  I recommend healthy diet, exercise and monthly breast exam.     Breast Self-Awareness Practicing breast self-awareness may pick up problems early, prevent significant medical complications, and possibly save your life. By practicing breast self-awareness, you can become familiar with how your breasts look and feel and if your breasts are changing. This allows you to notice changes early. It can also offer you some reassurance that your breast health is good. One way to learn what is normal for your breasts and whether your breasts are changing is to do a breast self-exam. If you find a lump or something that was not present in the past, it is best to contact your caregiver right away. Other findings that should be evaluated by your caregiver include nipple discharge, especially if it is bloody; skin changes or reddening; areas where the skin seems to be pulled in (retracted); or new lumps and bumps. Breast pain is seldom associated with cancer (malignancy), but should also be evaluated by a caregiver. HOW TO PERFORM A BREAST SELF-EXAM The best time to examine your breasts is 5-7 days after your menstrual period is over. During menstruation, the breasts are lumpier, and it may be more difficult to pick up changes. If you do not menstruate, have reached menopause, or had your uterus removed (hysterectomy), you should examine your breasts at regular intervals, such as monthly. If you are breastfeeding, examine your breasts after a feeding or after using a breast pump. Breast implants do not decrease the risk for lumps or tumors, so continue to perform breast self-exams as recommended. Talk to your caregiver about how to determine the difference between the implant and breast tissue. Also, talk about the amount of pressure you should use during the exam. Over time, you will become more familiar with the variations of your breasts and  more comfortable with the exam. A breast self-exam requires you to remove all your clothes above the waist. 1. Look at your breasts and nipples. Stand in front of a mirror in a room with good lighting. With your hands on your hips, push your hands firmly downward. Look for a difference in shape, contour, and size from one breast to the other (asymmetry). Asymmetry includes puckers, dips, or bumps. Also, look for skin changes, such as reddened or scaly areas on the breasts. Look for nipple changes, such as discharge, dimpling, repositioning, or redness. 2. Carefully feel your breasts. This is best done either in the shower or tub while using soapy water or when flat on your back. Place the arm (on the side of the breast you are examining) above your head. Use the pads (not the fingertips) of your three middle fingers on your opposite hand to feel your breasts. Start in the underarm area and use  inch (2 cm) overlapping circles to feel your breast. Use 3 different levels of pressure (light, medium, and firm pressure) at each circle before moving to the next circle. The light pressure is needed to feel the tissue closest to the skin. The medium pressure will help to feel breast tissue a little deeper, while the firm pressure is needed to feel the tissue close to the ribs. Continue the overlapping circles, moving downward over the breast until you feel your ribs below your breast. Then, move one finger-width towards the center of the body. Continue to use the  inch (2 cm) overlapping circles to feel your breast as  you move slowly up toward the collar bone (clavicle) near the base of the neck. Continue the up and down exam using all 3 pressures until you reach the middle of the chest. Do this with each breast, carefully feeling for lumps or changes. 3.  Keep a written record with breast changes or normal findings for each breast. By writing this information down, you do not need to depend only on memory for size,  tenderness, or location. Write down where you are in your menstrual cycle, if you are still menstruating. Breast tissue can have some lumps or thick tissue. However, see your caregiver if you find anything that concerns you.  SEEK MEDICAL CARE IF:  You see a change in shape, contour, or size of your breasts or nipples.   You see skin changes, such as reddened or scaly areas on the breasts or nipples.   You have an unusual discharge from your nipples.   You feel a new lump or unusually thick areas.  Document Released: 02/10/2005 Document Revised: 01/28/2012 Document Reviewed: 05/28/2011 [facility] Patient Information 2015 [place], [place]. This information is not intended to replace advice given to you by your health care provider. Make sure you discuss any questions you have with your health care provider.

## 2013-12-03 ENCOUNTER — Telehealth: Payer: Self-pay

## 2013-12-03 NOTE — Telephone Encounter (Signed)
Patient called requesting to speak directly to Dr. Leward Quan. I informed patient she was not here today and that she is usually at the appointment center. I let patient know it would be a nurse to contact her back. Patient stated she really only wanted to speak with Dr. Leward Quan and it wasn't an emergency she could wait. Patient would not give any information on what she needed. Patients call back number is [phone]

## 2013-12-05 ENCOUNTER — Encounter: Payer: Self-pay | Admitting: Adult Health

## 2013-12-06 ENCOUNTER — Telehealth: Payer: Self-pay | Admitting: Hematology and Oncology

## 2013-12-06 NOTE — Telephone Encounter (Signed)
, °

## 2013-12-06 NOTE — Telephone Encounter (Signed)
I phoned pt to find out what her issue was that she did not want to discuss with staff. I left a message indicating that I would try to call back tomorrow.

## 2013-12-07 NOTE — Telephone Encounter (Signed)
Phoned pt for 2nd time to find out what her concern is; I left a message and requested that she give some detail or specify what concern or issue needs to be discussed.

## 2013-12-26 ENCOUNTER — Inpatient Hospital Stay
Admission: RE | Admit: 2013-12-26 | Discharge: 2013-12-26 | Disposition: A | Discharge status: Home / Self Care | Payer: Managed Care, Other (non HMO) | Source: Ambulatory Visit | Attending: Adult Health | Admitting: Adult Health

## 2014-01-24 ENCOUNTER — Other Ambulatory Visit: Payer: Self-pay | Admitting: Oncology

## 2014-01-24 DIAGNOSIS — Z853 Personal history of malignant neoplasm of breast: Secondary | ICD-10-CM

## 2014-01-28 ENCOUNTER — Other Ambulatory Visit: Payer: Self-pay | Admitting: Oncology

## 2014-02-10 ENCOUNTER — Other Ambulatory Visit: Payer: Self-pay | Admitting: Oncology

## 2014-02-10 DIAGNOSIS — Z853 Personal history of malignant neoplasm of breast: Secondary | ICD-10-CM

## 2014-03-21 ENCOUNTER — Telehealth: Payer: Self-pay | Admitting: Hematology and Oncology

## 2014-03-21 NOTE — Telephone Encounter (Signed)
due to call day 4/7 appt moved to 4/6. left message for pt and mailed schedule.

## 2014-05-11 ENCOUNTER — Encounter: Payer: Managed Care, Other (non HMO) | Admitting: Family Medicine

## 2014-05-15 LAB — HM DIABETES EYE EXAM

## 2014-05-21 ENCOUNTER — Other Ambulatory Visit: Payer: Self-pay | Admitting: Family Medicine

## 2014-05-29 ENCOUNTER — Telehealth: Payer: Self-pay | Admitting: Hematology and Oncology

## 2014-05-29 NOTE — Telephone Encounter (Signed)
Pt called to r/s forgot her apt, pt confirmed labs/ov .Marland Kitchen... KJ

## 2014-05-30 ENCOUNTER — Encounter: Payer: Self-pay | Admitting: Family Medicine

## 2014-05-30 ENCOUNTER — Ambulatory Visit (INDEPENDENT_AMBULATORY_CARE_PROVIDER_SITE_OTHER): Payer: Managed Care, Other (non HMO) | Admitting: Family Medicine

## 2014-05-30 VITALS — BP 145/77 | HR 80 | Temp 98.4°F | Resp 16 | Ht 61.5 in | Wt 188.0 lb

## 2014-05-30 DIAGNOSIS — I10 Essential (primary) hypertension: Secondary | ICD-10-CM | POA: Diagnosis not present

## 2014-05-30 DIAGNOSIS — Z0000 Encounter for general adult medical examination without abnormal findings: Secondary | ICD-10-CM | POA: Diagnosis not present

## 2014-05-30 DIAGNOSIS — E669 Obesity, unspecified: Secondary | ICD-10-CM | POA: Diagnosis not present

## 2014-05-30 LAB — POCT URINALYSIS DIPSTICK
Bilirubin, UA: NEGATIVE
Glucose, UA: NEGATIVE
Ketones, UA: NEGATIVE
Leukocytes, UA: NEGATIVE
NITRITE UA: NEGATIVE
PH UA: 7
Protein, UA: NEGATIVE
Spec Grav, UA: 1.015
UROBILINOGEN UA: 0.2

## 2014-05-30 MED ORDER — PRAVASTATIN SODIUM 40 MG PO TABS: 40.0000 mg | ORAL_TABLET | Freq: Every day | ORAL | Status: DC

## 2014-05-30 MED ORDER — AMLODIPINE BESYLATE 5 MG PO TABS: 5.0000 mg | ORAL_TABLET | Freq: Every day | ORAL | Status: DC

## 2014-05-30 NOTE — Progress Notes (Addendum)
Subjective:    Patient ID: Ellen [name], female    DOB: October 14, 1950, 64 y.o.   MRN: [id number]  HPI  This 64 y.o. Female is here for annual CPE; she is in treatment for breast cancer since 2013, currently managed by Dr. Humphrey Rolls at [facility]. HTN and lipid disorder are well controlled on current medications. She has a goiter which is not symptomatic.  HCM: MMG- As per [facility].           PAP- NA (s/p TAH).           IMM- Current; advised about Prevnar and Pneumovax.           CRS- 2015 (hx of polyps w/ 5-yr recall).           Vision- Annually.   Patient Active Problem List   Diagnosis Date Noted  . Hyperlipidemia 12/11/2003    Priority: Medium    Class: Chronic  . Goiter 04/28/2013  . History of tobacco use 11/05/2011  . History of breast cancer 11/04/2011  . Obesity (BMI 30-39.9) 04/03/2011  . HTN (hypertension)   . COLONIC POLYPS, ADENOMATOUS, HX OF 05/02/2008    Prior to Admission medications   Medication Sig Start Date End Date Taking? Authorizing Provider  amLODipine (NORVASC) 5 MG tablet Take 1 tablet (5 mg total) by mouth daily.   Yes Barton Fanny, MD  anastrozole (ARIMIDEX) 1 MG tablet TAKE 1 TABLET (1 MG TOTAL) BY MOUTH DAILY. 02/10/14  Yes Nicholas Lose, MD  aspirin EC 325 MG tablet Take 1 tablet (325 mg total) by mouth daily. 11/09/13  Yes Barton Fanny, MD  calcium-vitamin D (OSCAL WITH D) 500-200 MG-UNIT per tablet Take 1 tablet by mouth 2 (two) times daily.   Yes Historical Provider, MD  pravastatin (PRAVACHOL) 40 MG tablet Take 1 tablet (40 mg total) by mouth daily.   Yes Barton Fanny, MD    Past Surgical History  Procedure Laterality Date  . Abdominal hysterectomy  1986    Heavy bleeding  . Breast surgery  11/08/2007    Left partial mastectomy (lumpectomy)  . Breast biopsy  12/01/2007    Left axillary sentinal node bx : positive  . Colonoscopy  2010    History   Social History  . Marital Status: Divorced    Spouse  Name: N/A  . Number of Children: N/A  . Years of Education: N/A   Occupational History  . 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   Social History Main Topics  . Smoking status: Former Smoker    Types: Cigarettes    Quit date: 02/25/2007  . Smokeless tobacco: Never Used  . Alcohol Use: No  . Drug Use: No  . Sexual Activity: Not Currently   Other Topics Concern  . Not on file   Social History Narrative   Widowed. Education: [employer].    Family History  Problem Relation Age of Onset  . Cancer Father     Died at 74 of Liver CA  . Diabetes Father   . Colon cancer Neg Hx     Review of Systems  Constitutional: Negative.        Is concerned about weight. Acknowledges late night eating and lack of regular exercise.  HENT: Negative.   Eyes: Negative.   Respiratory: Negative.   Cardiovascular: Negative.   Gastrointestinal: Positive for constipation. Negative for nausea, abdominal pain, diarrhea, blood in stool and anal bleeding.  Endocrine: Negative.        Enlarged thyroid gland imaged in Feb 2012 w/ FNA in March 2012 >> Non-neoplastic Goiter. Pt has no symptoms and feels like mass has not grown.  Genitourinary: Negative.   Musculoskeletal: Negative.   Skin: Negative.   Allergic/Immunologic: Negative.   Neurological: Negative.   Hematological: Negative.   Psychiatric/Behavioral: Negative.        Objective:   Physical Exam  Constitutional: She is oriented to person, place, and time. Vital signs are normal. She appears well-developed and well-nourished. No distress.  Blood pressure 145/77, pulse 80, temperature 98.4 F (36.9 C), resp. rate 16, height 5' 1.5" (1.562 m), weight 188 lb (85.276 kg), SpO2 99 %.   HENT:  Head: Normocephalic and atraumatic.  Right Ear: Hearing, tympanic membrane, external ear and ear canal normal.  Left Ear: Hearing, tympanic membrane, external ear and ear canal normal.  Nose: Nose normal. No mucosal  edema, nasal deformity or septal deviation.  Mouth/Throat: Uvula is midline, oropharynx is clear and moist and mucous membranes are normal. No oral lesions. Normal dentition.  Eyes: Conjunctivae and lids are normal. Pupils are equal, round, and reactive to light. No scleral icterus.  Wears corrective lenses; s/p cataract removal x 1. Has earl cataract in other eye but no surgery needed at this time.  Neck: Trachea normal, normal range of motion, full passive range of motion without pain and phonation normal. Neck supple. No JVD present. No spinous process tenderness and no muscular tenderness present. Carotid bruit is not present. Thyroid mass and thyromegaly present.  Goiter w/ prominent L lobe nodule (pt reports no symptoms).  Cardiovascular: Normal rate, regular rhythm, S1 normal, S2 normal, normal heart sounds, intact distal pulses and normal pulses.   No extrasystoles are present. PMI is not displaced.  Exam reveals no gallop and no friction rub.   No murmur heard. Pulmonary/Chest: Effort normal and breath sounds normal. No respiratory distress. She has no decreased breath sounds. She has no wheezes.  Abdominal: Soft. Normal appearance. She exhibits no distension, no abdominal bruit, no pulsatile midline mass and no mass. Bowel sounds are decreased. There is no hepatosplenomegaly. There is no tenderness. There is no guarding and no CVA tenderness.  Genitourinary:  Deferred.  Musculoskeletal:       Cervical back: Normal.       Thoracic back: Normal.       Lumbar back: Normal.  Remainder of exam unremarkable.  Lymphadenopathy:       Head (right side): No submental, no submandibular, no tonsillar, no preauricular, no posterior auricular and no occipital adenopathy present.       Head (left side): No submental, no submandibular, no tonsillar, no preauricular, no posterior auricular and no occipital adenopathy present.    She has no cervical adenopathy.       Right: No inguinal and no  supraclavicular adenopathy present.       Left: No inguinal and no supraclavicular adenopathy present.  Neurological: She is alert and oriented to person, place, and time. She has normal strength and normal reflexes. She displays no atrophy and no tremor. No cranial nerve deficit or sensory deficit. She exhibits normal muscle tone. She displays a negative Romberg sign. Coordination and gait normal.  GET UP and GO test: time = 7 seconds.  Skin: Skin is warm, dry and intact. No ecchymosis, no lesion and no rash noted. She is not diaphoretic. No cyanosis or erythema. Nails show no clubbing.  Psychiatric: She has a  normal mood and affect. Her speech is normal. Thought content normal. Cognition and memory are normal.  Nursing note and vitals reviewed.   Results for orders placed or performed in visit on 05/30/14  POCT urinalysis dipstick  Result Value Ref Range   Color, UA Light Yellow    Clarity, UA Clear    Glucose, UA negative    Bilirubin, UA Negative    Ketones, UA Negative    Spec Grav, UA 1.015    Blood, UA Trace-Intact    pH, UA 7.0    Protein, UA Negative    Urobilinogen, UA 0.2    Nitrite, UA Negative    Leukocytes, UA Negative        Assessment & Plan:  Annual physical exam - Follow-up with Oncologist as scheduled. Encouraged pt to  Find resources online re: Better nutrition/ eating for weight loss. Avoid bedtime snacking. Plan: POCT urinalysis dipstick  Obesity (BMI 30-39.9)- Encouraged exercising in the home; get up and march in place during TV commercials or other activities to involve moving more.  Essential hypertension- Stable on current medications.  Meds ordered this encounter  Medications  . pravastatin (PRAVACHOL) 40 MG tablet    Sig: Take 1 tablet (40 mg total) by mouth daily.    Dispense:  30 tablet    Refill:  11  . amLODipine (NORVASC) 5 MG tablet    Sig: Take 1 tablet (5 mg total) by mouth daily.    Dispense:  30 tablet    Refill:  11    Pt given  information about ADVANCED DIRECTIVES.

## 2014-05-30 NOTE — Patient Instructions (Addendum)
Keeping You Healthy  Get These Tests  Blood Pressure- Have your blood pressure checked by your healthcare provider at least once a year.  Normal blood pressure is 120/80.  Weight- Have your body mass index (BMI) calculated to screen for obesity.  BMI is a measure of body fat based on height and weight.  You can calculate your own BMI at www.nhlbisupport.com/bmi/  Cholesterol- Have your cholesterol checked every year.  Diabetes- Have your blood sugar checked every year if you have high blood pressure, high cholesterol, a family history of diabetes or if you are overweight.  Pap Smear- Have a pap smear every 1 to 3 years if you have been sexually active.  If you are older than 65 and recent pap smears have been normal you may not need additional pap smears.  In addition, if you have had a hysterectomy  For benign disease additional pap smears are not necessary.  Mammogram-Yearly mammograms are essential for early detection of breast cancer  Screening for Colon Cancer- Colonoscopy starting at age 50. Screening may begin sooner depending on your family history and other health conditions.  Follow up colonoscopy as directed by your Gastroenterologist.  Screening for Osteoporosis- Screening begins at age 65 with bone density scanning, sooner if you are at higher risk for developing Osteoporosis.  Get these medicines  Calcium with Vitamin D- Your body requires 1200-1500 mg of Calcium a day and 800-1000 IU of Vitamin D a day.  You can only absorb 500 mg of Calcium at a time therefore Calcium must be taken in 2 or 3 separate doses throughout the day.  Hormones- Hormone therapy has been associated with increased risk for certain cancers and heart disease.  Talk to your healthcare provider about if you need relief from menopausal symptoms.  Aspirin- Ask your healthcare provider about taking Aspirin to prevent Heart Disease and Stroke.  Get these Immuniztions  Flu shot- Every fall  Pneumonia  shot- Once after the age of 65; if you are younger ask your healthcare provider if you need a pneumonia shot.  Tetanus- Every ten years.  Zostavax- Once after the age of 60 to prevent shingles.  Take these steps  Don't smoke- Your healthcare provider can help you quit. For tips on how to quit, ask your healthcare provider or go to www.smokefree.gov or call 1-800 QUIT-NOW.  Be physically active- Exercise 5 days a week for a minimum of 30 minutes.  If you are not already physically active, start slow and gradually work up to 30 minutes of moderate physical activity.  Try walking, dancing, bike riding, swimming, etc.  Eat a healthy diet- Eat a variety of healthy foods such as fruits, vegetables, whole grains, low fat milk, low fat cheeses, yogurt, lean meats, chicken, fish, eggs, dried beans, tofu, etc.  For more information go to www.thenutritionsource.org  Dental visit- Brush and floss teeth twice daily; visit your dentist twice a year.  Eye exam- Visit your Optometrist or Ophthalmologist yearly.  Drink alcohol in moderation- Limit alcohol intake to one drink or less a day.  Never drink and drive.  Depression- Your emotional health is as important as your physical health.  If you're feeling down or losing interest in things you normally enjoy, please talk to your healthcare provider.  Seat Belts- can save your life; always wear one  Smoke/Carbon Monoxide detectors- These detectors need to be installed on the appropriate level of your home.  Replace batteries at least once a year.  Violence- If anyone   is threatening or hurting you, please tell your healthcare provider.  Living Will/ Health care power of attorney- Discuss with your healthcare provider and family.    Goiter Goiter is an enlarged thyroid gland. The thyroid gland sits at the base of the front of the neck. The gland produces hormones that regulate mood, body temperature, pulse rate, and digestion. Most goiters are painless  and are not a cause for serious concern. Goiters and conditions that cause goiters can be treated if necessary.  CAUSES  Common causes of goiter include:  Graves disease (causes too much hormone to be produced [hyperthyroidism]).  Hashimoto disease (causes too little hormone to be produced [hypothyroidism]).  Thyroiditis (inflammation of the thyroid sometimes caused by virus or pregnancy).  Nodular goiter (small bumps form; sometimes called toxic nodular goiter).  Pregnancy.  Thyroid cancer (very few goiters with nodules are cancerous).  Certain medications.  Radiation exposure.  Iodine deficiency (more common in developing countries in inland populations). RISK FACTORS Risk factors for goiter include:  A family history of goiter.  Female gender.  Inadequate iodine in the diet.  Age older than 5 years. SYMPTOMS  Many goiters do not cause symptoms. When symptoms do occur, they may include:  Swelling in the lower part of the neck. This swelling can range from a very small bump to a large lump.  A tight feeling in the throat.  A hoarse voice. Less commonly, a goiter may result in:  Coughing.  Wheezing.  Difficulty swallowing.  Difficulty breathing.  Bulging neck veins.  Dizziness. When a goiter is the result of hyperthyroidism, symptoms may include:  Rapid or irregular heartbeat.  Sickness in your stomach (nausea).  Vomiting.  Diarrhea.  Shaking.  Irritable feeling.  Bulging eyes.  Weight loss.  Heat sensitivity.  Anxiety. When a goiter is the result of hypothyroidism, symptoms may include:  Tiredness.  Dry skin.  Constipation.  Weight gain.  Irregular menstrual cycle.  Depressed mood.  Sensitivity to cold. DIAGNOSIS  Tests used to diagnose goiter include:  A physical exam.  Blood tests, including thyroid hormone levels and antibody testing.  Ultrasonography, computerized X-ray scan (computed tomography, CT) or computerized  magnetic scan (magnetic resonance imaging, MRI).  Thyroid scan (imaging along with safe radioactive injection).  Tissue sample taken (biopsy) of nodules. This is sometimes done to confirm that the nodules are not cancerous. TREATMENT  Treatment will depend on the cause of the goiter. Treatment may include:  Monitoring. In some cases, no treatment is necessary, and your doctor will monitor your condition at regular checkups.  Medications and supplements. Thyroid medication (thyroid hormone replacement) is available for hyperthyroidism and hypothyroidism.  If inflammation is the cause, over-the-counter medication or steroid medication may be recommended.  Goiters caused by iodine deficiency can be treated with iodine supplements or changes in diet.  Radioactive iodine treatment. Radioactive iodine is injected into the blood. It travels to the thyroid gland, kills thyroid cells, and reduces the size of the gland. This is only used when the thyroid gland is overactive. Lifelong thyroid hormone medication is often necessary after this treatment.  Surgery. A procedure to remove all or part of the gland may be recommended in severe cases or when cancer is the cause. Hormones can be taken to replace the hormones normally produced by the thyroid. HOME CARE INSTRUCTIONS   Take medications as directed.  Follow your caregiver's recommendations for any dietary changes.  Follow up with your caregiver for further examination and testing, as directed.  PREVENTION   If you have a family history of goiter, discuss screening with your doctor.  Make sure you are getting enough iodine in your diet.  Use of iodized table salt can help prevent iodine deficiency. Document Released: 07/31/2009 Document Revised: 06/27/2013 Document Reviewed: 07/31/2009 [facility] Patient Information 2015 [place], [place]. This information is not intended to replace advice given to you by your health care provider. Make sure you  discuss any questions you have with your health care provider.    I have refilled your medications for 12 months. When you have lab work done at your next visit with Oncology, please ask them to draw lipids also. Last year's results were great!     Exercise to Lose Weight Exercise and a healthy diet may help you lose weight. Your doctor may suggest specific exercises. EXERCISE IDEAS AND TIPS  Choose low-cost things you enjoy doing, such as walking, bicycling, or exercising to workout videos.  Take stairs instead of the elevator.  Walk during your lunch break.  Park your car further away from work or school.  Go to a gym or an exercise class.  Start with 5 to 10 minutes of exercise each day. Build up to 30 minutes of exercise 4 to 6 days a week.  Wear shoes with good support and comfortable clothes.  Stretch before and after working out.  Work out until you breathe harder and your heart beats faster.  Drink extra water when you exercise.  Do not do so much that you hurt yourself, feel dizzy, or get very short of breath. Exercises that burn about 150 calories:  Running 1  miles in 15 minutes.  Playing volleyball for 45 to 60 minutes.  Washing and waxing a car for 45 to 60 minutes.  Playing touch football for 45 minutes.  Walking 1  miles in 35 minutes.  Pushing a stroller 1  miles in 30 minutes.  Playing basketball for 30 minutes.  Raking leaves for 30 minutes.  Bicycling 5 miles in 30 minutes.  Walking 2 miles in 30 minutes.  Dancing for 30 minutes.  Shoveling snow for 15 minutes.  Swimming laps for 20 minutes.  Walking up stairs for 15 minutes.  Bicycling 4 miles in 15 minutes.  Gardening for 30 to 45 minutes.  Jumping rope for 15 minutes.  Washing windows or floors for 45 to 60 minutes. Document Released: 03/15/2010 Document Revised: 05/05/2011 Document Reviewed: 03/15/2010 [facility] Patient Information 2015 [place], [place]. This information  is not intended to replace advice given to you by your health care provider. Make sure you discuss any questions you have with your health care provider.

## 2014-05-31 ENCOUNTER — Ambulatory Visit: Payer: Managed Care, Other (non HMO) | Admitting: Hematology and Oncology

## 2014-05-31 ENCOUNTER — Other Ambulatory Visit: Payer: Managed Care, Other (non HMO)

## 2014-06-01 ENCOUNTER — Encounter: Payer: Self-pay | Admitting: *Deleted

## 2014-06-01 ENCOUNTER — Ambulatory Visit: Payer: Managed Care, Other (non HMO) | Admitting: Hematology and Oncology

## 2014-06-01 ENCOUNTER — Other Ambulatory Visit: Payer: Managed Care, Other (non HMO)

## 2014-06-01 DIAGNOSIS — H579 Unspecified disorder of eye and adnexa: Secondary | ICD-10-CM | POA: Insufficient documentation

## 2014-06-01 DIAGNOSIS — H04129 Dry eye syndrome of unspecified lacrimal gland: Secondary | ICD-10-CM | POA: Insufficient documentation

## 2014-06-01 DIAGNOSIS — H04123 Dry eye syndrome of bilateral lacrimal glands: Secondary | ICD-10-CM

## 2014-06-15 ENCOUNTER — Other Ambulatory Visit: Payer: Self-pay

## 2014-06-15 DIAGNOSIS — Z853 Personal history of malignant neoplasm of breast: Secondary | ICD-10-CM

## 2014-06-16 ENCOUNTER — Telehealth: Payer: Self-pay | Admitting: Hematology and Oncology

## 2014-06-16 ENCOUNTER — Other Ambulatory Visit (HOSPITAL_BASED_OUTPATIENT_CLINIC_OR_DEPARTMENT_OTHER): Payer: Managed Care, Other (non HMO)

## 2014-06-16 ENCOUNTER — Ambulatory Visit (HOSPITAL_BASED_OUTPATIENT_CLINIC_OR_DEPARTMENT_OTHER): Payer: Managed Care, Other (non HMO) | Admitting: Hematology and Oncology

## 2014-06-16 VITALS — BP 147/79 | HR 64 | Temp 98.0°F | Resp 18 | Ht 61.5 in | Wt 190.1 lb

## 2014-06-16 DIAGNOSIS — C50812 Malignant neoplasm of overlapping sites of left female breast: Secondary | ICD-10-CM

## 2014-06-16 DIAGNOSIS — Z853 Personal history of malignant neoplasm of breast: Secondary | ICD-10-CM

## 2014-06-16 DIAGNOSIS — C50912 Malignant neoplasm of unspecified site of left female breast: Secondary | ICD-10-CM

## 2014-06-16 DIAGNOSIS — Z170 Estrogen receptor positive status [ER+]: Secondary | ICD-10-CM

## 2014-06-16 DIAGNOSIS — M8580 Other specified disorders of bone density and structure, unspecified site: Secondary | ICD-10-CM

## 2014-06-16 LAB — COMPREHENSIVE METABOLIC PANEL (CC13)
ALT: 15 U/L (ref 0–55)
ANION GAP: 13 meq/L — AB (ref 3–11)
AST: 14 U/L (ref 5–34)
Albumin: 3.6 g/dL (ref 3.5–5.0)
Alkaline Phosphatase: 75 U/L (ref 40–150)
BILIRUBIN TOTAL: 0.23 mg/dL (ref 0.20–1.20)
BUN: 12.9 mg/dL (ref 7.0–26.0)
CALCIUM: 8.9 mg/dL (ref 8.4–10.4)
CHLORIDE: 108 meq/L (ref 98–109)
CO2: 22 mEq/L (ref 22–29)
CREATININE: 0.8 mg/dL (ref 0.6–1.1)
GLUCOSE: 110 mg/dL (ref 70–140)
Potassium: 3.8 mEq/L (ref 3.5–5.1)
Sodium: 143 mEq/L (ref 136–145)
Total Protein: 7.1 g/dL (ref 6.4–8.3)

## 2014-06-16 LAB — CBC WITH DIFFERENTIAL/PLATELET
BASO%: 0.7 % (ref 0.0–2.0)
BASOS ABS: 0 10*3/uL (ref 0.0–0.1)
EOS ABS: 0.1 10*3/uL (ref 0.0–0.5)
EOS%: 2.3 % (ref 0.0–7.0)
HEMATOCRIT: 37.7 % (ref 34.8–46.6)
HGB: 12.1 g/dL (ref 11.6–15.9)
LYMPH#: 2.1 10*3/uL (ref 0.9–3.3)
LYMPH%: 35.8 % (ref 14.0–49.7)
MCH: 26.7 pg (ref 25.1–34.0)
MCHC: 32.2 g/dL (ref 31.5–36.0)
MCV: 83.1 fL (ref 79.5–101.0)
MONO#: 0.5 10*3/uL (ref 0.1–0.9)
MONO%: 8.6 % (ref 0.0–14.0)
NEUT%: 52.6 % (ref 38.4–76.8)
NEUTROS ABS: 3.1 10*3/uL (ref 1.5–6.5)
Platelets: 340 10*3/uL (ref 145–400)
RBC: 4.54 10*6/uL (ref 3.70–5.45)
RDW: 14.5 % (ref 11.2–14.5)
WBC: 5.9 10*3/uL (ref 3.9–10.3)

## 2014-06-16 NOTE — Addendum Note (Signed)
Addended by: Prentiss Bells on: 06/16/2014 01:16 PM   Modules accepted: Medications

## 2014-06-16 NOTE — Progress Notes (Signed)
Patient Care Team: Barton Fanny, MD as PCP - General (Family Medicine) Calton Dach, MD as Referring Physician (Optometry) Consuela Mimes, MD as Consulting Physician (Oncology)  DIAGNOSIS: No matching staging information was found for the patient.  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 She is on anastrozole and tolerating it extremely well without any major problems or concerns. She denies any hot flashes or myalgias. She denies any lumps or nodules in the breasts.  REVIEW OF SYSTEMS:   Constitutional: Denies fevers, chills or abnormal weight loss Eyes: Denies blurriness of vision Ears, nose, mouth, throat, and face: Denies mucositis or sore throat Respiratory: Denies cough, dyspnea or wheezes Cardiovascular: Denies palpitation, chest discomfort or lower extremity swelling Gastrointestinal:  Denies nausea, heartburn or change in bowel habits Skin: Denies abnormal skin rashes Lymphatics: Denies new lymphadenopathy or easy bruising Neurological:Denies numbness, tingling or new weaknesses Behavioral/Psych: Mood is stable, no new changes  Breast:   denies any pain or lumps or nodules in either breasts All other systems were reviewed with the patient and are negative.  I have reviewed the past medical history, past surgical history, social history and family history with the patient and they are unchanged from previous note.  ALLERGIES:  is allergic to ace inhibitors and simvastatin.  MEDICATIONS:  Current Outpatient Prescriptions  Medication Sig Dispense Refill  . amLODipine (NORVASC) 5 MG tablet Take 1 tablet (5 mg total) by mouth daily. 30 tablet 11  . anastrozole (ARIMIDEX) 1 MG tablet TAKE 1 TABLET (1 MG TOTAL) BY MOUTH DAILY. 90 tablet 1  . aspirin EC 325 MG tablet Take 1 tablet (325 mg total) by mouth daily. 90 tablet 1  . calcium-vitamin D (OSCAL WITH D) 500-200 MG-UNIT per tablet Take 1 tablet by mouth 2 (two) times daily.    . pravastatin (PRAVACHOL) 40 MG tablet Take 1 tablet (40 mg total) by mouth daily. 30 tablet 11   No current facility-administered medications for this visit.    PHYSICAL EXAMINATION: ECOG PERFORMANCE STATUS: 0 - Asymptomatic  Filed Vitals:   06/16/14 1239  BP: 147/79  Pulse: 64  Temp: 98 F (36.7 C)  Resp: 18   Filed Weights   06/16/14 1239  Weight: 190 lb 1.6 oz (86.229 kg)    GENERAL:alert, no distress and comfortable SKIN: skin color, texture, turgor are normal, no rashes or significant lesions EYES: normal, Conjunctiva are pink and non-injected, sclera clear OROPHARYNX:no exudate, no erythema and lips, buccal mucosa, and tongue normal  NECK: supple, thyroid normal size, non-tender, without nodularity LYMPH:  no palpable lymphadenopathy in the cervical, axillary or inguinal LUNGS: clear to auscultation and percussion with normal breathing effort HEART: regular rate & rhythm and no murmurs and no lower extremity  edema ABDOMEN:abdomen soft, non-tender and normal bowel sounds Musculoskeletal:no cyanosis of digits and no clubbing  NEURO: alert & oriented x 3 with fluent speech, no focal  motor/sensory deficits BREAST: No palpable masses or nodules in either right or left breasts. No palpable axillary supraclavicular or infraclavicular adenopathy no breast tenderness or nipple discharge. (exam performed in the presence of a chaperone)  LABORATORY DATA:  I have reviewed the data as listed   Chemistry      Component Value Date/Time   NA 143 06/16/2014 1201   NA 144 04/28/2013 1523   K 3.8 06/16/2014 1201   K 4.5 04/28/2013 1523   CL 104 04/28/2013 1523   CL 106 05/06/2012 1430   CO2 22 06/16/2014 1201   CO2 28 04/28/2013 1523   BUN 12.9 06/16/2014 1201   BUN 14 04/28/2013 1523   CREATININE 0.8 06/16/2014 1201   CREATININE 0.76 04/28/2013 1523   CREATININE 0.71 10/02/2011 1555   CREATININE 0.8 07/25/2010   GLU 89 07/25/2010      Component Value Date/Time   CALCIUM 8.9 06/16/2014 1201   CALCIUM 9.4 04/28/2013 1523   ALKPHOS 75 06/16/2014 1201   ALKPHOS 56 10/02/2011 1555   AST 14 06/16/2014 1201   AST 16 10/02/2011 1555   ALT 15 06/16/2014 1201   ALT 15 04/28/2013 1523   BILITOT 0.23 06/16/2014 1201   BILITOT 0.2* 10/02/2011 1555       Lab Results  Component Value Date   WBC 5.9 06/16/2014   HGB 12.1 06/16/2014   HCT 37.7 06/16/2014   MCV 83.1 06/16/2014   PLT 340 06/16/2014   NEUTROABS 3.1 06/16/2014   RADIOGRAPHIC STUDIES: I have personally reviewed the radiology reports and agreed with their findings. 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  Anastrozole toxicities: Tolerating anastrozole extremely well 1. Bone density 06/20/2013 shows very  mild osteopenia T score -1.1 in the spine and normal hips: Continue with calcium and vitamin D After discussing the pros and cons of continuation of anastrozole beyond 5 years, we have decided to continue anastrozole for next 5 more years. Patient has had no symptoms or side effects and would like to stay on it. I instructed her that there is no data at this time to support longer term antiestrogen therapy with anastrozole. However given her preferences, I will continue it further.  Breast Cancer Surveillance: 1. Breast exam 06/16/14: Normal 2. Mammogram 09/28/2013 No abnormalities. Breast Density Category C. I recommended that she get 3-D mammograms annually for surveillance.  Discussed the differences between different breast density categories.  Return to clinic once a year for follow-ups   No orders of the defined types were placed in this encounter.   The patient has a good understanding of the overall plan. she agrees with it. She will call with any problems that may develop before her next visit here.   Rulon Eisenmenger, MD

## 2014-06-16 NOTE — Telephone Encounter (Signed)
appointments made and avs printed for patient °

## 2014-06-16 NOTE — Assessment & Plan Note (Signed)
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  Anastrozole toxicities: Tolerating anastrozole extremely well 1. Bone density 06/20/2013 shows very mild osteopenia T score -1.1 in the spine and normal hips: Continue with calcium and vitamin D  Breast Cancer Surveillance: 1. Breast exam 06/16/14: Normal 2. Mammogram 09/28/2013 No abnormalities. Breast Density Category C. I recommended that she get 3-D mammograms annually for surveillance. Discussed the differences between different breast density categories.  Return to clinic once a year for follow-ups

## 2014-07-26 ENCOUNTER — Encounter: Payer: Self-pay | Admitting: Gastroenterology

## 2014-09-03 ENCOUNTER — Other Ambulatory Visit: Payer: Self-pay | Admitting: Hematology and Oncology

## 2014-09-04 NOTE — Telephone Encounter (Signed)
Last ov 06/16/14.  Next ov 06/18/15.  Chart reviewed

## 2014-09-06 ENCOUNTER — Other Ambulatory Visit: Payer: Self-pay | Admitting: Family Medicine

## 2014-09-06 ENCOUNTER — Other Ambulatory Visit: Payer: Self-pay | Admitting: Adult Health

## 2014-09-06 DIAGNOSIS — Z9889 Other specified postprocedural states: Secondary | ICD-10-CM

## 2014-10-04 ENCOUNTER — Other Ambulatory Visit: Payer: Self-pay

## 2014-10-04 ENCOUNTER — Other Ambulatory Visit: Payer: Self-pay | Admitting: Family Medicine

## 2014-10-04 DIAGNOSIS — Z9889 Other specified postprocedural states: Secondary | ICD-10-CM

## 2014-10-05 ENCOUNTER — Other Ambulatory Visit: Payer: Self-pay | Admitting: Family Medicine

## 2014-10-05 DIAGNOSIS — Z9889 Other specified postprocedural states: Secondary | ICD-10-CM

## 2014-10-10 ENCOUNTER — Ambulatory Visit
Admission: RE | Admit: 2014-10-10 | Discharge: 2014-10-10 | Disposition: A | Discharge status: Home / Self Care | Payer: Managed Care, Other (non HMO) | Source: Ambulatory Visit | Attending: Family Medicine | Admitting: Family Medicine

## 2014-10-10 DIAGNOSIS — Z9889 Other specified postprocedural states: Secondary | ICD-10-CM

## 2014-11-28 ENCOUNTER — Encounter: Payer: Self-pay | Admitting: Family Medicine

## 2014-11-28 ENCOUNTER — Ambulatory Visit (INDEPENDENT_AMBULATORY_CARE_PROVIDER_SITE_OTHER): Payer: Managed Care, Other (non HMO) | Admitting: Family Medicine

## 2014-11-28 VITALS — BP 148/88 | HR 81 | Temp 98.3°F | Resp 16 | Wt 191.2 lb

## 2014-11-28 DIAGNOSIS — R6884 Jaw pain: Secondary | ICD-10-CM

## 2014-11-28 DIAGNOSIS — E669 Obesity, unspecified: Secondary | ICD-10-CM | POA: Diagnosis not present

## 2014-11-28 DIAGNOSIS — I10 Essential (primary) hypertension: Secondary | ICD-10-CM | POA: Diagnosis not present

## 2014-11-28 DIAGNOSIS — E785 Hyperlipidemia, unspecified: Secondary | ICD-10-CM

## 2014-11-28 LAB — COMPREHENSIVE METABOLIC PANEL
ALK PHOS: 71 U/L (ref 33–130)
ALT: 13 U/L (ref 6–29)
AST: 15 U/L (ref 10–35)
Albumin: 4.1 g/dL (ref 3.6–5.1)
BUN: 17 mg/dL (ref 7–25)
CO2: 27 mmol/L (ref 20–31)
Calcium: 9.3 mg/dL (ref 8.6–10.4)
Chloride: 102 mmol/L (ref 98–110)
Creat: 0.78 mg/dL (ref 0.50–0.99)
Glucose, Bld: 86 mg/dL (ref 65–99)
Potassium: 3.9 mmol/L (ref 3.5–5.3)
SODIUM: 140 mmol/L (ref 135–146)
Total Bilirubin: 0.4 mg/dL (ref 0.2–1.2)
Total Protein: 7.4 g/dL (ref 6.1–8.1)

## 2014-11-28 LAB — CBC
HCT: 40.6 % (ref 36.0–46.0)
Hemoglobin: 13 g/dL (ref 12.0–15.0)
MCH: 26.9 pg (ref 26.0–34.0)
MCHC: 32 g/dL (ref 30.0–36.0)
MCV: 84.1 fL (ref 78.0–100.0)
MPV: 9.4 fL (ref 8.6–12.4)
Platelets: 373 10*3/uL (ref 150–400)
RBC: 4.83 MIL/uL (ref 3.87–5.11)
RDW: 14.2 % (ref 11.5–15.5)
WBC: 6.7 10*3/uL (ref 4.0–10.5)

## 2014-11-28 LAB — LIPID PANEL
CHOL/HDL RATIO: 4.5 ratio (ref <=5.0)
CHOLESTEROL: 171 mg/dL (ref 125–200)
HDL: 38 mg/dL — AB (ref >=46)
LDL Cholesterol: 108 mg/dL (ref <130)
TRIGLYCERIDES: 124 mg/dL (ref <150)
VLDL: 25 mg/dL (ref <30)

## 2014-11-28 LAB — TSH: TSH: 1.214 u[IU]/mL (ref 0.350–4.500)

## 2014-11-28 MED ORDER — IBUPROFEN 800 MG PO TABS: 800.0000 mg | ORAL_TABLET | Freq: Two times a day (BID) | ORAL | Status: DC | PRN

## 2014-11-28 NOTE — Patient Instructions (Signed)
Work on cutting back soda intake- try to substitute coffee or tea or water Plan healthy meals on the weekends- get your daughter and granddaughters to help with planning.

## 2014-11-28 NOTE — Progress Notes (Signed)
Subjective:    Patient ID: Ellen [name], female    DOB: 1950/11/03, 64 y.o.   MRN: [id number]  HPI This is a pleasant 64 yo female who presents today for follow up of HTN, hyperlipidemia. 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 Her daughter and two granddaughters live with her. She has not had her amlodipine yet today, but usually takes in the mornings. Rarely misses doses. No chest pain, no SOB, occasional edema in legs. 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   She noticed some pain and swelling along her right jaw x 3-4 days, some pain with eating. No fever, no runny nose, no sore throat. Took motrin 800 mg x 1 with good relief. Swelling better today. No tooth or gum pain, no bad taste in mouth, no fever.   Past Medical History  Diagnosis Date  . HTN (hypertension)   . Breast CA ([place]) 10/2007    S/P Surgery and Radiation Tx completed 03/20/08  . Thyroid goiter 03/2010    FNA of Left Thyroid Nodule - 05/14/2010  . Hyperlipidemia    Past Surgical History  Procedure Laterality Date  . Abdominal hysterectomy  1986    Heavy bleeding  . Breast surgery  11/08/2007    Left partial mastectomy (lumpectomy)  . Breast biopsy  12/01/2007    Left axillary sentinal node bx : positive  . Colonoscopy  2010   Family History  Problem Relation Age of Onset  . Cancer Father     Died at 66 of Liver CA  . Diabetes Father   . Colon cancer Neg Hx    Social History  Substance Use Topics  . Smoking status: Former Smoker    Types: Cigarettes    Quit date: 02/25/2007  . Smokeless tobacco: Never Used  . Alcohol Use: No  Medications, allergies, past medical history, surgical history, family history, social history and problem list reviewed and updated.  Review of Systems Per HPI    Objective:   Physical Exam Physical Exam  Constitutional: Oriented to person, place, and time. She appears well-developed and well-nourished.  HENT:  Head:  Normocephalic and atraumatic.  Eyes: Conjunctivae are normal.  Neck: Normal range of motion. Neck supple. Goiter (chronic per previous notes). There is minimal swelling along right jaw line, no erythema, slightly tender to palpation of jaw, no TMJ tenderness, jaw with full ROM. No pain with palpation of buccal mucosa. Parotid gland opening without drainage. Dentition unremarkable.  Cardiovascular: Normal rate, regular rhythm and normal heart sounds.   Pulmonary/Chest: Effort normal and breath sounds normal.  Musculoskeletal: Normal range of motion.  Neurological: Alert and oriented to person, place, and time.  Skin: Skin is warm and dry.  Psychiatric: Normal mood and affect. Behavior is normal. Judgment and thought content normal.  Vitals reviewed. BP 152/76 mmHg  Pulse 81  Temp(Src) 98.3 F (36.8 C) (Oral)  Resp 16  Wt 191 lb 3.2 oz (86.728 kg) Wt Readings from Last 3 Encounters:  11/28/14 191 lb 3.2 oz (86.728 kg)  06/16/14 190 lb 1.6 oz (86.229 kg)  05/30/14 188 lb (85.276 kg)  Blood pressure recheck 148/88    Assessment & Plan:  1. Jaw pain - advised her to RTC or go to ER if pain worsens, if she develops fever or if not improved in 2-3 days - ibuprofen (ADVIL,MOTRIN) 800 MG tablet; Take 1 tablet (800 mg total) by mouth 2 (two)  times daily as needed.  Dispense: 20 tablet; Refill: 0 - She was advised to take as little NSAID as possible for pain relief  2. Obesity - patient very active working two jobs and has little time or energy for meal prep- discussed importance of avoiding soda, juice, sweet tea, skipping meals and importance of portion control - encouraged 1/2 pound weight loss per week - TSH - Hemoglobin A1c  3. Essential hypertension - CBC - Comprehensive metabolic panel - Hemoglobin A1c  4. Hyperlipidemia - Comprehensive metabolic panel - Hemoglobin A1c - Lipid panel   Clarene Reamer, FNP-BC  [facility]  11/30/2014 8:26 PM

## 2014-11-29 LAB — HEMOGLOBIN A1C
Hgb A1c MFr Bld: 6.8 % — ABNORMAL HIGH (ref <5.7)
MEAN PLASMA GLUCOSE: 148 mg/dL — AB (ref <117)

## 2014-12-04 ENCOUNTER — Other Ambulatory Visit: Payer: Self-pay | Admitting: Family Medicine

## 2014-12-04 DIAGNOSIS — E1169 Type 2 diabetes mellitus with other specified complication: Secondary | ICD-10-CM

## 2014-12-04 DIAGNOSIS — E669 Obesity, unspecified: Principal | ICD-10-CM

## 2014-12-04 MED ORDER — METFORMIN HCL 1000 MG PO TABS: ORAL_TABLET | ORAL | Status: DC

## 2014-12-05 ENCOUNTER — Encounter: Payer: Self-pay | Admitting: Family Medicine

## 2014-12-12 ENCOUNTER — Telehealth: Payer: Self-pay | Admitting: Family Medicine

## 2014-12-12 NOTE — Telephone Encounter (Signed)
lmom of pt new appt time on 05/29/15 at 3:30

## 2014-12-16 ENCOUNTER — Telehealth: Payer: Self-pay | Admitting: Family Medicine

## 2014-12-16 NOTE — Telephone Encounter (Addendum)
Patient has a question for Tor Netters, FNP. She wouldn't tell me what it is.  [phone]

## 2014-12-18 NOTE — Telephone Encounter (Signed)
Patient wanted to discuss her aspirin dose. She had been taking 325 mg per Dr. Leward Quan. I told her it was fine to stay on that dose. She has lost 5 pounds. Her jaw pain resolved the day after she was seen- she felt a pop and had a bad taste in her mouth then the pain and swelling resolved. Discussed possibility of parotid gland blockage.  She will follow up as scheduled.

## 2014-12-18 NOTE — Telephone Encounter (Signed)
Attempted to call patient. No answer. Voice mail not set up, I will try her later.

## 2015-03-13 ENCOUNTER — Ambulatory Visit: Payer: Managed Care, Other (non HMO) | Admitting: Family Medicine

## 2015-03-27 ENCOUNTER — Ambulatory Visit (INDEPENDENT_AMBULATORY_CARE_PROVIDER_SITE_OTHER): Payer: Managed Care, Other (non HMO) | Admitting: Family Medicine

## 2015-03-27 ENCOUNTER — Encounter: Payer: Self-pay | Admitting: Family Medicine

## 2015-03-27 VITALS — BP 174/84 | HR 72 | Temp 98.3°F | Resp 16 | Ht 62.0 in | Wt 174.0 lb

## 2015-03-27 DIAGNOSIS — I10 Essential (primary) hypertension: Secondary | ICD-10-CM | POA: Diagnosis not present

## 2015-03-27 DIAGNOSIS — R739 Hyperglycemia, unspecified: Secondary | ICD-10-CM

## 2015-03-27 DIAGNOSIS — R7309 Other abnormal glucose: Secondary | ICD-10-CM

## 2015-03-27 LAB — POCT GLYCOSYLATED HEMOGLOBIN (HGB A1C): Hemoglobin A1C: 6.2

## 2015-03-27 MED ORDER — AMLODIPINE BESYLATE 10 MG PO TABS: 10.0000 mg | ORAL_TABLET | Freq: Every day | ORAL | Status: DC

## 2015-03-27 NOTE — Progress Notes (Signed)
   Subjective:    Patient ID: Ellen [name], female    DOB: [birth year], 65 y.o.   MRN: [id number]  HPI This is a pleasant 65 yo female who presents today for follow up of DM, HTN. She has been walking daily and decreasing starches, soda and sweets. She feels more energetic. No medication side effects.   Past Medical History  Diagnosis Date  . HTN (hypertension)   . Breast CA ([place]) 10/2007    S/P Surgery and Radiation Tx completed 03/20/08  . Thyroid goiter 03/2010    FNA of Left Thyroid Nodule - 05/14/2010  . Hyperlipidemia    Past Surgical History  Procedure Laterality Date  . Abdominal hysterectomy  1986    Heavy bleeding  . Breast surgery  11/08/2007    Left partial mastectomy (lumpectomy)  . Breast biopsy  12/01/2007    Left axillary sentinal node bx : positive  . Colonoscopy  2010   Family History  Problem Relation Age of Onset  . Cancer Father     Died at 18 of Liver CA  . Diabetes Father   . Colon cancer Neg Hx    Social History  Substance Use Topics  . Smoking status: Former Smoker    Types: Cigarettes    Quit date: 02/25/2007  . Smokeless tobacco: Never Used  . Alcohol Use: No    Review of Systems No chest pain, no SOB, no edema, no abdominal pain, no nausea, no vomiting, no diarrhea.     Objective:   Physical Exam  Constitutional: She is oriented to person, place, and time. She appears well-developed and well-nourished.  HENT:  Head: Normocephalic and atraumatic.  Eyes: Conjunctivae are normal.  Neck:  Chronic, stable symmetric neck fullness.   Cardiovascular: Normal rate, regular rhythm and normal heart sounds.   Pulmonary/Chest: Effort normal.  Musculoskeletal: Normal range of motion. She exhibits no edema.  Neurological: She is alert and oriented to person, place, and time.  Skin: Skin is warm and dry.  Psychiatric: She has a normal mood and affect. Her behavior is normal. Judgment and thought content normal.  Vitals reviewed.     BP 174/84  mmHg  Pulse 72  Temp(Src) 98.3 F (36.8 C)  Resp 16  Ht 5\' 2"  (1.575 m)  Wt 174 lb (78.926 kg)  BMI 31.82 kg/m2 Wt Readings from Last 3 Encounters:  03/27/15 174 lb (78.926 kg)  11/28/14 191 lb 3.2 oz (86.728 kg)  06/16/14 190 lb 1.6 oz (86.229 kg)   Blood pressure recheck- 162/90    POCT HgbA1C- 6.2 today, down from 6.8 10/16 Assessment & Plan:  1. Elevated blood sugar - improved, encouraged continued exercise and weight loss - POCT glycosylated hemoglobin (Hb A1C) - Microalbumin, urine  2. Essential hypertension - will increase amlodipine from 5 mg to 10 mg - amLODipine (NORVASC) 10 MG tablet; Take 1 tablet (10 mg total) by mouth daily.  Dispense: 90 tablet; Refill: 0 - patient to have blood pressure recheck in 2-3 weeks - follow up in 3 months  Clarene Reamer, FNP-BC  [facility]  03/28/2015 8:53 PM

## 2015-03-27 NOTE — Patient Instructions (Signed)
Please take two of your amlodipine tablets until you run out, then get your new prescription for the 10 mg dose  Please come in to the clinic in 2-3 weeks for a blood pressure only check.

## 2015-03-28 LAB — MICROALBUMIN, URINE: Microalb, Ur: 0.5 mg/dL

## 2015-03-29 ENCOUNTER — Encounter: Payer: Self-pay | Admitting: Family Medicine

## 2015-04-26 ENCOUNTER — Other Ambulatory Visit: Payer: Self-pay | Admitting: Family Medicine

## 2015-05-29 ENCOUNTER — Ambulatory Visit: Payer: Self-pay | Admitting: Family Medicine

## 2015-06-05 ENCOUNTER — Ambulatory Visit (INDEPENDENT_AMBULATORY_CARE_PROVIDER_SITE_OTHER): Payer: Managed Care, Other (non HMO) | Admitting: Family Medicine

## 2015-06-05 ENCOUNTER — Encounter: Payer: Self-pay | Admitting: Family Medicine

## 2015-06-05 VITALS — BP 134/81 | HR 73 | Temp 98.7°F | Resp 16 | Ht 62.0 in | Wt 166.0 lb

## 2015-06-05 DIAGNOSIS — I10 Essential (primary) hypertension: Secondary | ICD-10-CM

## 2015-06-05 DIAGNOSIS — E669 Obesity, unspecified: Secondary | ICD-10-CM | POA: Diagnosis not present

## 2015-06-05 DIAGNOSIS — R7309 Other abnormal glucose: Secondary | ICD-10-CM

## 2015-06-05 DIAGNOSIS — R739 Hyperglycemia, unspecified: Secondary | ICD-10-CM

## 2015-06-05 DIAGNOSIS — E785 Hyperlipidemia, unspecified: Secondary | ICD-10-CM

## 2015-06-05 LAB — LIPID PANEL
CHOLESTEROL: 156 mg/dL (ref 125–200)
HDL: 48 mg/dL (ref >=46)
LDL Cholesterol: 94 mg/dL (ref <130)
TRIGLYCERIDES: 71 mg/dL (ref <150)
Total CHOL/HDL Ratio: 3.3 Ratio (ref <=5.0)
VLDL: 14 mg/dL (ref <30)

## 2015-06-05 LAB — BASIC METABOLIC PANEL
BUN: 12 mg/dL (ref 7–25)
CHLORIDE: 103 mmol/L (ref 98–110)
CO2: 26 mmol/L (ref 20–31)
Calcium: 9.3 mg/dL (ref 8.6–10.4)
Creat: 0.81 mg/dL (ref 0.50–0.99)
Glucose, Bld: 98 mg/dL (ref 65–99)
POTASSIUM: 4.4 mmol/L (ref 3.5–5.3)
SODIUM: 141 mmol/L (ref 135–146)

## 2015-06-05 LAB — HEMOGLOBIN A1C
HEMOGLOBIN A1C: 6.1 % — AB (ref <5.7)
MEAN PLASMA GLUCOSE: 128 mg/dL

## 2015-06-05 MED ORDER — AMLODIPINE BESYLATE 10 MG PO TABS: 10.0000 mg | ORAL_TABLET | Freq: Every day | ORAL | Status: DC

## 2015-06-05 MED ORDER — METFORMIN HCL 1000 MG PO TABS: 500.0000 mg | ORAL_TABLET | Freq: Two times a day (BID) | ORAL | Status: DC

## 2015-06-05 MED ORDER — PRAVASTATIN SODIUM 40 MG PO TABS: 40.0000 mg | ORAL_TABLET | Freq: Every day | ORAL | Status: DC

## 2015-06-05 NOTE — Patient Instructions (Addendum)
  Please call the office in September to schedule your annual physical for October   IF you received an x-ray today, you will receive an invoice from [facility]. Please contact [facility] at [phone] with questions or concerns regarding your invoice.   IF you received labwork today, you will receive an invoice from [employer]. Please contact [facility] at [phone] with questions or concerns regarding your invoice.   Our billing staff will not be able to assist you with questions regarding bills from these companies.  You will be contacted with the lab results as soon as they are available. The fastest way to get your results is to activate your My Chart account. Instructions are located on the last page of this paperwork. If you have not heard from [place] regarding the results in 2 weeks, please contact this office.

## 2015-06-05 NOTE — Progress Notes (Signed)
   Subjective:    Patient ID: CASONDRA VANDERHYDE, female    DOB: 1951/02/04, 65 y.o.   MRN: [id number]  HPI This is a pleasant 65 yo female who presents today for follow up of HTN, elevated blood sugar. She has been doing well. She continues to watch her diet and is loosing weight. She is tolerating medications without side effects.   Past Medical History  Diagnosis Date  . HTN (hypertension)   . Breast CA ([place]) 10/2007    S/P Surgery and Radiation Tx completed 03/20/08  . Thyroid goiter 03/2010    FNA of Left Thyroid Nodule - 05/14/2010  . Hyperlipidemia    Past Medical History  Diagnosis Date  . HTN (hypertension)   . Breast CA ([place]) 10/2007    S/P Surgery and Radiation Tx completed 03/20/08  . Thyroid goiter 03/2010    FNA of Left Thyroid Nodule - 05/14/2010  . Hyperlipidemia    Social History  Substance Use Topics  . Smoking status: Former Smoker    Types: Cigarettes    Quit date: 02/25/2007  . Smokeless tobacco: Never Used  . Alcohol Use: No      Review of Systems No chest pain, no SOB, no edema    Objective:   Physical Exam Physical Exam  Constitutional: Oriented to person, place, and time. She appears well-developed and well-nourished.  HENT:  Head: Normocephalic and atraumatic.  Eyes: Conjunctivae are normal.  Neck: Normal range of motion. Neck supple.  Cardiovascular: Normal rate, regular rhythm and normal heart sounds.   Pulmonary/Chest: Effort normal and breath sounds normal.  Musculoskeletal: Normal range of motion.  Neurological: Alert and oriented to person, place, and time.  Skin: Skin is warm and dry.  Psychiatric: Normal mood and affect. Behavior is normal. Judgment and thought content normal.  Vitals reviewed.     BP 134/81 mmHg  Pulse 73  Temp(Src) 98.7 F (37.1 C)  Resp 16  Ht 5\' 2"  (1.575 m)  Wt 166 lb (75.297 kg)  BMI 30.35 kg/m2 Wt Readings from Last 3 Encounters:  06/05/15 166 lb (75.297 kg)  03/27/15 174 lb (78.926 kg)  11/28/14  191 lb 3.2 oz (86.728 kg)       Assessment & Plan:  1. Essential hypertension - Basic metabolic panel - amLODipine (NORVASC) 10 MG tablet; Take 1 tablet (10 mg total) by mouth daily.  Dispense: 90 tablet; Refill: 0  2. Obesity (BMI 0000000) - Basic metabolic panel - Hemoglobin A1c  3. Elevated blood sugar - Hemoglobin A1c - metFORMIN (GLUCOPHAGE) 1000 MG tablet; Take 0.5 tablets (500 mg total) by mouth 2 (two) times daily with a meal.  Dispense: 90 tablet; Refill: 1  4. Hyperlipidemia - pravastatin (PRAVACHOL) 40 MG tablet; Take 1 tablet (40 mg total) by mouth daily.  Dispense: 90 tablet; Refill: 1 - Lipid panel  - follow up in 6 months for CPE  Clarene Reamer, FNP-BC  [facility] [place] [facility]  06/08/2015 7:53 AM

## 2015-06-14 ENCOUNTER — Telehealth: Payer: Self-pay | Admitting: Hematology and Oncology

## 2015-06-14 NOTE — Telephone Encounter (Signed)
Returned call and s.w. Pt and r/s appt per pt request..the patient ok and aware of new d.t

## 2015-06-18 ENCOUNTER — Other Ambulatory Visit: Payer: Managed Care, Other (non HMO)

## 2015-06-18 ENCOUNTER — Ambulatory Visit: Payer: Managed Care, Other (non HMO) | Admitting: Hematology and Oncology

## 2015-06-29 ENCOUNTER — Other Ambulatory Visit: Payer: Self-pay

## 2015-06-29 DIAGNOSIS — C50812 Malignant neoplasm of overlapping sites of left female breast: Secondary | ICD-10-CM

## 2015-07-02 ENCOUNTER — Ambulatory Visit: Payer: Managed Care, Other (non HMO) | Admitting: Hematology and Oncology

## 2015-07-02 ENCOUNTER — Telehealth: Payer: Self-pay | Admitting: Hematology and Oncology

## 2015-07-02 ENCOUNTER — Other Ambulatory Visit: Payer: Managed Care, Other (non HMO)

## 2015-07-02 NOTE — Telephone Encounter (Signed)
returned call and lvm to call back to r/s appt... °

## 2015-07-09 ENCOUNTER — Other Ambulatory Visit (HOSPITAL_BASED_OUTPATIENT_CLINIC_OR_DEPARTMENT_OTHER): Payer: Managed Care, Other (non HMO)

## 2015-07-09 ENCOUNTER — Telehealth: Payer: Self-pay | Admitting: Hematology and Oncology

## 2015-07-09 ENCOUNTER — Ambulatory Visit (HOSPITAL_BASED_OUTPATIENT_CLINIC_OR_DEPARTMENT_OTHER): Payer: Managed Care, Other (non HMO) | Admitting: Hematology and Oncology

## 2015-07-09 ENCOUNTER — Encounter: Payer: Self-pay | Admitting: Hematology and Oncology

## 2015-07-09 VITALS — BP 142/77 | HR 67 | Temp 98.2°F | Resp 18 | Wt 164.0 lb

## 2015-07-09 DIAGNOSIS — C50812 Malignant neoplasm of overlapping sites of left female breast: Secondary | ICD-10-CM

## 2015-07-09 DIAGNOSIS — Z79811 Long term (current) use of aromatase inhibitors: Secondary | ICD-10-CM

## 2015-07-09 DIAGNOSIS — Z170 Estrogen receptor positive status [ER+]: Secondary | ICD-10-CM

## 2015-07-09 DIAGNOSIS — C50412 Malignant neoplasm of upper-outer quadrant of left female breast: Secondary | ICD-10-CM

## 2015-07-09 DIAGNOSIS — M8580 Other specified disorders of bone density and structure, unspecified site: Secondary | ICD-10-CM

## 2015-07-09 LAB — CBC WITH DIFFERENTIAL/PLATELET
BASO%: 0.8 % (ref 0.0–2.0)
BASOS ABS: 0 10*3/uL (ref 0.0–0.1)
EOS ABS: 0.1 10*3/uL (ref 0.0–0.5)
EOS%: 1.6 % (ref 0.0–7.0)
HCT: 39.2 % (ref 34.8–46.6)
HGB: 12.6 g/dL (ref 11.6–15.9)
LYMPH%: 35.6 % (ref 14.0–49.7)
MCH: 26.8 pg (ref 25.1–34.0)
MCHC: 32.1 g/dL (ref 31.5–36.0)
MCV: 83.5 fL (ref 79.5–101.0)
MONO#: 0.5 10*3/uL (ref 0.1–0.9)
MONO%: 9 % (ref 0.0–14.0)
NEUT#: 3 10*3/uL (ref 1.5–6.5)
NEUT%: 53 % (ref 38.4–76.8)
Platelets: 333 10*3/uL (ref 145–400)
RBC: 4.7 10*6/uL (ref 3.70–5.45)
RDW: 14.6 % — ABNORMAL HIGH (ref 11.2–14.5)
WBC: 5.7 10*3/uL (ref 3.9–10.3)
lymph#: 2 10*3/uL (ref 0.9–3.3)

## 2015-07-09 LAB — COMPREHENSIVE METABOLIC PANEL
ALT: 12 U/L (ref 0–55)
AST: 12 U/L (ref 5–34)
Albumin: 3.8 g/dL (ref 3.5–5.0)
Alkaline Phosphatase: 62 U/L (ref 40–150)
Anion Gap: 8 mEq/L (ref 3–11)
BUN: 13.6 mg/dL (ref 7.0–26.0)
CO2: 25 meq/L (ref 22–29)
Calcium: 9.2 mg/dL (ref 8.4–10.4)
Chloride: 107 mEq/L (ref 98–109)
Creatinine: 0.8 mg/dL (ref 0.6–1.1)
GLUCOSE: 84 mg/dL (ref 70–140)
POTASSIUM: 3.6 meq/L (ref 3.5–5.1)
SODIUM: 140 meq/L (ref 136–145)
Total Bilirubin: <0.3 mg/dL (ref 0.20–1.20)
Total Protein: 7.6 g/dL (ref 6.4–8.3)

## 2015-07-09 MED ORDER — ANASTROZOLE 1 MG PO TABS: ORAL_TABLET | ORAL | Status: DC

## 2015-07-09 NOTE — Progress Notes (Signed)
Patient Care Team: Barton Fanny, MD as PCP - General (Family Medicine) Calton Dach, MD as Referring Physician (Optometry) Consuela Mimes, MD as Consulting Physician (Oncology)  DIAGNOSIS: No matching staging information was found for the patient.  SUMMARY OF ONCOLOGIC HISTORY:   Breast cancer of upper-outer quadrant of left female breast ([place])   09/20/2007 Initial Diagnosis Left breast mammogram and ultrasound and biopsy of microcalcifications at 3:00, breast MRI 4.4 cm biopsy-proven DCIS, multiple bilateral fibroadenomas   11/08/2007 Surgery Left breast lumpectomy invasive ductal carcinoma with DCIS grade 2, 1/1 lymph node micrometastases, ER 100%, P 100%, Ki-67 10%, HER-2 negative, T1B N33mc stage IB, Oncotype 10, 7% ROR   01/24/2008 - 03/20/2008 Radiation Therapy Adjuvant radiation therapy   03/21/2008 -  Anti-estrogen oral therapy Tamoxifen from January 2010 to August 2012, switched to anastrozole 1 mg daily    CHIEF COMPLIANT: follow-up on anastrozole  INTERVAL HISTORY: Ellen HAKEEMis a 65year-old with above-mentioned history of left breast cancer treated with lumpectomy radiation as been onoral antiestrogen therapy with anastrozole and appears to be tolerating extremely well. She does not complain of hot flashes or myalgias. Her bone densities have been fairly good.She denies any lumps or nodules in the breasts.  REVIEW OF SYSTEMS:   Constitutional: Denies fevers, chills or abnormal weight loss Eyes: Denies blurriness of vision Ears, nose, mouth, throat, and face: Denies mucositis or sore throat Respiratory: Denies cough, dyspnea or wheezes Cardiovascular: Denies palpitation, chest discomfort Gastrointestinal:  Denies nausea, heartburn or change in bowel habits Skin: Denies abnormal skin rashes Lymphatics: Denies new lymphadenopathy or easy bruising Neurological:Denies numbness, tingling or new weaknesses Behavioral/Psych: Mood is stable, no new changes    Extremities: No lower extremity edema Breast:  denies any pain or lumps or nodules in either breasts All other systems were reviewed with the patient and are negative.  I have reviewed the past medical history, past surgical history, social history and family history with the patient and they are unchanged from previous note.  ALLERGIES:  is allergic to ace inhibitors and simvastatin.  MEDICATIONS:  Current Outpatient Prescriptions  Medication Sig Dispense Refill  . amLODipine (NORVASC) 10 MG tablet Take 1 tablet (10 mg total) by mouth daily. 90 tablet 0  . anastrozole (ARIMIDEX) 1 MG tablet TAKE 1 TABLET (1 MG TOTAL) BY MOUTH DAILY. 90 tablet 3  . aspirin EC 325 MG tablet Take 1 tablet (325 mg total) by mouth daily. 90 tablet 1  . calcium-vitamin D (OSCAL WITH D) 500-200 MG-UNIT per tablet Take 1 tablet by mouth 2 (two) times daily.    . metFORMIN (GLUCOPHAGE) 1000 MG tablet Take 0.5 tablets (500 mg total) by mouth 2 (two) times daily with a meal. 90 tablet 1  . pravastatin (PRAVACHOL) 40 MG tablet Take 1 tablet (40 mg total) by mouth daily. 90 tablet 1   No current facility-administered medications for this visit.    PHYSICAL EXAMINATION: ECOG PERFORMANCE STATUS: 0 - Asymptomatic  Filed Vitals:   07/09/15 0911  BP: 142/77  Pulse: 67  Temp: 98.2 F (36.8 C)  Resp: 18   Filed Weights   07/09/15 0911  Weight: 164 lb (74.39 kg)    GENERAL:alert, no distress and comfortable SKIN: skin color, texture, turgor are normal, no rashes or significant lesions EYES: normal, Conjunctiva are pink and non-injected, sclera clear OROPHARYNX:no exudate, no erythema and lips, buccal mucosa, and tongue normal  NECK: supple, thyroid normal size, non-tender, without nodularity LYMPH:  no palpable lymphadenopathy  in the cervical, axillary or inguinal LUNGS: clear to auscultation and percussion with normal breathing effort HEART: regular rate & rhythm and no murmurs and no lower extremity  edema ABDOMEN:abdomen soft, non-tender and normal bowel sounds MUSCULOSKELETAL:no cyanosis of digits and no clubbing  NEURO: alert & oriented x 3 with fluent speech, no focal motor/sensory deficits EXTREMITIES: No lower extremity edema BREAST: No palpable masses or nodules in either right or left breasts. No palpable axillary supraclavicular or infraclavicular adenopathy no breast tenderness or nipple discharge. (exam performed in the presence of a chaperone)  LABORATORY DATA:  I have reviewed the data as listed   Chemistry      Component Value Date/Time   NA 140 07/09/2015 0859   NA 141 06/05/2015 1331   K 3.6 07/09/2015 0859   K 4.4 06/05/2015 1331   CL 103 06/05/2015 1331   CL 106 05/06/2012 1430   CO2 25 07/09/2015 0859   CO2 26 06/05/2015 1331   BUN 13.6 07/09/2015 0859   BUN 12 06/05/2015 1331   CREATININE 0.8 07/09/2015 0859   CREATININE 0.81 06/05/2015 1331   CREATININE 0.71 10/02/2011 1555   CREATININE 0.8 07/25/2010   GLU 89 07/25/2010      Component Value Date/Time   CALCIUM 9.2 07/09/2015 0859   CALCIUM 9.3 06/05/2015 1331   ALKPHOS 62 07/09/2015 0859   ALKPHOS 71 11/28/2014 1152   AST 12 07/09/2015 0859   AST 15 11/28/2014 1152   ALT 12 07/09/2015 0859   ALT 13 11/28/2014 1152   BILITOT <0.30 07/09/2015 0859   BILITOT 0.4 11/28/2014 1152       Lab Results  Component Value Date   WBC 5.7 07/09/2015   HGB 12.6 07/09/2015   HCT 39.2 07/09/2015   MCV 83.5 07/09/2015   PLT 333 07/09/2015   NEUTROABS 3.0 07/09/2015   ASSESSMENT & PLAN:  Breast cancer of upper-outer quadrant of left female breast ([place]) 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  Anastrozole toxicities: Tolerating anastrozole extremely well 1. Bone density 06/20/2013 shows very mild osteopenia T score -1.1 in the spine and normal hips: Continue with calcium and vitamin D After discussing the pros and cons of continuation of anastrozole beyond 5 years, we have decided to continue anastrozole for next 5 more years. Patient has had no symptoms or side effects and would like to stay on it for 10 years  Breast Cancer Surveillance: 1. Breast exam 07/09/2015: Normal 2. Mammogram 10/10/2014 No abnormalities. Breast Density Category B. I recommended that she get 3-D mammograms annually for surveillance.  Discussed the differences between different breast density categories.  Return to clinic once a year for follow-ups   No orders of the defined types were placed in this encounter.   The patient has a good understanding of the overall plan. she agrees with it. she will call with any problems that may develop before the next visit here.   Rulon Eisenmenger, MD 07/09/2015

## 2015-07-09 NOTE — Assessment & Plan Note (Signed)
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  Anastrozole toxicities: Tolerating anastrozole extremely well 1. Bone density 06/20/2013 shows very mild osteopenia T score -1.1 in the spine and normal hips: Continue with calcium and vitamin D After discussing the pros and cons of continuation of anastrozole beyond 5 years, we have decided to continue anastrozole for next 5 more years. Patient has had no symptoms or side effects and would like to stay on it for 10 years  Breast Cancer Surveillance: 1. Breast exam 07/09/2015: Normal 2. Mammogram 10/10/2014 No abnormalities. Breast Density Category B. I recommended that she get 3-D mammograms annually for surveillance.  Discussed the differences between different breast density categories.  Return to clinic once a year for follow-ups 

## 2015-07-09 NOTE — Telephone Encounter (Signed)
appt made and avs printed °

## 2015-09-26 ENCOUNTER — Other Ambulatory Visit: Payer: Self-pay | Admitting: Family Medicine

## 2015-09-26 DIAGNOSIS — Z1231 Encounter for screening mammogram for malignant neoplasm of breast: Secondary | ICD-10-CM

## 2015-10-09 ENCOUNTER — Other Ambulatory Visit: Payer: Self-pay

## 2015-10-09 DIAGNOSIS — I10 Essential (primary) hypertension: Secondary | ICD-10-CM

## 2015-10-09 MED ORDER — AMLODIPINE BESYLATE 10 MG PO TABS: 10.0000 mg | ORAL_TABLET | Freq: Every day | ORAL | 0 refills | Status: DC

## 2015-10-11 ENCOUNTER — Ambulatory Visit
Admission: RE | Admit: 2015-10-11 | Discharge: 2015-10-11 | Disposition: A | Discharge status: Home / Self Care | Payer: Managed Care, Other (non HMO) | Source: Ambulatory Visit | Attending: Family Medicine | Admitting: Family Medicine

## 2015-10-11 DIAGNOSIS — Z1231 Encounter for screening mammogram for malignant neoplasm of breast: Secondary | ICD-10-CM

## 2015-10-26 ENCOUNTER — Encounter: Payer: Self-pay | Admitting: Hematology and Oncology

## 2015-10-26 NOTE — Progress Notes (Signed)
Received message from triage that patient has a question about a $50 co-pay. Reviewed account and card. Called patient to discuss,wasn't at home, left my contact name and number for pt to return my call.

## 2015-12-13 ENCOUNTER — Other Ambulatory Visit: Payer: Self-pay

## 2015-12-13 MED ORDER — PRAVASTATIN SODIUM 40 MG PO TABS: 40.0000 mg | ORAL_TABLET | Freq: Every day | ORAL | 1 refills | Status: AC

## 2016-01-15 ENCOUNTER — Other Ambulatory Visit: Payer: Self-pay | Admitting: Physician Assistant

## 2016-01-15 DIAGNOSIS — I10 Essential (primary) hypertension: Secondary | ICD-10-CM

## 2016-01-29 ENCOUNTER — Other Ambulatory Visit: Payer: Self-pay

## 2016-01-29 DIAGNOSIS — R739 Hyperglycemia, unspecified: Secondary | ICD-10-CM

## 2016-01-29 MED ORDER — METFORMIN HCL 1000 MG PO TABS: 500.0000 mg | ORAL_TABLET | Freq: Two times a day (BID) | ORAL | 0 refills | Status: DC

## 2016-02-13 ENCOUNTER — Other Ambulatory Visit: Payer: Self-pay | Admitting: Family Medicine

## 2016-02-13 ENCOUNTER — Other Ambulatory Visit: Payer: Self-pay | Admitting: Physician Assistant

## 2016-02-13 DIAGNOSIS — R739 Hyperglycemia, unspecified: Secondary | ICD-10-CM

## 2016-02-13 DIAGNOSIS — I10 Essential (primary) hypertension: Secondary | ICD-10-CM

## 2016-02-19 ENCOUNTER — Other Ambulatory Visit: Payer: Self-pay | Admitting: Physician Assistant

## 2016-02-19 DIAGNOSIS — I10 Essential (primary) hypertension: Secondary | ICD-10-CM

## 2016-03-06 ENCOUNTER — Other Ambulatory Visit: Payer: Self-pay | Admitting: Family Medicine

## 2016-03-06 DIAGNOSIS — R739 Hyperglycemia, unspecified: Secondary | ICD-10-CM

## 2016-03-12 ENCOUNTER — Other Ambulatory Visit: Payer: Self-pay | Admitting: Physician Assistant

## 2016-03-12 DIAGNOSIS — I10 Essential (primary) hypertension: Secondary | ICD-10-CM

## 2016-03-22 ENCOUNTER — Ambulatory Visit (INDEPENDENT_AMBULATORY_CARE_PROVIDER_SITE_OTHER): Payer: Managed Care, Other (non HMO) | Admitting: Emergency Medicine

## 2016-03-22 DIAGNOSIS — I10 Essential (primary) hypertension: Secondary | ICD-10-CM | POA: Diagnosis not present

## 2016-03-22 DIAGNOSIS — R739 Hyperglycemia, unspecified: Secondary | ICD-10-CM

## 2016-03-22 MED ORDER — METFORMIN HCL 1000 MG PO TABS: ORAL_TABLET | ORAL | 3 refills | Status: DC

## 2016-03-22 MED ORDER — AMLODIPINE BESYLATE 10 MG PO TABS: 10.0000 mg | ORAL_TABLET | Freq: Every day | ORAL | 3 refills | Status: DC

## 2016-03-22 NOTE — Progress Notes (Signed)
Ellen [name] 66 y.o.   Chief Complaint  Patient presents with  . Medication Refill    ran out of Amlodipine  . Hypertension    HISTORY OF PRESENT ILLNESS: [name] complaining of needs medication refill. No complaints. Hasn't taken BP med in 2-3 days.  HPI   Prior to Admission medications   Medication Sig Start Date End Date Taking? Authorizing Provider  anastrozole (ARIMIDEX) 1 MG tablet TAKE 1 TABLET (1 MG TOTAL) BY MOUTH DAILY. 07/09/15  Yes Nicholas Lose, MD  aspirin EC 325 MG tablet Take 1 tablet (325 mg total) by mouth daily. 11/09/13  Yes Barton Fanny, MD  calcium-vitamin D (OSCAL WITH D) 500-200 MG-UNIT per tablet Take 1 tablet by mouth 2 (two) times daily.   Yes Historical Provider, MD  metFORMIN (GLUCOPHAGE) 1000 MG tablet TAKE 1/2 TABLET BY MOUTH 2 TIMES DAILY WITH A MEAL 03/22/16  Yes [facility], MD  pravastatin (PRAVACHOL) 40 MG tablet Take 1 tablet (40 mg total) by mouth daily. 12/13/15  Yes Elby Beck, FNP  amLODipine (NORVASC) 10 MG tablet Take 1 tablet (10 mg total) by mouth daily. 03/22/16   Horald Pollen, MD    Allergies  Allergen Reactions  . Ace Inhibitors   . Simvastatin Swelling    lips    Patient Active Problem List   Diagnosis Date Noted  . Malignant neoplasm of overlapping sites of left female breast ([place]) 06/29/2015  . Dry eye syndrome 06/01/2014  . Eye disease 06/01/2014  . Goiter 04/28/2013  . History of tobacco use 11/05/2011  . Breast cancer of upper-outer quadrant of left female breast ([place]) 11/04/2011  . Obesity (BMI 30-39.9) 04/03/2011  . HTN (hypertension)   . COLONIC POLYPS, ADENOMATOUS, HX OF 05/02/2008  . Hyperlipidemia 12/11/2003    Class: Chronic    Past Medical History:  Diagnosis Date  . Breast CA ([place]) 10/2007   S/P Surgery and Radiation Tx completed 03/20/08  . HTN (hypertension)   . Hyperlipidemia   . Thyroid goiter 03/2010   FNA of Left Thyroid Nodule - 05/14/2010     Past Surgical History:  Procedure Laterality Date  . ABDOMINAL HYSTERECTOMY  1986   Heavy bleeding  . BREAST BIOPSY  12/01/2007   Left axillary sentinal node bx : positive  . BREAST SURGERY  11/08/2007   Left partial mastectomy (lumpectomy)  . COLONOSCOPY  2010    Social History   Social History  . Marital status: Divorced    Spouse name: N/A  . Number of children: N/A  . Years of education: N/A   Occupational History  . Freight forwarder     Working 2 jobs.   Social History Main Topics  . Smoking status: Former Smoker    Types: Cigarettes    Quit date: 02/25/2007  . Smokeless tobacco: Never Used  . Alcohol use No  . Drug use: No  . Sexual activity: Not Currently   Other Topics Concern  . Not on file   Social History Narrative   Widowed. Education: [employer].    Family History  Problem Relation Age of Onset  . Cancer Father     Died at 31 of Liver CA  . Diabetes Father   . Colon cancer Neg Hx      Review of Systems  Constitutional: Negative.   HENT: Negative.   Eyes: Negative.   Respiratory: Negative.   Cardiovascular: Negative.   Gastrointestinal: Negative.   Genitourinary:  Negative.   Musculoskeletal: Negative.   Skin: Negative.   Neurological: Negative.   Endo/Heme/Allergies: Negative.   Psychiatric/Behavioral: Negative.   All other systems reviewed and are negative.  Vitals:   03/22/16 1455  BP: (!) 148/90  Pulse: 68  Resp: 16  Temp: 98.1 F (36.7 C)    Physical Exam  Constitutional: She is oriented to person, place, and time. She appears well-developed and well-nourished.  HENT:  Head: Normocephalic and atraumatic.  Nose: Nose normal.  Mouth/Throat: Oropharynx is clear and moist. No oropharyngeal exudate.  Eyes: Conjunctivae and EOM are normal. Pupils are equal, round, and reactive to light.  Neck: Normal range of motion. Neck supple. No JVD present. No thyromegaly present.  Cardiovascular: Normal rate, regular rhythm and  normal heart sounds.   Pulmonary/Chest: Effort normal and breath sounds normal.  Abdominal: Soft.  Musculoskeletal: Normal range of motion.  Lymphadenopathy:    She has no cervical adenopathy.  Neurological: She is alert and oriented to person, place, and time. No sensory deficit. She exhibits normal muscle tone.  Skin: Skin is warm and dry. Capillary refill takes less than 2 seconds.  Psychiatric: She has a normal mood and affect. Her behavior is normal.  Vitals reviewed.    ASSESSMENT & PLAN: Ellen [name] was seen today for medication refill and hypertension.  Diagnoses and all orders for this visit:  Elevated blood sugar -     metFORMIN (GLUCOPHAGE) 1000 MG tablet; TAKE 1/2 TABLET BY MOUTH 2 TIMES DAILY WITH A MEAL  Essential hypertension -     amLODipine (NORVASC) 10 MG tablet; Take 1 tablet (10 mg total) by mouth daily.    Patient Instructions       IF you received an x-ray today, you will receive an invoice from [facility]. Please contact [facility] at [phone] with questions or concerns regarding your invoice.   IF you received labwork today, you will receive an invoice from [facility]. Please contact [facility] at [phone] with questions or concerns regarding your invoice.   Our billing staff will not be able to assist you with questions regarding bills from these companies.  You will be contacted with the lab results as soon as they are available. The fastest way to get your results is to activate your My Chart account. Instructions are located on the last page of this paperwork. If you have not heard from [place] regarding the results in 2 weeks, please contact this office.      Hypertension Hypertension is another name for high blood pressure. High blood pressure forces your heart to work harder to pump blood. A blood pressure reading has two numbers, which includes a higher number over a lower number (example: 110/72). Follow these instructions at  home:  Have your blood pressure rechecked by your doctor.  Only take medicine as told by your doctor. Follow the directions carefully. The medicine does not work as well if you skip doses. Skipping doses also puts you at risk for problems.  Do not smoke.  Monitor your blood pressure at home as told by your doctor. Contact a doctor if:  You think you are having a reaction to the medicine you are taking.  You have repeat headaches or feel dizzy.  You have puffiness (swelling) in your ankles.  You have trouble with your vision. Get help right away if:  You get a very bad headache and are confused.  You feel weak, numb, or faint.  You get chest or belly (abdominal) pain.  You throw up (vomit).  You cannot breathe very well. This information is not intended to replace advice given to you by your health care provider. Make sure you discuss any questions you have with your health care provider. Document Released: 07/30/2007 Document Revised: 07/19/2015 Document Reviewed: 12/03/2012 Elsevier Interactive Patient Education  2017 Elsevier Inc.     Agustina Caroli, MD [facility]

## 2016-03-22 NOTE — Patient Instructions (Addendum)
     IF you received an x-ray today, you will receive an invoice from [facility]. Please contact [facility] at [phone] with questions or concerns regarding your invoice.   IF you received labwork today, you will receive an invoice from [facility]. Please contact [facility] at [phone] with questions or concerns regarding your invoice.   Our billing staff will not be able to assist you with questions regarding bills from these companies.  You will be contacted with the lab results as soon as they are available. The fastest way to get your results is to activate your My Chart account. Instructions are located on the last page of this paperwork. If you have not heard from [place] regarding the results in 2 weeks, please contact this office.      Hypertension Hypertension is another name for high blood pressure. High blood pressure forces your heart to work harder to pump blood. A blood pressure reading has two numbers, which includes a higher number over a lower number (example: 110/72). Follow these instructions at home:  Have your blood pressure rechecked by your doctor.  Only take medicine as told by your doctor. Follow the directions carefully. The medicine does not work as well if you skip doses. Skipping doses also puts you at risk for problems.  Do not smoke.  Monitor your blood pressure at home as told by your doctor. Contact a doctor if:  You think you are having a reaction to the medicine you are taking.  You have repeat headaches or feel dizzy.  You have puffiness (swelling) in your ankles.  You have trouble with your vision. Get help right away if:  You get a very bad headache and are confused.  You feel weak, numb, or faint.  You get chest or belly (abdominal) pain.  You throw up (vomit).  You cannot breathe very well. This information is not intended to replace advice given to you by your health care provider. Make sure you discuss any  questions you have with your health care provider. Document Released: 07/30/2007 Document Revised: 07/19/2015 Document Reviewed: 12/03/2012 Elsevier Interactive Patient Education  2017 [employer].

## 2016-07-08 ENCOUNTER — Other Ambulatory Visit: Payer: Self-pay | Admitting: Hematology and Oncology

## 2016-07-08 ENCOUNTER — Other Ambulatory Visit: Payer: Self-pay

## 2016-07-08 ENCOUNTER — Encounter: Payer: Self-pay | Admitting: Hematology and Oncology

## 2016-07-08 ENCOUNTER — Ambulatory Visit (HOSPITAL_BASED_OUTPATIENT_CLINIC_OR_DEPARTMENT_OTHER): Payer: Managed Care, Other (non HMO) | Admitting: Hematology and Oncology

## 2016-07-08 DIAGNOSIS — C50412 Malignant neoplasm of upper-outer quadrant of left female breast: Secondary | ICD-10-CM

## 2016-07-08 DIAGNOSIS — E2839 Other primary ovarian failure: Secondary | ICD-10-CM

## 2016-07-08 DIAGNOSIS — Z170 Estrogen receptor positive status [ER+]: Secondary | ICD-10-CM

## 2016-07-08 DIAGNOSIS — Z79811 Long term (current) use of aromatase inhibitors: Secondary | ICD-10-CM | POA: Diagnosis not present

## 2016-07-08 DIAGNOSIS — M8580 Other specified disorders of bone density and structure, unspecified site: Secondary | ICD-10-CM | POA: Diagnosis not present

## 2016-07-08 MED ORDER — ANASTROZOLE 1 MG PO TABS: ORAL_TABLET | ORAL | 3 refills | Status: DC

## 2016-07-08 NOTE — Progress Notes (Signed)
Patient Care Team: Patient, No Pcp Per as PCP - General (General Practice) Shirley Muscat, Loreen Freud, MD as Referring Physician (Optometry) Marcy Panning, MD as Consulting Physician (Oncology)  DIAGNOSIS:  Encounter Diagnosis  Name Primary?  . Malignant neoplasm of upper-outer quadrant of left breast in female, estrogen receptor positive ([place])     SUMMARY OF ONCOLOGIC HISTORY:   Breast cancer of upper-outer quadrant of left female breast ([place])   09/20/2007 Initial Diagnosis    Left breast mammogram and ultrasound and biopsy of microcalcifications at 3:00, breast MRI 4.4 cm biopsy-proven DCIS, multiple bilateral fibroadenomas      11/08/2007 Surgery    Left breast lumpectomy invasive ductal carcinoma with DCIS grade 2, 1/1 lymph node micrometastases, ER 100%, P 100%, Ki-67 10%, HER-2 negative, T1B N36mc stage IB, Oncotype 10, 7% ROR      01/24/2008 - 03/20/2008 Radiation Therapy    Adjuvant radiation therapy      03/21/2008 -  Anti-estrogen oral therapy    Tamoxifen from January 2010 to August 2012, switched to anastrozole 1 mg daily       CHIEF COMPLIANT: Follow-up on anastrozole therapy  INTERVAL HISTORY: HDEBORAH LAZCANOis a 66year old with above-mentioned history of left breast cancer currently on adjuvant antiestrogen therapy with anastrozole. She is tolerating it extremely well. She denies any hot flashes or myalgias. She denies any lumps or nodules in the breast.  REVIEW OF SYSTEMS:   Constitutional: Denies fevers, chills or abnormal weight loss Eyes: Denies blurriness of vision Ears, nose, mouth, throat, and face: Denies mucositis or sore throat Respiratory: Denies cough, dyspnea or wheezes Cardiovascular: Denies palpitation, chest discomfort Gastrointestinal:  Denies nausea, heartburn or change in bowel habits Skin: Denies abnormal skin rashes Lymphatics: Denies new lymphadenopathy or easy bruising Neurological:Denies numbness, tingling or new  weaknesses Behavioral/Psych: Mood is stable, no new changes  Extremities: No lower extremity edema Breast:  denies any pain or lumps or nodules in either breasts All other systems were reviewed with the patient and are negative.  I have reviewed the past medical history, past surgical history, social history and family history with the patient and they are unchanged from previous note.  ALLERGIES:  is allergic to ace inhibitors and simvastatin.  MEDICATIONS:  Current Outpatient Prescriptions  Medication Sig Dispense Refill  . amLODipine (NORVASC) 10 MG tablet Take 1 tablet (10 mg total) by mouth daily. 90 tablet 3  . anastrozole (ARIMIDEX) 1 MG tablet TAKE 1 TABLET (1 MG TOTAL) BY MOUTH DAILY. 90 tablet 3  . aspirin EC 325 MG tablet Take 1 tablet (325 mg total) by mouth daily. 90 tablet 1  . calcium-vitamin D (OSCAL WITH D) 500-200 MG-UNIT per tablet Take 1 tablet by mouth 2 (two) times daily.    . metFORMIN (GLUCOPHAGE) 1000 MG tablet TAKE 1/2 TABLET BY MOUTH 2 TIMES DAILY WITH A MEAL 90 tablet 3  . pravastatin (PRAVACHOL) 40 MG tablet Take 1 tablet (40 mg total) by mouth daily. 90 tablet 1   No current facility-administered medications for this visit.     PHYSICAL EXAMINATION: ECOG PERFORMANCE STATUS: 0 - Asymptomatic  Vitals:   07/08/16 0919  BP: (!) 141/72  Pulse: 61  Resp: 17  Temp: 97.7 F (36.5 C)   Filed Weights   07/08/16 0919  Weight: 154 lb 14.4 oz (70.3 kg)    GENERAL:alert, no distress and comfortable SKIN: skin color, texture, turgor are normal, no rashes or significant lesions EYES: normal, Conjunctiva are pink and non-injected, sclera  clear OROPHARYNX:no exudate, no erythema and lips, buccal mucosa, and tongue normal  NECK: supple, thyroid normal size, non-tender, without nodularity LYMPH:  no palpable lymphadenopathy in the cervical, axillary or inguinal LUNGS: clear to auscultation and percussion with normal breathing effort HEART: regular rate & rhythm  and no murmurs and no lower extremity edema ABDOMEN:abdomen soft, non-tender and normal bowel sounds MUSCULOSKELETAL:no cyanosis of digits and no clubbing  NEURO: alert & oriented x 3 with fluent speech, no focal motor/sensory deficits EXTREMITIES: No lower extremity edema BREAST: No palpable masses or nodules in either right or left breasts. No palpable axillary supraclavicular or infraclavicular adenopathy no breast tenderness or nipple discharge. (exam performed in the presence of a chaperone)  LABORATORY DATA:  I have reviewed the data as listed   Chemistry      Component Value Date/Time   NA 140 07/09/2015 0859   K 3.6 07/09/2015 0859   CL 103 06/05/2015 1331   CL 106 05/06/2012 1430   CO2 25 07/09/2015 0859   BUN 13.6 07/09/2015 0859   CREATININE 0.8 07/09/2015 0859   GLU 89 07/25/2010      Component Value Date/Time   CALCIUM 9.2 07/09/2015 0859   ALKPHOS 62 07/09/2015 0859   AST 12 07/09/2015 0859   ALT 12 07/09/2015 0859   BILITOT <0.30 07/09/2015 0859       Lab Results  Component Value Date   WBC 5.7 07/09/2015   HGB 12.6 07/09/2015   HCT 39.2 07/09/2015   MCV 83.5 07/09/2015   PLT 333 07/09/2015   NEUTROABS 3.0 07/09/2015    ASSESSMENT & PLAN:  Breast cancer of upper-outer quadrant of left female breast ([name]) 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  Anastrozole toxicities: Tolerating anastrozole extremely well 1. Bone density 06/20/2013 shows very mild osteopenia T score -1.1 in the spine and normal hips: Continue with calcium and vitamin D. Patient will need a new bone density test After discussing the pros and cons of continuation  of anastrozole beyond 5 years, we have decided to continue anastrozole for next 5 more years. Patient has had no symptoms or side effects and would like to stay on it for 10 years Also discussed the recently presented in clinical trial ABCSG 16 which suggested to 7 years of antiestrogen therapy is equivalent to 10 years. After discussing the pros and cons, we decided to remain on antiestrogen therapy for the full 10 years.  Breast Cancer Surveillance: 1. Breast exam 07/08/2016: Normal 2. Mammogram 10/12/2015 No abnormalities. Breast Density Category C. I recommended that she get 3-D mammograms annually for surveillance.  I will order a bone density test to be done at the breast center. Previous bone density test was normal. This was done in April 2015.  Return to clinic once a year for follow-ups   I spent 25 minutes talking to the patient of which more than half was spent in counseling and coordination of care.  Orders Placed This Encounter  Procedures  . DG Bone Density    Standing Status:   Future    Standing Expiration Date:   07/08/2017    Order Specific Question:   Reason for Exam (SYMPTOM  OR DIAGNOSIS REQUIRED)  Answer:   Post menopausal on aromatase inhibitor therapy    Order Specific Question:   Preferred imaging location?    Answer:   [facility]   The patient has a good understanding of the overall plan. she agrees with it. she will call with any problems that may develop before the next visit here.   Rulon Eisenmenger, MD 07/08/16

## 2016-07-08 NOTE — Assessment & Plan Note (Addendum)
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  Anastrozole toxicities: Tolerating anastrozole extremely well 1. Bone density 06/20/2013 shows very mild osteopenia T score -1.1 in the spine and normal hips: Continue with calcium and vitamin D. Patient will need a new bone density test After discussing the pros and cons of continuation of anastrozole beyond 5 years, we have decided to continue anastrozole for next 5 more years. Patient has had no symptoms or side effects and would like to stay on it for 10 years  Breast Cancer Surveillance: 1. Breast exam 07/08/2016: Normal 2. Mammogram 10/12/2015 No abnormalities. Breast Density Category C. I recommended that she get 3-D mammograms annually for surveillance.   Return to clinic once a year for follow-ups

## 2016-07-12 ENCOUNTER — Other Ambulatory Visit: Payer: Self-pay | Admitting: Family Medicine

## 2016-07-14 ENCOUNTER — Ambulatory Visit
Admission: RE | Admit: 2016-07-14 | Discharge: 2016-07-14 | Disposition: A | Discharge status: Home / Self Care | Payer: Managed Care, Other (non HMO) | Source: Ambulatory Visit | Attending: Hematology and Oncology | Admitting: Hematology and Oncology

## 2016-07-14 DIAGNOSIS — E2839 Other primary ovarian failure: Secondary | ICD-10-CM

## 2016-07-16 NOTE — Telephone Encounter (Signed)
Pharmacy is calling back to follow up on request

## 2016-09-09 ENCOUNTER — Other Ambulatory Visit: Payer: Self-pay | Admitting: Family Medicine

## 2016-09-09 DIAGNOSIS — Z1231 Encounter for screening mammogram for malignant neoplasm of breast: Secondary | ICD-10-CM

## 2016-10-02 ENCOUNTER — Ambulatory Visit: Payer: Managed Care, Other (non HMO) | Admitting: Emergency Medicine

## 2016-10-13 ENCOUNTER — Ambulatory Visit
Admission: RE | Admit: 2016-10-13 | Discharge: 2016-10-13 | Disposition: A | Discharge status: Home / Self Care | Payer: Managed Care, Other (non HMO) | Source: Ambulatory Visit | Attending: Family Medicine | Admitting: Family Medicine

## 2016-10-13 DIAGNOSIS — Z1231 Encounter for screening mammogram for malignant neoplasm of breast: Secondary | ICD-10-CM

## 2016-10-13 HISTORY — DX: Personal history of irradiation: Z92.3

## 2016-12-02 ENCOUNTER — Telehealth: Payer: Self-pay | Admitting: Hematology and Oncology

## 2016-12-02 ENCOUNTER — Other Ambulatory Visit: Payer: Self-pay

## 2016-12-02 MED ORDER — ANASTROZOLE 1 MG PO TABS: ORAL_TABLET | ORAL | 3 refills | Status: DC

## 2016-12-02 NOTE — Telephone Encounter (Signed)
11/28/16 prescription refill. Prescription scanned in media °

## 2017-01-28 ENCOUNTER — Other Ambulatory Visit: Payer: Self-pay

## 2017-01-28 MED ORDER — ANASTROZOLE 1 MG PO TABS: ORAL_TABLET | ORAL | 3 refills | Status: DC

## 2017-01-30 ENCOUNTER — Telehealth: Payer: Self-pay | Admitting: *Deleted

## 2017-01-30 NOTE — Telephone Encounter (Signed)
Faxed Rx Metformin 1000 mg to [facility] Confirmation page received at 10:26 am.

## 2017-02-04 ENCOUNTER — Other Ambulatory Visit: Payer: Self-pay | Admitting: *Deleted

## 2017-02-04 MED ORDER — ANASTROZOLE 1 MG PO TABS: ORAL_TABLET | ORAL | 3 refills | Status: DC

## 2017-02-06 ENCOUNTER — Other Ambulatory Visit: Payer: Self-pay

## 2017-02-06 MED ORDER — ANASTROZOLE 1 MG PO TABS: ORAL_TABLET | ORAL | 3 refills | Status: DC

## 2017-03-24 ENCOUNTER — Other Ambulatory Visit: Payer: Self-pay | Admitting: Family Medicine

## 2017-03-26 ENCOUNTER — Other Ambulatory Visit: Payer: Self-pay | Admitting: Internal Medicine

## 2017-03-26 DIAGNOSIS — E2839 Other primary ovarian failure: Secondary | ICD-10-CM

## 2017-04-10 ENCOUNTER — Other Ambulatory Visit: Payer: Managed Care, Other (non HMO)

## 2017-04-20 ENCOUNTER — Encounter (HOSPITAL_COMMUNITY): Payer: Self-pay | Admitting: Emergency Medicine

## 2017-04-20 ENCOUNTER — Emergency Department (HOSPITAL_COMMUNITY)
Admission: EM | Admit: 2017-04-20 | Discharge: 2017-04-20 | Disposition: A | Discharge status: Home / Self Care | Payer: Medicare HMO | Attending: Emergency Medicine | Admitting: Emergency Medicine

## 2017-04-20 DIAGNOSIS — M545 Low back pain: Secondary | ICD-10-CM | POA: Diagnosis present

## 2017-04-20 DIAGNOSIS — Z87891 Personal history of nicotine dependence: Secondary | ICD-10-CM | POA: Diagnosis not present

## 2017-04-20 DIAGNOSIS — Z7982 Long term (current) use of aspirin: Secondary | ICD-10-CM | POA: Diagnosis not present

## 2017-04-20 DIAGNOSIS — I10 Essential (primary) hypertension: Secondary | ICD-10-CM | POA: Diagnosis not present

## 2017-04-20 DIAGNOSIS — Z79899 Other long term (current) drug therapy: Secondary | ICD-10-CM | POA: Diagnosis not present

## 2017-04-20 DIAGNOSIS — M5442 Lumbago with sciatica, left side: Secondary | ICD-10-CM | POA: Diagnosis not present

## 2017-04-20 DIAGNOSIS — Z853 Personal history of malignant neoplasm of breast: Secondary | ICD-10-CM | POA: Diagnosis not present

## 2017-04-20 MED ORDER — ACETAMINOPHEN 325 MG PO TABS: 650.0000 mg | ORAL_TABLET | Freq: Four times a day (QID) | ORAL | 0 refills | Status: DC | PRN

## 2017-04-20 MED ORDER — IBUPROFEN 600 MG PO TABS: 600.0000 mg | ORAL_TABLET | Freq: Four times a day (QID) | ORAL | 0 refills | Status: DC | PRN

## 2017-04-20 NOTE — ED Provider Notes (Signed)
[facility] DEPT Provider Note   CSN: [id number] Arrival date & time: 04/20/17  1746     History   Chief Complaint Chief Complaint  Patient presents with  . Back Pain    HPI Ellen [name] is a 67 y.o. female.  HPI   Pt is a 67 y/o female who presents to the ED today c/o constant left lower back pain that began 2 days ago. Pt states that the pain radiates down the posterior aspect of the left leg and stops at the knee. States that " I feel like I pulled a muscle". States she has been able to ambulate, however it makes that pain worse. Rates pain 10/10. States when she is sitting she has no pain. She has tried taking ibuprofen with no relief.   Pt denies any numbness/tingling/weakness to the BLE. Denies saddle anesthesia. Denies loss of control of bowels or bladder. No urinary retention. No fevers. Denies a h/o IVDU. Denies a h/o CA or recent unintended weight loss.   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 Does note that she lifted some heavy trash the night before symptoms started.   Past Medical History:  Diagnosis Date  . Breast CA ([place]) 10/2007   [name]/P Surgery and Radiation Tx completed 03/20/08  . HTN (hypertension)   . Hyperlipidemia   . Personal history of radiation therapy 2009  . Thyroid goiter 03/2010   FNA of Left Thyroid Nodule - 05/14/2010    Patient Active Problem List   Diagnosis Date Noted  . Malignant neoplasm of overlapping sites of left female breast ([place]) 06/29/2015  . Dry eye syndrome 06/01/2014  . Eye disease 06/01/2014  . Goiter 04/28/2013  . History of tobacco use 11/05/2011  . Breast cancer of upper-outer quadrant of left female breast ([place]) 11/04/2011  . Obesity (BMI 30-39.9) 04/03/2011  . HTN (hypertension)   . COLONIC POLYPS, ADENOMATOUS, HX OF 05/02/2008  . Hyperlipidemia 12/11/2003    Class: Chronic    Past Surgical History:  Procedure Laterality Date  . ABDOMINAL HYSTERECTOMY  1986   Heavy bleeding  . BREAST BIOPSY  12/01/2007   Left axillary sentinal node bx : positive  . BREAST SURGERY  11/08/2007   Left partial mastectomy (lumpectomy)  . COLONOSCOPY  2010    OB History    No data available       Home Medications    Prior to Admission medications   Medication Sig Start Date End Date Taking? Authorizing Provider  acetaminophen (TYLENOL) 325 MG tablet Take 2 tablets (650 mg total) by mouth every 6 (six) hours as needed. Do not take more than 4000mg  of tylenol per day 04/20/17   [name], Ellen [name]  amLODipine (NORVASC) 10 MG tablet Take 1 tablet (10 mg total) by mouth daily. 03/22/16   Horald Pollen, MD  anastrozole (ARIMIDEX) 1 MG tablet TAKE 1 TABLET (1 MG TOTAL) BY MOUTH DAILY. 02/06/17   Nicholas Lose, MD  aspirin EC 325 MG tablet Take 1 tablet (325 mg total) by mouth daily. 11/09/13   Barton Fanny, MD  calcium-vitamin D (OSCAL WITH D) 500-200 MG-UNIT per tablet Take 1 tablet by mouth 2 (two) times daily.    [provider]  ibuprofen (ADVIL,MOTRIN) 600 MG tablet Take 1 tablet (600 mg total) by mouth every 6 (six) hours as needed. 04/20/17   [name], Ellen [name]  metFORMIN (GLUCOPHAGE) 1000 MG tablet TAKE 1/2 TABLET BY MOUTH 2 TIMES  DAILY WITH A MEAL 03/22/16   Horald Pollen, MD  pravastatin (PRAVACHOL) 40 MG tablet Take 1 tablet (40 mg total) by mouth daily. 12/13/15   Elby Beck, FNP    Family History Family History  Problem Relation Age of Onset  . Cancer Father        Died at 55 of Liver CA  . Diabetes Father   . Colon cancer Neg Hx     Social History Social History   Tobacco Use  . Smoking status: Former Smoker    Types: Cigarettes    Last attempt to quit: 02/25/2007    Years since quitting: 10.1  . Smokeless tobacco: Never Used  Substance Use Topics  . Alcohol use: No  . Drug use: No     Allergies   Ace inhibitors and Simvastatin   Review of Systems Review of Systems  Constitutional:  Negative for fever.  Respiratory: Negative for shortness of breath.   Cardiovascular: Negative for chest pain.  Gastrointestinal: Negative for abdominal pain.  Genitourinary: Negative for frequency and urgency.  Musculoskeletal: Positive for back pain. Negative for gait problem.  Neurological: Negative for weakness and numbness.     Physical Exam Updated Vital Signs BP (!) 150/87 (BP Location: Right Arm)   Pulse 93   Temp 98.7 F (37.1 C) (Oral)   Resp 18   SpO2 100%   Physical Exam  Constitutional: She appears well-developed and well-nourished. No distress.  HENT:  Head: Normocephalic and atraumatic.  Eyes: Conjunctivae are normal.  Neck: Neck supple.  Cardiovascular: Normal rate, regular rhythm, normal heart sounds and intact distal pulses.  No murmur heard. Pulmonary/Chest: Effort normal and breath sounds normal. No respiratory distress.  Abdominal: Soft. Bowel sounds are normal. She exhibits no distension. There is no tenderness.  Musculoskeletal: She exhibits no edema.  ttp over the left sciatic notch that reproduces pain. No TTP to the cervical, thoracic, or lumbar spine. No pain to the paraspinous muscles.   Neurological: She is alert.  Motor:  Normal tone. 5/5 strength of BUE and BLE major muscle groups including strong and equal grip strength and dorsiflexion/plantar flexion Sensory: light touch normal in all extremities. DTRs: patellar and achilles 1+ symmetric b/l Gait: normal gait and balance.  CV: 2+ radial and DP/PT pulses   Skin: Skin is warm and dry.  Psychiatric: She has a normal mood and affect.  Nursing note and vitals reviewed.    ED Treatments / Results  Labs (all labs ordered are listed, but only abnormal results are displayed) Labs Reviewed - No data to display  EKG  EKG Interpretation None       Radiology No results found.  Procedures Procedures (including critical care time)  Medications Ordered in ED Medications - No data to  display   Initial Impression / Assessment and Plan / ED Course  I have reviewed the triage vital signs and the nursing notes.  Pertinent labs & imaging results that were available during my care of the patient were reviewed by me and considered in my medical decision making (see chart for details).    Discussed pt presentation and exam findings with Dr. Ellender Hose, who agrees with the plan to d/c pt with outpt f/u.   Final Clinical Impressions([name]) / ED Diagnoses   Final diagnoses:  Acute left-sided low back pain with left-sided sciatica   Normal neurological exam, no evidence of urinary incontinence or retention, pain is consistently reproducible. There is no evidence of AAA or concern  for dissection at this time.   Patient can walk but states is painful.  No loss of bowel or bladder control.  No concern for cauda equina.  No fever, night sweats, weight loss, h/o cancer, IVDU.  Pain treated here in the department with adequate improvement. RICE protocol and pain medicine indicated and discussed with patient. I have also discussed reasons to return immediately to the ER.  Patient expresses understanding and agrees with plan.  ED Discharge Orders        Ordered    acetaminophen (TYLENOL) 325 MG tablet  Every 6 hours PRN     04/20/17 2019    ibuprofen (ADVIL,MOTRIN) 600 MG tablet  Every 6 hours PRN     04/20/17 2019       Ellen Ruesch [name], Ellen [name] 04/20/17 2023    Duffy Bruce, MD 04/20/17 2312

## 2017-04-20 NOTE — Discharge Instructions (Signed)
You may alternate taking Tylenol and Ibuprofen as needed for pain control. You may take 400-600 mg of ibuprofen every 6 hours and 500-1000 mg of Tylenol every 6 hours. Do not exceed 4000 mg of Tylenol daily as this can lead to liver damage. Also, make sure to take Ibuprofen with meals as it can cause an upset stomach. Do not take other NSAIDs while taking Ibuprofen such as (Aleve, Naprosyn, Aspirin, Celebrex, etc) and do not take more than the prescribed dose as this can lead to ulcers and bleeding in your GI tract. You may use warm and cold compresses to help with your symptoms.  ° °Please follow up with your primary doctor within the next 7-10 days for re-evaluation and further treatment of your symptoms.  ° °Return to the emergency department immediately if you experience any back pain associated with fevers, loss of control of your bowels/bladder, weakness/numbness to your legs, numbness to your groin area, inability to walk, or inability to urinate.  ° ° °

## 2017-04-20 NOTE — ED Triage Notes (Signed)
Patient c/o left lower back pain that radiates down left leg that started on Saturday. Reports day before that she was emptying trash and had heavy books in it. Denies any falls or injuries or urinary problems.

## 2017-04-22 ENCOUNTER — Encounter (HOSPITAL_COMMUNITY): Payer: Self-pay | Admitting: Emergency Medicine

## 2017-04-22 ENCOUNTER — Emergency Department (HOSPITAL_COMMUNITY)
Admission: EM | Admit: 2017-04-22 | Discharge: 2017-04-23 | Disposition: A | Discharge status: Home / Self Care | Payer: Medicare HMO | Attending: Emergency Medicine | Admitting: Emergency Medicine

## 2017-04-22 ENCOUNTER — Emergency Department (HOSPITAL_COMMUNITY): Payer: Medicare HMO

## 2017-04-22 DIAGNOSIS — R1032 Left lower quadrant pain: Secondary | ICD-10-CM | POA: Diagnosis not present

## 2017-04-22 DIAGNOSIS — Z87891 Personal history of nicotine dependence: Secondary | ICD-10-CM | POA: Diagnosis not present

## 2017-04-22 DIAGNOSIS — Z853 Personal history of malignant neoplasm of breast: Secondary | ICD-10-CM | POA: Insufficient documentation

## 2017-04-22 DIAGNOSIS — Z79899 Other long term (current) drug therapy: Secondary | ICD-10-CM | POA: Insufficient documentation

## 2017-04-22 DIAGNOSIS — I10 Essential (primary) hypertension: Secondary | ICD-10-CM | POA: Insufficient documentation

## 2017-04-22 DIAGNOSIS — M5432 Sciatica, left side: Secondary | ICD-10-CM | POA: Diagnosis not present

## 2017-04-22 DIAGNOSIS — M545 Low back pain: Secondary | ICD-10-CM | POA: Diagnosis present

## 2017-04-22 LAB — COMPREHENSIVE METABOLIC PANEL
ALT: 13 U/L — AB (ref 14–54)
AST: 19 U/L (ref 15–41)
Albumin: 4.7 g/dL (ref 3.5–5.0)
Alkaline Phosphatase: 58 U/L (ref 38–126)
Anion gap: 12 (ref 5–15)
BUN: 10 mg/dL (ref 6–20)
CHLORIDE: 103 mmol/L (ref 101–111)
CO2: 26 mmol/L (ref 22–32)
CREATININE: 0.72 mg/dL (ref 0.44–1.00)
Calcium: 9.9 mg/dL (ref 8.9–10.3)
GFR calc non Af Amer: >60 mL/min (ref >60)
Glucose, Bld: 95 mg/dL (ref 65–99)
POTASSIUM: 3.4 mmol/L — AB (ref 3.5–5.1)
SODIUM: 141 mmol/L (ref 135–145)
Total Bilirubin: 0.4 mg/dL (ref 0.3–1.2)
Total Protein: 8.6 g/dL — ABNORMAL HIGH (ref 6.5–8.1)

## 2017-04-22 LAB — CBC
HEMATOCRIT: 41.9 % (ref 36.0–46.0)
HEMOGLOBIN: 13.9 g/dL (ref 12.0–15.0)
MCH: 28.6 pg (ref 26.0–34.0)
MCHC: 33.2 g/dL (ref 30.0–36.0)
MCV: 86.2 fL (ref 78.0–100.0)
PLATELETS: 485 10*3/uL — AB (ref 150–400)
RBC: 4.86 MIL/uL (ref 3.87–5.11)
RDW: 14 % (ref 11.5–15.5)
WBC: 10.5 10*3/uL (ref 4.0–10.5)

## 2017-04-22 LAB — URINALYSIS, ROUTINE W REFLEX MICROSCOPIC
BACTERIA UA: NONE SEEN
BILIRUBIN URINE: NEGATIVE
Glucose, UA: NEGATIVE mg/dL
KETONES UR: NEGATIVE mg/dL
LEUKOCYTES UA: NEGATIVE
Nitrite: NEGATIVE
PROTEIN: NEGATIVE mg/dL
Specific Gravity, Urine: 1.026 (ref 1.005–1.030)
pH: 8 (ref 5.0–8.0)

## 2017-04-22 LAB — LIPASE, BLOOD: LIPASE: 37 U/L (ref 11–51)

## 2017-04-22 MED ORDER — MORPHINE SULFATE (PF) 4 MG/ML IV SOLN
4.0000 mg | Freq: Once | INTRAVENOUS | Status: AC
  Administered 2017-04-22: 4 mg via INTRAVENOUS
  Filled 2017-04-22: qty 1

## 2017-04-22 MED ORDER — PREDNISONE 10 MG PO TABS: 10.0000 mg | ORAL_TABLET | Freq: Every day | ORAL | 0 refills | Status: DC

## 2017-04-22 MED ORDER — ONDANSETRON HCL 4 MG/2ML IJ SOLN
4.0000 mg | Freq: Once | INTRAMUSCULAR | Status: AC
  Administered 2017-04-22: 4 mg via INTRAVENOUS
  Filled 2017-04-22: qty 2

## 2017-04-22 MED ORDER — OXYCODONE-ACETAMINOPHEN 5-325 MG PO TABS: 1.0000 | ORAL_TABLET | ORAL | 0 refills | Status: DC | PRN

## 2017-04-22 MED ORDER — IOPAMIDOL (ISOVUE-300) INJECTION 61%
INTRAVENOUS | Status: AC
Start: 2017-04-22 — End: 2017-04-22
  Administered 2017-04-22: 100 mL via INTRAVENOUS
  Filled 2017-04-22: qty 100

## 2017-04-22 NOTE — ED Notes (Signed)
No respiratory or acute distress noted alert and oriented x 3 call light in reach family at bedside no reaction to medication noted. 

## 2017-04-22 NOTE — Discharge Instructions (Signed)
Tests showed no life-threatening condition.  I believe this is probably sciatica pain.  Prescription for pain medicine and prednisone.  CT scan showed some small breast masses which are probably benign.  Follow-up with your normal mammogram as you normally do.  Your primary care doctor can review all of your tests.

## 2017-04-22 NOTE — ED Provider Notes (Signed)
[facility] DEPT Provider Note   CSN: [id number] Arrival date & time: 04/22/17  1857     History   Chief Complaint Chief Complaint  Patient presents with  . Abdominal Pain    HPI Ellen [name] is a 67 y.o. female.  Patient presents with pain in the left lower back with radiation to the mid left anterior thigh with a component of pain in the left lower quadrant.  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  She is eating without vomiting or diarrhea.  No fever, sweats, chills, dysuria, hematuria, vaginal bleeding, vaginal discharge.  Severity of pain is moderate.  Positioning and ambulation make pain worse.  No bowel or bladder incontinence.      Past Medical History:  Diagnosis Date  . Breast CA ([place]) 10/2007   S/P Surgery and Radiation Tx completed 03/20/08  . HTN (hypertension)   . Hyperlipidemia   . Personal history of radiation therapy 2009  . Thyroid goiter 03/2010   FNA of Left Thyroid Nodule - 05/14/2010    Patient Active Problem List   Diagnosis Date Noted  . Malignant neoplasm of overlapping sites of left female breast ([place]) 06/29/2015  . Dry eye syndrome 06/01/2014  . Eye disease 06/01/2014  . Goiter 04/28/2013  . History of tobacco use 11/05/2011  . Breast cancer of upper-outer quadrant of left female breast ([place]) 11/04/2011  . Obesity (BMI 30-39.9) 04/03/2011  . HTN (hypertension)   . COLONIC POLYPS, ADENOMATOUS, HX OF 05/02/2008  . Hyperlipidemia 12/11/2003    Class: Chronic    Past Surgical History:  Procedure Laterality Date  . ABDOMINAL HYSTERECTOMY  1986   Heavy bleeding  . BREAST BIOPSY  12/01/2007   Left axillary sentinal node bx : positive  . BREAST SURGERY  11/08/2007   Left partial mastectomy (lumpectomy)  . COLONOSCOPY  2010    OB History    No data available       Home Medications    Prior to Admission medications   Medication Sig Start Date End Date Taking? Authorizing  Provider  acetaminophen (TYLENOL) 325 MG tablet Take 2 tablets (650 mg total) by mouth every 6 (six) hours as needed. Do not take more than 4000mg  of tylenol per day 04/20/17  Yes Couture, Cortni S, PA-C  amLODipine (NORVASC) 10 MG tablet Take 1 tablet (10 mg total) by mouth daily. 03/22/16  Yes Sagardia, Ines Bloomer, MD  anastrozole (ARIMIDEX) 1 MG tablet TAKE 1 TABLET (1 MG TOTAL) BY MOUTH DAILY. 02/06/17  Yes Nicholas Lose, MD  aspirin EC 325 MG tablet Take 1 tablet (325 mg total) by mouth daily. 11/09/13  Yes Barton Fanny, MD  calcium-vitamin D (OSCAL WITH D) 500-200 MG-UNIT per tablet Take 1 tablet by mouth 2 (two) times daily.   Yes [provider]  metFORMIN (GLUCOPHAGE) 500 MG tablet Take 500 mg by mouth 2 (two) times daily. 03/24/17  Yes [provider]  pravastatin (PRAVACHOL) 40 MG tablet Take 1 tablet (40 mg total) by mouth daily. 12/13/15  Yes Elby Beck, FNP  ibuprofen (ADVIL,MOTRIN) 600 MG tablet Take 1 tablet (600 mg total) by mouth every 6 (six) hours as needed. Patient taking differently: Take 600 mg by mouth every 6 (six) hours as needed for moderate pain.  04/20/17   Couture, Cortni S, PA-C  metFORMIN (GLUCOPHAGE) 1000 MG tablet TAKE 1/2 TABLET BY MOUTH 2 TIMES DAILY WITH A MEAL Patient not taking: Reported on 04/22/2017  03/22/16   Horald Pollen, MD  oxyCODONE-acetaminophen (PERCOCET) 5-325 MG tablet Take 1 tablet by mouth every 4 (four) hours as needed. 04/22/17   Nat Christen, MD  predniSONE (DELTASONE) 10 MG tablet Take 1 tablet (10 mg total) by mouth daily with breakfast. 3 tablets for 3 days, 2 tablets for 3 days, 1 tablet for 3 days. 04/22/17   Nat Christen, MD    Family History Family History  Problem Relation Age of Onset  . Cancer Father        Died at 66 of Liver CA  . Diabetes Father   . Colon cancer Neg Hx     Social History Social History   Tobacco Use  . Smoking status: Former Smoker    Types: Cigarettes    Last attempt  to quit: 02/25/2007    Years since quitting: 10.1  . Smokeless tobacco: Never Used  Substance Use Topics  . Alcohol use: No  . Drug use: No     Allergies   Ace inhibitors and Simvastatin   Review of Systems Review of Systems  All other systems reviewed and are negative.    Physical Exam Updated Vital Signs BP (!) 166/69 (BP Location: Right Arm)   Pulse 76   Temp 98.5 F (36.9 C) (Oral)   Resp 20   SpO2 98%   Physical Exam  Constitutional: She is oriented to person, place, and time. She appears well-developed and well-nourished.  HENT:  Head: Normocephalic and atraumatic.  Eyes: Conjunctivae are normal.  Neck: Neck supple.  Cardiovascular: Normal rate and regular rhythm.  Pulmonary/Chest: Effort normal and breath sounds normal.  Abdominal: Soft. Bowel sounds are normal.  Minimal tenderness in the left lower quadrant  Musculoskeletal: Normal range of motion.  Neurological: She is alert and oriented to person, place, and time.  Skin: Skin is warm and dry.  Psychiatric: She has a normal mood and affect. Her behavior is normal.  Nursing note and vitals reviewed.  Musculoskeletal: Minimal tenderness in the left lower back.  Pain with straight leg raise left greater than right.  ED Treatments / Results  Labs (all labs ordered are listed, but only abnormal results are displayed) Labs Reviewed  COMPREHENSIVE METABOLIC PANEL - Abnormal; Notable for the following components:      Result Value   Potassium 3.4 (*)    Total Protein 8.6 (*)    ALT 13 (*)    All other components within normal limits  CBC - Abnormal; Notable for the following components:   Platelets 485 (*)    All other components within normal limits  URINALYSIS, ROUTINE W REFLEX MICROSCOPIC - Abnormal; Notable for the following components:   Color, Urine STRAW (*)    Hgb urine dipstick SMALL (*)    Squamous Epithelial / LPF 0-5 (*)    All other components within normal limits  LIPASE, BLOOD    EKG   EKG Interpretation None       Radiology Ct Abdomen Pelvis W Contrast  Result Date: 04/22/2017 CLINICAL DATA:  Left buttock pain radiating to the anterior left thigh. Left lower quadrant abdominal pain. History of left breast cancer. EXAM: CT ABDOMEN AND PELVIS WITH CONTRAST TECHNIQUE: Multidetector CT imaging of the abdomen and pelvis was performed using the standard protocol following bolus administration of intravenous contrast. CONTRAST:  165mL ISOVUE-300 IOPAMIDOL (ISOVUE-300) INJECTION 61% COMPARISON:  None. FINDINGS: Lower chest: 1.3 cm oval, circumscribed mass in the upper-outer retroareolar left breast, 1.1 cm oval mass-like density in  the medial left breast slightly superiorly, 1.0 cm oval, circumscribed mass in the inferior periareolar left breast and this 0.7 cm oval nodular density in the lower outer left breast. There is also a 0.8 cm oval mass-like density in the lower outer right breast and 1.8 cm oval mass-like density in the 12 o'clock retroareolar right breast. The patient had a bilateral screening mammogram on 10/14/2016 that demonstrated no interval changes suspicious for malignancy. Hepatobiliary: Multiple liver cysts.  Normal appearing gallbladder. Pancreas: Unremarkable. No pancreatic ductal dilatation or surrounding inflammatory changes. Spleen: Normal in size without focal abnormality. Adrenals/Urinary Tract: Adrenal glands are unremarkable. Kidneys are normal, without renal calculi, focal lesion, or hydronephrosis. Bladder is unremarkable. Stomach/Bowel: Multiple colonic diverticula without evidence of diverticulitis. Unremarkable stomach and small bowel. No evidence of appendicitis. Vascular/Lymphatic: Atheromatous arterial calcifications without aneurysm. No enlarged lymph nodes. Reproductive: Status post hysterectomy. No adnexal masses. Other: Small umbilical hernia containing fat. Musculoskeletal: Lower thoracic spine degenerative changes. Moderate right hip degenerative  changes. IMPRESSION: 1. No acute abnormality. 2. Probably benign bilateral breast masses. Correlation with the patient's previous mammograms is recommended. 3. Colonic diverticulosis. Electronically Signed   By: Claudie Revering M.D.   On: 04/22/2017 21:09    Procedures Procedures (including critical care time)  Medications Ordered in ED Medications  ondansetron [facility]) injection 4 mg (4 mg Intravenous Given 04/22/17 2022)  morphine 4 MG/ML injection 4 mg (4 mg Intravenous Given 04/22/17 2022)  iopamidol (ISOVUE-300) 61 % injection (100 mLs Intravenous Contrast Given 04/22/17 2038)     Initial Impression / Assessment and Plan / ED Course  I have reviewed the triage vital signs and the nursing notes.  Pertinent labs & imaging results that were available during my care of the patient were reviewed by me and considered in my medical decision making (see chart for details).     History and physical most consistent with sciatica pain.  However, there was a possibility that this could be a kidney stone versus diverticulitis.  CT scan of abdomen/pelvis showed no acute findings in the abdomen.  She does have bilateral breast masses which are thought to be benign.  Urinalysis shows no obvious infection.  All these findings were discussed with the patient.  Discharge medications Percocet and prednisone.  She will follow-up with her primary care doctor.  Final Clinical Impressions(s) / ED Diagnoses   Final diagnoses:  Sciatica of left side    ED Discharge Orders        Ordered    oxyCODONE-acetaminophen (PERCOCET) 5-325 MG tablet  Every 4 hours PRN     04/22/17 2355    predniSONE (DELTASONE) 10 MG tablet  Daily with breakfast     04/22/17 2355       Nat Christen, MD 04/23/17 0001

## 2017-04-22 NOTE — ED Triage Notes (Signed)
Patient c/o lower abdominal pain radiating into her leg. Reports pain first started on her back but has progressed.

## 2017-04-23 MED ORDER — OXYCODONE-ACETAMINOPHEN 5-325 MG PO TABS
2.0000 | ORAL_TABLET | Freq: Once | ORAL | Status: AC
  Administered 2017-04-23: 2 via ORAL
  Filled 2017-04-23: qty 2

## 2017-05-01 ENCOUNTER — Ambulatory Visit (HOSPITAL_COMMUNITY)
Admission: EM | Admit: 2017-05-01 | Discharge: 2017-05-01 | Disposition: A | Discharge status: Home / Self Care | Payer: Medicare HMO | Attending: Internal Medicine | Admitting: Internal Medicine

## 2017-05-01 ENCOUNTER — Other Ambulatory Visit: Payer: Self-pay

## 2017-05-01 ENCOUNTER — Encounter (HOSPITAL_COMMUNITY): Payer: Self-pay | Admitting: Emergency Medicine

## 2017-05-01 DIAGNOSIS — M5432 Sciatica, left side: Secondary | ICD-10-CM

## 2017-05-01 DIAGNOSIS — R1032 Left lower quadrant pain: Secondary | ICD-10-CM | POA: Diagnosis not present

## 2017-05-01 LAB — POCT URINALYSIS DIP (DEVICE)
Bilirubin Urine: NEGATIVE
GLUCOSE, UA: NEGATIVE mg/dL
Ketones, ur: NEGATIVE mg/dL
Leukocytes, UA: NEGATIVE
NITRITE: NEGATIVE
Protein, ur: NEGATIVE mg/dL
SPECIFIC GRAVITY, URINE: 1.01 (ref 1.005–1.030)
UROBILINOGEN UA: 0.2 mg/dL (ref 0.0–1.0)
pH: 7.5 (ref 5.0–8.0)

## 2017-05-01 MED ORDER — METHYLPREDNISOLONE SODIUM SUCC 125 MG IJ SOLR
INTRAMUSCULAR | Status: AC
Start: 2017-05-01 — End: 2017-05-01
  Filled 2017-05-01: qty 2

## 2017-05-01 MED ORDER — KETOROLAC TROMETHAMINE 60 MG/2ML IM SOLN
INTRAMUSCULAR | Status: AC
  Filled 2017-05-01: qty 2

## 2017-05-01 MED ORDER — NAPROXEN 500 MG PO TABS: 500.0000 mg | ORAL_TABLET | Freq: Two times a day (BID) | ORAL | 0 refills | Status: DC

## 2017-05-01 MED ORDER — KETOROLAC TROMETHAMINE 60 MG/2ML IM SOLN
60.0000 mg | Freq: Once | INTRAMUSCULAR | Status: AC
  Administered 2017-05-01: 60 mg via INTRAMUSCULAR

## 2017-05-01 MED ORDER — METHYLPREDNISOLONE SODIUM SUCC 125 MG IJ SOLR
125.0000 mg | Freq: Once | INTRAMUSCULAR | Status: AC
  Administered 2017-05-01: 125 mg via INTRAMUSCULAR

## 2017-05-01 MED ORDER — HYDROCODONE-ACETAMINOPHEN 5-325 MG PO TABS: 1.0000 | ORAL_TABLET | Freq: Four times a day (QID) | ORAL | 0 refills | Status: DC | PRN

## 2017-05-01 NOTE — ED Triage Notes (Signed)
The patient presented to the [facility] with a complaint of lower left abdominal pain that radiates into her left leg when she walks x 3 weeks. The patient reported that she was evaluated at the ED and diagnosed with sciatica.

## 2017-05-01 NOTE — ED Provider Notes (Addendum)
[facility]    CSN: [id number] Arrival date & time: 05/01/17  1555     History   Chief Complaint Chief Complaint  Patient presents with  . Abdominal Pain    HPI Ellen [name] is a 67 y.o. female.   She presents to the urgent care today with left lower quadrant pain radiating into the medial left thigh, diagnosed in the ED after extensive workup with left sciatica.  She has been to the emergency room twice, on 2/25 and 2/27. Abdominal workup was negative.  Pain quality and pain worsening with activity/movement are consistent with sciatica.  Frustrated by persistent significant pain.  Was able to walk into the urgent care independently, but it hurts.    HPI  Past Medical History:  Diagnosis Date  . Breast CA ([place]) 10/2007   S/P Surgery and Radiation Tx completed 03/20/08  . HTN (hypertension)   . Hyperlipidemia   . Personal history of radiation therapy 2009  . Thyroid goiter 03/2010   FNA of Left Thyroid Nodule - 05/14/2010    Patient Active Problem List   Diagnosis Date Noted  . Malignant neoplasm of overlapping sites of left female breast ([place]) 06/29/2015  . Dry eye syndrome 06/01/2014  . Eye disease 06/01/2014  . Goiter 04/28/2013  . History of tobacco use 11/05/2011  . Breast cancer of upper-outer quadrant of left female breast ([place]) 11/04/2011  . Obesity (BMI 30-39.9) 04/03/2011  . HTN (hypertension)   . COLONIC POLYPS, ADENOMATOUS, HX OF 05/02/2008  . Hyperlipidemia 12/11/2003    Class: Chronic    Past Surgical History:  Procedure Laterality Date  . ABDOMINAL HYSTERECTOMY  1986   Heavy bleeding  . BREAST BIOPSY  12/01/2007   Left axillary sentinal node bx : positive  . BREAST SURGERY  11/08/2007   Left partial mastectomy (lumpectomy)  . COLONOSCOPY  2010     Home Medications    Prior to Admission medications   Medication Sig Start Date End Date Taking? Authorizing Provider  acetaminophen (TYLENOL) 325 MG tablet Take 2 tablets (650 mg  total) by mouth every 6 (six) hours as needed. Do not take more than 4000mg  of tylenol per day 04/20/17   Couture, Cortni S, PA-C  amLODipine (NORVASC) 10 MG tablet Take 1 tablet (10 mg total) by mouth daily. 03/22/16   Horald Pollen, MD  anastrozole (ARIMIDEX) 1 MG tablet TAKE 1 TABLET (1 MG TOTAL) BY MOUTH DAILY. 02/06/17   Nicholas Lose, MD  aspirin EC 325 MG tablet Take 1 tablet (325 mg total) by mouth daily. 11/09/13   Barton Fanny, MD  calcium-vitamin D (OSCAL WITH D) 500-200 MG-UNIT per tablet Take 1 tablet by mouth 2 (two) times daily.    [provider]  HYDROcodone-acetaminophen (NORCO/VICODIN) 5-325 MG tablet Take 1 tablet by mouth 4 (four) times daily as needed. 05/01/17   Wynona Luna, MD  metFORMIN (GLUCOPHAGE) 500 MG tablet Take 500 mg by mouth 2 (two) times daily. 03/24/17   [provider]  naproxen (NAPROSYN) 500 MG tablet Take 1 tablet (500 mg total) by mouth 2 (two) times daily. 05/01/17   Wynona Luna, MD  oxyCODONE-acetaminophen (PERCOCET) 5-325 MG tablet Take 1 tablet by mouth every 4 (four) hours as needed. 04/22/17   Nat Christen, MD  pravastatin (PRAVACHOL) 40 MG tablet Take 1 tablet (40 mg total) by mouth daily. 12/13/15   Elby Beck, FNP  predniSONE (DELTASONE) 10 MG tablet Take 1 tablet (10 mg total)  by mouth daily with breakfast. 3 tablets for 3 days, 2 tablets for 3 days, 1 tablet for 3 days. 04/22/17   Nat Christen, MD    Family History Family History  Problem Relation Age of Onset  . Cancer Father        Died at 57 of Liver CA  . Diabetes Father   . Colon cancer Neg Hx     Social History Social History   Tobacco Use  . Smoking status: Former Smoker    Types: Cigarettes    Last attempt to quit: 02/25/2007    Years since quitting: 10.1  . Smokeless tobacco: Never Used  Substance Use Topics  . Alcohol use: No  . Drug use: No     Allergies   Ace inhibitors and Simvastatin   Review of Systems Review of  Systems  All other systems reviewed and are negative.    Physical Exam Triage Vital Signs ED Triage Vitals  Enc Vitals Group     BP 05/01/17 1654 (!) 153/85     Pulse Rate 05/01/17 1654 68     Resp 05/01/17 1654 18     Temp 05/01/17 1654 98.5 F (36.9 C)     Temp Source 05/01/17 1654 Oral     SpO2 05/01/17 1654 100 %     Weight --      Height --      Pain Score 05/01/17 1652 8     Pain Loc --    Updated Vital Signs BP (!) 153/85 (BP Location: Right Arm)   Pulse 68   Temp 98.5 F (36.9 C) (Oral)   Resp 18   SpO2 100%   Physical Exam  Constitutional: She is oriented to person, place, and time. No distress.  HENT:  Head: Atraumatic.  Eyes:  Conjugate gaze observed, no eye redness/discharge  Neck: Neck supple.  Cardiovascular: Normal rate and regular rhythm.  Pulmonary/Chest: No respiratory distress. She has no wheezes. She has no rales.  Lungs clear, symmetric breath sounds   Abdominal: Soft. She exhibits no distension. There is no tenderness. There is no rebound and no guarding.  Musculoskeletal: Normal range of motion.  Able to climb on and off of the exam table, ambulate in room, moves a little stiffly  Neurological: She is alert and oriented to person, place, and time.  Skin: Skin is warm and dry.  Nursing note and vitals reviewed.    UC Treatments / Results  Labs Results for orders placed or performed during the hospital encounter of 05/01/17  POCT urinalysis dip (device)  Result Value Ref Range   Glucose, UA NEGATIVE NEGATIVE mg/dL   Bilirubin Urine NEGATIVE NEGATIVE   Ketones, ur NEGATIVE NEGATIVE mg/dL   Specific Gravity, Urine 1.010 1.005 - 1.030   Hgb urine dipstick TRACE (A) NEGATIVE   pH 7.5 5.0 - 8.0   Protein, ur NEGATIVE NEGATIVE mg/dL   Urobilinogen, UA 0.2 0.0 - 1.0 mg/dL   Nitrite NEGATIVE NEGATIVE   Leukocytes, UA NEGATIVE NEGATIVE    Procedures Procedures (including critical care time)  Medications Ordered in UC Medications    ketorolac (TORADOL) injection 60 mg (60 mg Intramuscular Given 05/01/17 1747)  methylPREDNISolone sodium succinate (SOLU-MEDROL) 125 mg/2 mL injection 125 mg (125 mg Intramuscular Given 05/01/17 1749)    Final Clinical Impressions(s) / UC Diagnoses   Final diagnoses:  Left sided sciatica   No danger signs on exam today.  Anticipate slow, gradual improvement in pain over the next several weeks.  Ice for 5-10 minutes several times daily may help decrease discomfort.  Physical therapy is also sometimes helpful in managing pain while sciatica subsides.  Prescriptions for Naprosyn (anti-inflammatory/pain reliever) and a small number of Vicodin (pain medicine) were sent to the pharmacy.  Please use Vicodin sparingly, for emergency pain.  ED Discharge Orders        Ordered    naproxen (NAPROSYN) 500 MG tablet  2 times daily     05/01/17 1806    HYDROcodone-acetaminophen (NORCO/VICODIN) 5-325 MG tablet  4 times daily PRN     05/01/17 1806      Consulted the [place] pain med registry and feel it is appropriate to proceed with this rx for controlled substance.     Wynona Luna, MD 05/03/17 2057

## 2017-05-01 NOTE — Discharge Instructions (Addendum)
No danger signs on exam today.  Anticipate slow, gradual improvement in pain over the next several weeks.  Ice for 5-10 minutes several times daily may help decrease discomfort.  Physical therapy is also sometimes helpful in managing pain while sciatica subsides.  Prescriptions for Naprosyn (anti-inflammatory/pain reliever) and a small number of Vicodin (pain medicine) were sent to the pharmacy.  Please use Vicodin sparingly, for emergency pain.

## 2017-05-09 ENCOUNTER — Encounter (HOSPITAL_COMMUNITY): Payer: Self-pay | Admitting: Radiology

## 2017-05-09 ENCOUNTER — Observation Stay (HOSPITAL_COMMUNITY)
Admission: EM | Admit: 2017-05-09 | Discharge: 2017-05-11 | Disposition: A | Discharge status: Home / Self Care | Payer: Medicare HMO | Attending: Internal Medicine | Admitting: Internal Medicine

## 2017-05-09 ENCOUNTER — Other Ambulatory Visit: Payer: Self-pay

## 2017-05-09 ENCOUNTER — Emergency Department (HOSPITAL_COMMUNITY): Payer: Medicare HMO

## 2017-05-09 DIAGNOSIS — M549 Dorsalgia, unspecified: Secondary | ICD-10-CM | POA: Diagnosis not present

## 2017-05-09 DIAGNOSIS — I10 Essential (primary) hypertension: Secondary | ICD-10-CM | POA: Diagnosis not present

## 2017-05-09 DIAGNOSIS — K5909 Other constipation: Secondary | ICD-10-CM | POA: Diagnosis not present

## 2017-05-09 DIAGNOSIS — N179 Acute kidney failure, unspecified: Secondary | ICD-10-CM | POA: Diagnosis not present

## 2017-05-09 DIAGNOSIS — Z79899 Other long term (current) drug therapy: Secondary | ICD-10-CM | POA: Diagnosis not present

## 2017-05-09 DIAGNOSIS — Z7982 Long term (current) use of aspirin: Secondary | ICD-10-CM | POA: Insufficient documentation

## 2017-05-09 DIAGNOSIS — D649 Anemia, unspecified: Secondary | ICD-10-CM | POA: Diagnosis not present

## 2017-05-09 DIAGNOSIS — M5432 Sciatica, left side: Secondary | ICD-10-CM

## 2017-05-09 DIAGNOSIS — E861 Hypovolemia: Secondary | ICD-10-CM | POA: Diagnosis not present

## 2017-05-09 DIAGNOSIS — I959 Hypotension, unspecified: Secondary | ICD-10-CM | POA: Diagnosis not present

## 2017-05-09 DIAGNOSIS — E785 Hyperlipidemia, unspecified: Secondary | ICD-10-CM | POA: Diagnosis not present

## 2017-05-09 DIAGNOSIS — I700 Atherosclerosis of aorta: Secondary | ICD-10-CM | POA: Insufficient documentation

## 2017-05-09 DIAGNOSIS — Z923 Personal history of irradiation: Secondary | ICD-10-CM | POA: Insufficient documentation

## 2017-05-09 DIAGNOSIS — Z86010 Personal history of colonic polyps: Secondary | ICD-10-CM | POA: Diagnosis not present

## 2017-05-09 DIAGNOSIS — M79652 Pain in left thigh: Secondary | ICD-10-CM | POA: Diagnosis not present

## 2017-05-09 DIAGNOSIS — D62 Acute posthemorrhagic anemia: Secondary | ICD-10-CM | POA: Insufficient documentation

## 2017-05-09 DIAGNOSIS — Z79811 Long term (current) use of aromatase inhibitors: Secondary | ICD-10-CM | POA: Insufficient documentation

## 2017-05-09 DIAGNOSIS — K921 Melena: Principal | ICD-10-CM | POA: Insufficient documentation

## 2017-05-09 DIAGNOSIS — K922 Gastrointestinal hemorrhage, unspecified: Secondary | ICD-10-CM | POA: Diagnosis not present

## 2017-05-09 DIAGNOSIS — Z7984 Long term (current) use of oral hypoglycemic drugs: Secondary | ICD-10-CM | POA: Diagnosis not present

## 2017-05-09 DIAGNOSIS — C50812 Malignant neoplasm of overlapping sites of left female breast: Secondary | ICD-10-CM | POA: Diagnosis present

## 2017-05-09 DIAGNOSIS — K625 Hemorrhage of anus and rectum: Secondary | ICD-10-CM | POA: Diagnosis present

## 2017-05-09 DIAGNOSIS — Z853 Personal history of malignant neoplasm of breast: Secondary | ICD-10-CM | POA: Insufficient documentation

## 2017-05-09 DIAGNOSIS — Z87891 Personal history of nicotine dependence: Secondary | ICD-10-CM | POA: Insufficient documentation

## 2017-05-09 LAB — COMPREHENSIVE METABOLIC PANEL
ALBUMIN: 3.7 g/dL (ref 3.5–5.0)
ALT: 9 U/L — AB (ref 14–54)
AST: 23 U/L (ref 15–41)
Alkaline Phosphatase: 42 U/L (ref 38–126)
Anion gap: 12 (ref 5–15)
BUN: 17 mg/dL (ref 6–20)
CHLORIDE: 103 mmol/L (ref 101–111)
CO2: 24 mmol/L (ref 22–32)
CREATININE: 1.15 mg/dL — AB (ref 0.44–1.00)
Calcium: 8.3 mg/dL — ABNORMAL LOW (ref 8.9–10.3)
GFR calc Af Amer: 57 mL/min — ABNORMAL LOW (ref >60)
GFR, EST NON AFRICAN AMERICAN: 49 mL/min — AB (ref >60)
GLUCOSE: 175 mg/dL — AB (ref 65–99)
POTASSIUM: 4.4 mmol/L (ref 3.5–5.1)
SODIUM: 139 mmol/L (ref 135–145)
Total Bilirubin: 0.3 mg/dL (ref 0.3–1.2)
Total Protein: 6.8 g/dL (ref 6.5–8.1)

## 2017-05-09 LAB — CBC WITH DIFFERENTIAL/PLATELET
Basophils Absolute: 0 10*3/uL (ref 0.0–0.1)
Basophils Relative: 0 %
EOS ABS: 0.1 10*3/uL (ref 0.0–0.7)
EOS PCT: 2 %
HCT: 35.9 % — ABNORMAL LOW (ref 36.0–46.0)
Hemoglobin: 11.5 g/dL — ABNORMAL LOW (ref 12.0–15.0)
LYMPHS ABS: 2.8 10*3/uL (ref 0.7–4.0)
Lymphocytes Relative: 34 %
MCH: 28 pg (ref 26.0–34.0)
MCHC: 32 g/dL (ref 30.0–36.0)
MCV: 87.6 fL (ref 78.0–100.0)
MONOS PCT: 11 %
Monocytes Absolute: 0.9 10*3/uL (ref 0.1–1.0)
Neutro Abs: 4.5 10*3/uL (ref 1.7–7.7)
Neutrophils Relative %: 53 %
PLATELETS: 333 10*3/uL (ref 150–400)
RBC: 4.1 MIL/uL (ref 3.87–5.11)
RDW: 14.4 % (ref 11.5–15.5)
WBC: 8.3 10*3/uL (ref 4.0–10.5)

## 2017-05-09 LAB — I-STAT CHEM 8, ED
BUN: 18 mg/dL (ref 6–20)
CREATININE: 0.9 mg/dL (ref 0.44–1.00)
Calcium, Ion: 1 mmol/L — ABNORMAL LOW (ref 1.15–1.40)
Chloride: 104 mmol/L (ref 101–111)
Glucose, Bld: 176 mg/dL — ABNORMAL HIGH (ref 65–99)
HEMATOCRIT: 37 % (ref 36.0–46.0)
HEMOGLOBIN: 12.6 g/dL (ref 12.0–15.0)
POTASSIUM: 3.6 mmol/L (ref 3.5–5.1)
Sodium: 142 mmol/L (ref 135–145)
TCO2: 26 mmol/L (ref 22–32)

## 2017-05-09 LAB — HEMOGLOBIN AND HEMATOCRIT, BLOOD
HCT: 31.9 % — ABNORMAL LOW (ref 36.0–46.0)
HEMOGLOBIN: 10.1 g/dL — AB (ref 12.0–15.0)

## 2017-05-09 LAB — POC OCCULT BLOOD, ED: FECAL OCCULT BLD: POSITIVE — AB

## 2017-05-09 LAB — PROTIME-INR
INR: 1
Prothrombin Time: 13.1 seconds (ref 11.4–15.2)

## 2017-05-09 MED ORDER — GABAPENTIN 300 MG PO CAPS
300.0000 mg | ORAL_CAPSULE | Freq: Once | ORAL | Status: AC
Start: 2017-05-09 — End: 2017-05-09
  Administered 2017-05-09: 300 mg via ORAL
  Filled 2017-05-09: qty 1

## 2017-05-09 MED ORDER — IOPAMIDOL (ISOVUE-300) INJECTION 61%
100.0000 mL | Freq: Once | INTRAVENOUS | Status: AC | PRN
  Administered 2017-05-09: 100 mL via INTRAVENOUS

## 2017-05-09 MED ORDER — MORPHINE SULFATE (PF) 4 MG/ML IV SOLN
1.0000 mg | INTRAVENOUS | Status: DC | PRN
  Administered 2017-05-09 – 2017-05-10 (×3): 2 mg via INTRAVENOUS
  Filled 2017-05-09 (×3): qty 1

## 2017-05-09 MED ORDER — GABAPENTIN 600 MG PO TABS
300.0000 mg | ORAL_TABLET | Freq: Once | ORAL | Status: DC
  Filled 2017-05-09: qty 0.5

## 2017-05-09 MED ORDER — SODIUM CHLORIDE 0.9 % IV BOLUS (SEPSIS)
1000.0000 mL | Freq: Once | INTRAVENOUS | Status: AC
  Administered 2017-05-09: 1000 mL via INTRAVENOUS

## 2017-05-09 MED ORDER — ONDANSETRON HCL 4 MG/2ML IJ SOLN
4.0000 mg | Freq: Four times a day (QID) | INTRAMUSCULAR | Status: DC | PRN
  Administered 2017-05-09: 4 mg via INTRAVENOUS
  Filled 2017-05-09: qty 2

## 2017-05-09 MED ORDER — PANTOPRAZOLE SODIUM 40 MG IV SOLR
40.0000 mg | Freq: Two times a day (BID) | INTRAVENOUS | Status: DC
  Administered 2017-05-09 – 2017-05-11 (×5): 40 mg via INTRAVENOUS
  Filled 2017-05-09 (×5): qty 40

## 2017-05-09 MED ORDER — MORPHINE SULFATE (PF) 2 MG/ML IV SOLN
1.0000 mg | INTRAVENOUS | Status: DC | PRN
  Administered 2017-05-09: 2 mg via INTRAVENOUS
  Filled 2017-05-09: qty 1

## 2017-05-09 MED ORDER — SODIUM CHLORIDE 0.9 % IV SOLN
INTRAVENOUS | Status: DC
  Administered 2017-05-09 – 2017-05-10 (×4): via INTRAVENOUS

## 2017-05-09 MED ORDER — IOPAMIDOL (ISOVUE-300) INJECTION 61%
INTRAVENOUS | Status: AC
  Filled 2017-05-09: qty 100

## 2017-05-09 NOTE — Plan of Care (Signed)
  Progressing Education: Knowledge of General Education information will improve 05/09/2017 1815 - Progressing by Rubbie Goostree, Royetta Crochet, RN

## 2017-05-09 NOTE — Consult Note (Signed)
Consultation  Referring Provider: [facility] Primary Care Physician:  Horald Pollen, MD Primary Gastroenterologist:  Dr.Jacobs  Reason for Consultation:  GI bleed  HPI: Ellen [name] is a 67 y.o. female , known to Dr. Ardis Hughs, with history of adenomatous colon polyps, who is admitted through the emergency room this morning. Patient says she had become constipated because of pain medication she was taking for sciatica. She took a stool softener couple of days ago, had a lot of straining but no bowel movement. She then drank about half a bottle of mag citrate and last evening at about 11:30 she did have a bowel movement and passed a lot of normal-appearing stool. She went to bed and about 5:15 this morning had urge for bowel movement, and then passed a lot of red blood in the commode and some stool. She did not have any significant abdominal pain and cramping diaphoresis nausea or vomiting. No syncope. She came to the emergency room, was initially hypotensive. She was given 2 L of fluid bolus and responded well.Initial hemoglobin was 12.6. She has had 2 more bloody bowel movements since arrival to the ER both of those were early this morning and has had no bleeding over the past 5 hours. She feels fine at present. Patient has not had any prior history of GI bleeding. She has been taking aspirin 325 mg daily, recently took a steroid Dosepak, had been on Vicodin for sciatica but does not believe she was given any prescription for NSAIDs. Last colonoscopy May 2015 for history of adenomatous polyps. Patient had 2 small polyps removed which both showed benign colonic mucosa no adenomatous change and was noted to have a few left colon diverticuli, no internal hemorrhoids. Patient has had 3 ER visits in the past couple of weeks all because of the left-sided back and anterior thigh pain felt to be consistent with sciatica.   Past Medical History:  Diagnosis Date  . Breast CA ([place]) 10/2007   S/P  Surgery and Radiation Tx completed 03/20/08  . HTN (hypertension)   . Hyperlipidemia   . Personal history of radiation therapy 2009  . Thyroid goiter 03/2010   FNA of Left Thyroid Nodule - 05/14/2010    Past Surgical History:  Procedure Laterality Date  . ABDOMINAL HYSTERECTOMY  1986   Heavy bleeding  . BREAST BIOPSY  12/01/2007   Left axillary sentinal node bx : positive  . BREAST SURGERY  11/08/2007   Left partial mastectomy (lumpectomy)  . COLONOSCOPY  2010    Prior to Admission medications   Medication Sig Start Date End Date Taking? Authorizing Provider  amLODipine (NORVASC) 10 MG tablet Take 1 tablet (10 mg total) by mouth daily. 03/22/16  Yes Sagardia, Ines Bloomer, MD  anastrozole (ARIMIDEX) 1 MG tablet TAKE 1 TABLET (1 MG TOTAL) BY MOUTH DAILY. 02/06/17  Yes Nicholas Lose, MD  aspirin EC 325 MG tablet Take 1 tablet (325 mg total) by mouth daily. 11/09/13  Yes Barton Fanny, MD  calcium-vitamin D (OSCAL WITH D) 500-200 MG-UNIT per tablet Take 1 tablet by mouth 2 (two) times daily.   Yes [provider]  HYDROcodone-acetaminophen (NORCO/VICODIN) 5-325 MG tablet Take 1 tablet by mouth 4 (four) times daily as needed. Patient taking differently: Take 1 tablet by mouth 4 (four) times daily as needed for moderate pain.  05/01/17  Yes Wynona Luna, MD  metFORMIN (GLUCOPHAGE) 500 MG tablet Take 500 mg by mouth 2 (two) times daily. 03/24/17  Yes  [provider]  naproxen (NAPROSYN) 500 MG tablet Take 1 tablet (500 mg total) by mouth 2 (two) times daily. 05/01/17  Yes Wynona Luna, MD  pravastatin (PRAVACHOL) 40 MG tablet Take 1 tablet (40 mg total) by mouth daily. 12/13/15  Yes Elby Beck, FNP  acetaminophen (TYLENOL) 325 MG tablet Take 2 tablets (650 mg total) by mouth every 6 (six) hours as needed. Do not take more than 4000mg  of tylenol per day Patient not taking: Reported on 05/09/2017 04/20/17   Couture, Cortni S, PA-C    oxyCODONE-acetaminophen (PERCOCET) 5-325 MG tablet Take 1 tablet by mouth every 4 (four) hours as needed. Patient not taking: Reported on 05/09/2017 04/22/17   Nat Christen, MD  predniSONE (DELTASONE) 10 MG tablet Take 1 tablet (10 mg total) by mouth daily with breakfast. 3 tablets for 3 days, 2 tablets for 3 days, 1 tablet for 3 days. Patient not taking: Reported on 05/09/2017 04/22/17   Nat Christen, MD    Current Facility-Administered Medications  Medication Dose Route Frequency Provider Last Rate Last Dose  . 0.9 %  sodium chloride infusion   Intravenous Continuous Palumbo, April, MD 125 mL/hr at 05/09/17 0736    . gabapentin (NEURONTIN) tablet 300 mg  300 mg Oral Once Recardo Evangelist, PA-C      . iopamidol (ISOVUE-300) 61 % injection           . morphine 2 MG/ML injection 1-2 mg  1-2 mg Intravenous Q4H PRN Hosie Poisson, MD      . ondansetron (ZOFRAN) injection 4 mg  4 mg Intravenous Q6H PRN Hosie Poisson, MD      . pantoprazole (PROTONIX) injection 40 mg  40 mg Intravenous Q12H Hosie Poisson, MD       Current Outpatient Medications  Medication Sig Dispense Refill  . amLODipine (NORVASC) 10 MG tablet Take 1 tablet (10 mg total) by mouth daily. 90 tablet 3  . anastrozole (ARIMIDEX) 1 MG tablet TAKE 1 TABLET (1 MG TOTAL) BY MOUTH DAILY. 90 tablet 3  . aspirin EC 325 MG tablet Take 1 tablet (325 mg total) by mouth daily. 90 tablet 1  . calcium-vitamin D (OSCAL WITH D) 500-200 MG-UNIT per tablet Take 1 tablet by mouth 2 (two) times daily.    Marland Kitchen HYDROcodone-acetaminophen (NORCO/VICODIN) 5-325 MG tablet Take 1 tablet by mouth 4 (four) times daily as needed. (Patient taking differently: Take 1 tablet by mouth 4 (four) times daily as needed for moderate pain. ) 10 tablet 0  . metFORMIN (GLUCOPHAGE) 500 MG tablet Take 500 mg by mouth 2 (two) times daily.  2  . naproxen (NAPROSYN) 500 MG tablet Take 1 tablet (500 mg total) by mouth 2 (two) times daily. 30 tablet 0  . pravastatin (PRAVACHOL) 40 MG  tablet Take 1 tablet (40 mg total) by mouth daily. 90 tablet 1  . acetaminophen (TYLENOL) 325 MG tablet Take 2 tablets (650 mg total) by mouth every 6 (six) hours as needed. Do not take more than 4000mg  of tylenol per day (Patient not taking: Reported on 05/09/2017) 30 tablet 0  . oxyCODONE-acetaminophen (PERCOCET) 5-325 MG tablet Take 1 tablet by mouth every 4 (four) hours as needed. (Patient not taking: Reported on 05/09/2017) 20 tablet 0  . predniSONE (DELTASONE) 10 MG tablet Take 1 tablet (10 mg total) by mouth daily with breakfast. 3 tablets for 3 days, 2 tablets for 3 days, 1 tablet for 3 days. (Patient not taking: Reported on 05/09/2017) 18 tablet 0  Allergies as of 05/09/2017 - Review Complete 05/09/2017  Allergen Reaction Noted  . Ace inhibitors  04/18/2010  . Simvastatin Swelling 04/03/2011    Family History  Problem Relation Age of Onset  . Cancer Father        Died at 72 of Liver CA  . Diabetes Father   . Colon cancer Neg Hx     Social History   Socioeconomic History  . Marital status: Divorced    Spouse name: Not on file  . Number of children: Not on file  . Years of education: Not on file  . Highest education level: Not on file  Social Needs  . Financial resource strain: Not on file  . Food insecurity - worry: Not on file  . Food insecurity - inability: Not on file  . Transportation needs - medical: Not on file  . Transportation needs - non-medical: Not on file  Occupational History  . Occupation: Freight forwarder    Comment: Working 2 jobs.  Tobacco Use  . Smoking status: Former Smoker    Types: Cigarettes    Last attempt to quit: 02/25/2007    Years since quitting: 10.2  . Smokeless tobacco: Never Used  Substance and Sexual Activity  . Alcohol use: No  . Drug use: No  . Sexual activity: Not Currently  Other Topics Concern  . Not on file  Social History Narrative   Widowed. Education: [employer].    Review of Systems: Pertinent positive and negative  review of systems were noted in the above HPI section.  All other review of systems was otherwise negative.  Physical Exam: Vital signs in last 24 hours: Temp:  [98.2 F (36.8 C)] 98.2 F (36.8 C) (03/16 0639) Pulse Rate:  [86-113] 98 (03/16 1138) Resp:  [13-24] 20 (03/16 1138) BP: (60-132)/(40-84) 115/82 (03/16 1138) SpO2:  [94 %-100 %] 100 % (03/16 1138) Weight:  [143 lb 12.8 oz (65.2 kg)] 143 lb 12.8 oz (65.2 kg) (03/16 3664)   General:   Alert,  Well-developed, African-American femalewell-nourished, pleasant and cooperative in NAD Head:  Normocephalic and atraumatic. Eyes:  Sclera clear, no icterus.   Conjunctiva pink. Ears:  Normal auditory acuity. Nose:  No deformity, discharge,  or lesions. Mouth:  No deformity or lesions.   Neck:  Supple; no masses or thyromegaly. Lungs:  Clear throughout to auscultation.   No wheezes, crackles, or rhonchi. Heart:  Regular rate and rhythm; no murmurs, clicks, rubs,  or gallops. Abdomen:  Soft,nontender, BS active,nonpalp mass or hsm.   Rectal:  Not done Msk:  Symmetrical without gross deformities. . Pulses:  Normal pulses noted. Extremities:  Without clubbing or edema. Neurologic:  Alert and  oriented x4;  grossly normal neurologically. Skin:  Intact without significant lesions or rashes.. Psych:  Alert and cooperative. Normal mood and affect.  Intake/Output from previous day: No intake/output data recorded. Intake/Output this shift: No intake/output data recorded.  Lab Results: Recent Labs    05/09/17 0701 05/09/17 0711  WBC 8.3  --   HGB 11.5* 12.6  HCT 35.9* 37.0  PLT 333  --    BMET Recent Labs    05/09/17 0701 05/09/17 0711  NA 139 142  K 4.4 3.6  CL 103 104  CO2 24  --   GLUCOSE 175* 176*  BUN 17 18  CREATININE 1.15* 0.90  CALCIUM 8.3*  --    LFT Recent Labs    05/09/17 0701  PROT 6.8  ALBUMIN 3.7  AST 23  ALT 9*  ALKPHOS 42  BILITOT 0.3   PT/INR Recent Labs    05/09/17 0701  LABPROT 13.1  INR  1.00   Hepatitis Panel No results for input(s): HEPBSAG, HCVAB, HEPAIGM, HEPBIGM in the last 72 hours.  IMPRESSION:  #16 67 year old female admitted this morning after onset of acute bright red blood per rectum. Patient was hypotensive on arrival to the ER. Thus far she has had 3 episodes of grossly bloody stool, none over the past 5 hours. Suspect acute diverticular hemorrhage, perhaps aggravated by constipation, straining and laxatives. No bleeding over the past 5 hours which is encouraging, hopefully bleeding is resolving  #2 normocytic anemia secondary to acute blood loss #3 left back and thigh pain felt consistent with sciatica symptoms 1 month #4 history of adenomatous colon polyps-up-to-date with colonoscopy due to thousand 20 #5 history of hypertension #6 history of breast cancer2009  PLAN: #1 clear liquid diet #2 continue IV fluids #3 serial hemoglobins and transfuse for hemoglobin 8 or less #4 no plans for endoscopic intervention with colonoscopy at present. If patient has further active acute hemorrhage will need new med GI bleeding scan. Thank you will follow with you  [facility]  05/09/2017, 12:35 PM  GI ATTENDING  History, x-rays, laboratories, prior colonoscopy report reviewed. Agree with comprehensive consultation note as outlined above. Presentation most consistent with acute diverticular bleed. Agree with admission, close monitoring, and supportive care with recommendations as outlined above. We will Continue to follow. Thank you  Docia Chuck. Geri Seminole., M.D. [facility] Division of Gastroenterology

## 2017-05-09 NOTE — ED Triage Notes (Signed)
Pt from home c/o blood in her stool this am. Pt denies hx of the same. Pt sts that it just started today. Pt is A&O and in NAD. VSS at this time.

## 2017-05-09 NOTE — ED Notes (Signed)
ED TO INPATIENT HANDOFF REPORT  Name/Age/Gender Ellen [name] 67 y.o. female  Code Status Code Status History    This patient does not have a recorded code status. Please follow your organizational policy for patients in this situation.      Home/SNF/Other Home  Chief Complaint rectal bleeding   Level of Care/Admitting Diagnosis ED Disposition    ED Disposition Condition Comment   Admit  Hospital Area: [facility] [[id number]]  Level of Care: Telemetry [5]  Admit to tele based on following criteria: Other see comments  Comments: acute gi bleed  Diagnosis: GI bleed [299371]  Admitting Physician: Hosie Poisson [[id number]]  Attending Physician: Hosie Poisson [[id number]]  PT Class (Do Not Modify): Observation [104]  PT Acc Code (Do Not Modify): Observation [10022]       Medical History Past Medical History:  Diagnosis Date  . Breast CA ([place]) 10/2007   S/P Surgery and Radiation Tx completed 03/20/08  . HTN (hypertension)   . Hyperlipidemia   . Personal history of radiation therapy 2009  . Thyroid goiter 03/2010   FNA of Left Thyroid Nodule - 05/14/2010    Allergies Allergies  Allergen Reactions  . Ace Inhibitors   . Simvastatin Swelling    lips    IV Location/Drains/Wounds Patient Lines/Drains/Airways Status   Active Line/Drains/Airways    Name:   Placement date:   Placement time:   Site:   Days:   Peripheral IV 05/09/17 Right;Lateral Forearm   05/09/17    0702    Forearm   less than 1   Peripheral IV 05/09/17 Right;Anterior;Upper Forearm   05/09/17    0703    Forearm   less than 1          Labs/Imaging Results for orders placed or performed during the hospital encounter of 05/09/17 (from the past 48 hour(s))  Comprehensive metabolic panel     Status: Abnormal   Collection Time: 05/09/17  7:01 AM  Result Value Ref Range   Sodium 139 135 - 145 mmol/L   Potassium 4.4 3.5 - 5.1 mmol/L    Comment: HEMOLYSIS AT THIS LEVEL MAY AFFECT RESULT    Chloride 103 101 - 111 mmol/L   CO2 24 22 - 32 mmol/L   Glucose, Bld 175 (H) 65 - 99 mg/dL   BUN 17 6 - 20 mg/dL   Creatinine, Ser 1.15 (H) 0.44 - 1.00 mg/dL   Calcium 8.3 (L) 8.9 - 10.3 mg/dL   Total Protein 6.8 6.5 - 8.1 g/dL   Albumin 3.7 3.5 - 5.0 g/dL   AST 23 15 - 41 U/L   ALT 9 (L) 14 - 54 U/L   Alkaline Phosphatase 42 38 - 126 U/L   Total Bilirubin 0.3 0.3 - 1.2 mg/dL   GFR calc non Af Amer 49 (L) >60 mL/min   GFR calc Af Amer 57 (L) >60 mL/min    Comment: (NOTE) The eGFR has been calculated using the CKD EPI equation. This calculation has not been validated in all clinical situations. eGFR's persistently <60 mL/min signify possible Chronic Kidney Disease.    Anion gap 12 5 - 15    Comment: Performed at [facility], [place] [address] [place], [place] [address]  CBC WITH DIFFERENTIAL     Status: Abnormal   Collection Time: 05/09/17  7:01 AM  Result Value Ref Range   WBC 8.3 4.0 - 10.5 K/uL   RBC 4.10 3.87 - 5.11 MIL/uL   Hemoglobin 11.5 (L)  12.0 - 15.0 g/dL   HCT 35.9 (L) 36.0 - 46.0 %   MCV 87.6 78.0 - 100.0 fL   MCH 28.0 26.0 - 34.0 pg   MCHC 32.0 30.0 - 36.0 g/dL   RDW 14.4 11.5 - 15.5 %   Platelets 333 150 - 400 K/uL   Neutrophils Relative % 53 %   Neutro Abs 4.5 1.7 - 7.7 K/uL   Lymphocytes Relative 34 %   Lymphs Abs 2.8 0.7 - 4.0 K/uL   Monocytes Relative 11 %   Monocytes Absolute 0.9 0.1 - 1.0 K/uL   Eosinophils Relative 2 %   Eosinophils Absolute 0.1 0.0 - 0.7 K/uL   Basophils Relative 0 %   Basophils Absolute 0.0 0.0 - 0.1 K/uL    Comment: Performed at [facility], [place] [address], [place], [place] [address]  Protime-INR     Status: None   Collection Time: 05/09/17  7:01 AM  Result Value Ref Range   Prothrombin Time 13.1 11.4 - 15.2 seconds   INR 1.00     Comment: Performed at [facility], [place] [address] [place], [place] [address]  Type and screen Trail     Status:  None   Collection Time: 05/09/17  7:01 AM  Result Value Ref Range   ABO/RH(D) B POS    Antibody Screen NEG    Sample Expiration      05/12/2017 Performed at [facility], [place] [address], [place], [place] [address]   I-Stat Chem 8, ED     Status: Abnormal   Collection Time: 05/09/17  7:11 AM  Result Value Ref Range   Sodium 142 135 - 145 mmol/L   Potassium 3.6 3.5 - 5.1 mmol/L   Chloride 104 101 - 111 mmol/L   BUN 18 6 - 20 mg/dL   Creatinine, Ser 0.90 0.44 - 1.00 mg/dL   Glucose, Bld 176 (H) 65 - 99 mg/dL   Calcium, Ion 1.00 (L) 1.15 - 1.40 mmol/L   TCO2 26 22 - 32 mmol/L   Hemoglobin 12.6 12.0 - 15.0 g/dL   HCT 37.0 36.0 - 46.0 %  POC occult blood, ED     Status: Abnormal   Collection Time: 05/09/17 10:37 AM  Result Value Ref Range   Fecal Occult Bld POSITIVE (A) NEGATIVE   Ct Abdomen Pelvis W Contrast  Result Date: 05/09/2017 CLINICAL DATA:  Hematochezia. EXAM: CT ABDOMEN AND PELVIS WITH CONTRAST TECHNIQUE: Multidetector CT imaging of the abdomen and pelvis was performed using the standard protocol following bolus administration of intravenous contrast. CONTRAST:  121m ISOVUE-300 IOPAMIDOL (ISOVUE-300) INJECTION 61% COMPARISON:  CT abdomen pelvis dated April 22, 2017. FINDINGS: Lower chest: Bilateral breast nodules are unchanged. No acute abnormality. Hepatobiliary: Stable hepatic cysts. No new focal liver abnormality. The gallbladder is unremarkable. No biliary dilatation. Pancreas: Unremarkable. No pancreatic ductal dilatation or surrounding inflammatory changes. Spleen: Normal in size without focal abnormality. Adrenals/Urinary Tract: Adrenal glands are unremarkable. Kidneys are normal, without renal calculi, focal lesion, or hydronephrosis. Bladder is unremarkable. Stomach/Bowel: Small hiatal hernia. The stomach is otherwise within normal limits. Liquid stool throughout the colon. No bowel wall thickening, distention, or surrounding inflammatory changes.  Multiple colonic diverticula again noted. Normal appendix. Vascular/Lymphatic: Aortic atherosclerosis. No enlarged abdominal or pelvic lymph nodes. Reproductive: Status post hysterectomy. No adnexal masses. Other: Unchanged small fat containing umbilical hernia. No free fluid or pneumoperitoneum. Musculoskeletal: No acute or significant osseous findings. Moderate right hip osteoarthritis. IMPRESSION: 1.  No acute intra-abdominal process. 2. Scattered colonic diverticulosis without evidence of acute diverticulitis. Liquid stool throughout the colon. 3.  Aortic atherosclerosis (ICD10-I70.0). Electronically Signed   By: Titus Dubin M.D.   On: 05/09/2017 09:10    Pending Labs Unresulted Labs (From admission, onward)   Start     Ordered   05/09/17 1800  Hemoglobin and hematocrit, blood  Now then every 8 hours,   R    Comments:  Call Md for hgb 8 or less    05/09/17 1257   Signed and Held  HIV antibody (Routine Testing)  Once,   R     Signed and Held      Vitals/Pain Today's Vitals   05/09/17 1612 05/09/17 1615 05/09/17 1630 05/09/17 1645  BP: 126/86     Pulse: 94     Resp: 19 (!) 22 16 (!) 26  Temp:      TempSrc:      SpO2: 100% 100% 99% 97%  Weight:      Height:      PainSc:        Isolation Precautions No active isolations  Medications Medications  sodium chloride 0.9 % bolus 1,000 mL (0 mLs Intravenous Stopped 05/09/17 0736)    And  sodium chloride 0.9 % bolus 1,000 mL (0 mLs Intravenous Stopped 05/09/17 0935)    And  0.9 %  sodium chloride infusion ( Intravenous New Bag/Given 05/09/17 0736)  iopamidol (ISOVUE-300) 61 % injection (not administered)  morphine 2 MG/ML injection 1-2 mg (2 mg Intravenous Given 05/09/17 1301)  ondansetron (ZOFRAN) injection 4 mg (4 mg Intravenous Given 05/09/17 1301)  pantoprazole (PROTONIX) injection 40 mg (40 mg Intravenous Given 05/09/17 1301)  iopamidol (ISOVUE-300) 61 % injection 100 mL (100 mLs Intravenous Contrast Given 05/09/17 0830)   gabapentin (NEURONTIN) capsule 300 mg (300 mg Oral Given 05/09/17 1301)    Mobility walks

## 2017-05-09 NOTE — ED Notes (Signed)
EKG given to EDP Palumbo,MD., for review.

## 2017-05-09 NOTE — ED Notes (Signed)
Pt daughter Karyn Brull [phone]

## 2017-05-09 NOTE — H&P (Signed)
History and Physical    Ellen [name]:[id number] DOB: 11-02-1950 DOA: 05/09/2017  PCP: Horald Pollen, MD  Patient coming from: Home I have personally briefly reviewed patient's old medical records in [place]  Chief Complaint: Rectal bleed  HPI: ANALIS [name] is a 67 y.o. female with medical history significant of diverticulosis follows up with Dr. Ardis Hughs as outpatient, left-sided breast cancer status post surgery and radiation completed in 2010, hypertension, hyperlipidemia comes today for persistent bright red blood per rectum since 2 days associated with its nausea, vomiting and crampy abdominal pain. She denies any other complaints. On arrival to ED she had two bloody bowel movements. And her hemoglobin is around 12.6 . He reports aspirin use daily and uses pain meds for sciatica.  She was referred to medical service for admission for questionable lower GI bleed.  She also came to ED three times in the last few weeks for similar complaints.     Review of Systems: As per HPI otherwise 10 point review of systems negative.    Past Medical History:  Diagnosis Date  . Breast CA ([place]) 10/2007   S/P Surgery and Radiation Tx completed 03/20/08  . HTN (hypertension)   . Hyperlipidemia   . Personal history of radiation therapy 2009  . Thyroid goiter 03/2010   FNA of Left Thyroid Nodule - 05/14/2010    Past Surgical History:  Procedure Laterality Date  . ABDOMINAL HYSTERECTOMY  1986   Heavy bleeding  . BREAST BIOPSY  12/01/2007   Left axillary sentinal node bx : positive  . BREAST SURGERY  11/08/2007   Left partial mastectomy (lumpectomy)  . COLONOSCOPY  2010     reports that she quit smoking about 10 years ago. Her smoking use included cigarettes. she has never used smokeless tobacco. She reports that she does not drink alcohol or use drugs.  Allergies  Allergen Reactions  . Ace Inhibitors   . Simvastatin Swelling    lips    Family History  Problem  Relation Age of Onset  . Cancer Father        Died at 73 of Liver CA  . Diabetes Father   . Colon cancer Neg Hx    Family history reviewed.   Prior to Admission medications   Medication Sig Start Date End Date Taking? Authorizing Provider  amLODipine (NORVASC) 10 MG tablet Take 1 tablet (10 mg total) by mouth daily. 03/22/16  Yes Sagardia, Ines Bloomer, MD  anastrozole (ARIMIDEX) 1 MG tablet TAKE 1 TABLET (1 MG TOTAL) BY MOUTH DAILY. 02/06/17  Yes Nicholas Lose, MD  aspirin EC 325 MG tablet Take 1 tablet (325 mg total) by mouth daily. 11/09/13  Yes Barton Fanny, MD  calcium-vitamin D (OSCAL WITH D) 500-200 MG-UNIT per tablet Take 1 tablet by mouth 2 (two) times daily.   Yes [provider]  HYDROcodone-acetaminophen (NORCO/VICODIN) 5-325 MG tablet Take 1 tablet by mouth 4 (four) times daily as needed. Patient taking differently: Take 1 tablet by mouth 4 (four) times daily as needed for moderate pain.  05/01/17  Yes Wynona Luna, MD  metFORMIN (GLUCOPHAGE) 500 MG tablet Take 500 mg by mouth 2 (two) times daily. 03/24/17  Yes [provider]  naproxen (NAPROSYN) 500 MG tablet Take 1 tablet (500 mg total) by mouth 2 (two) times daily. 05/01/17  Yes Wynona Luna, MD  pravastatin (PRAVACHOL) 40 MG tablet Take 1 tablet (40 mg total) by mouth daily. 12/13/15  Yes Carlean Purl,  Dalbert Batman, FNP  acetaminophen (TYLENOL) 325 MG tablet Take 2 tablets (650 mg total) by mouth every 6 (six) hours as needed. Do not take more than 4000mg  of tylenol per day Patient not taking: Reported on 05/09/2017 04/20/17   Couture, Cortni S, PA-C  oxyCODONE-acetaminophen (PERCOCET) 5-325 MG tablet Take 1 tablet by mouth every 4 (four) hours as needed. Patient not taking: Reported on 05/09/2017 04/22/17   Nat Christen, MD  predniSONE (DELTASONE) 10 MG tablet Take 1 tablet (10 mg total) by mouth daily with breakfast. 3 tablets for 3 days, 2 tablets for 3 days, 1 tablet for 3 days. Patient not taking:  Reported on 05/09/2017 04/22/17   Nat Christen, MD    Physical Exam: Vitals:   05/09/17 0937 05/09/17 0938 05/09/17 0939 05/09/17 1138  BP:  115/78  115/82  Pulse: (!) 105 99 94 98  Resp: (!) 22 (!) 21 19 20   Temp:      TempSrc:      SpO2: 100% 100% 99% 100%  Weight:      Height:        Constitutional: NAD, calm, comfortable Vitals:   05/09/17 0937 05/09/17 0938 05/09/17 0939 05/09/17 1138  BP:  115/78  115/82  Pulse: (!) 105 99 94 98  Resp: (!) 22 (!) 21 19 20   Temp:      TempSrc:      SpO2: 100% 100% 99% 100%  Weight:      Height:       Eyes: PERRL, lids and conjunctivae normal ENMT: Mucous membranes are moist. Posterior pharynx clear of any exudate or lesions.Normal dentition.  Neck: normal, supple, no masses, no thyromegaly Respiratory: clear to auscultation bilaterally, no wheezing, no crackles. Normal respiratory effort. No accessory muscle use.  Cardiovascular: Regular rate and rhythm, no murmurs / rubs / gallops. No extremity edema. 2+ pedal pulses. No carotid bruits.  Abdomen: mod tenderness in the left lower quadrant. Musculoskeletal: no clubbing / cyanosis. No joint deformity upper and lower extremities. Good ROM, no contractures. Normal muscle tone.  Skin: no rashes, lesions, ulcers. No induration Neurologic: CN 2-12 grossly intact. Sensation intact, DTR normal. Strength 5/5 in all 4.  Psychiatric: Normal judgment and insight. Alert and oriented x 3. Normal mood.     Labs on Admission: I have personally reviewed following labs and imaging studies  CBC: Recent Labs  Lab 05/09/17 0701 05/09/17 0711  WBC 8.3  --   NEUTROABS 4.5  --   HGB 11.5* 12.6  HCT 35.9* 37.0  MCV 87.6  --   PLT 333  --    Basic Metabolic Panel: Recent Labs  Lab 05/09/17 0701 05/09/17 0711  NA 139 142  K 4.4 3.6  CL 103 104  CO2 24  --   GLUCOSE 175* 176*  BUN 17 18  CREATININE 1.15* 0.90  CALCIUM 8.3*  --    GFR: Estimated Creatinine Clearance: 55.2 mL/min (by C-G  formula based on SCr of 0.9 mg/dL). Liver Function Tests: Recent Labs  Lab 05/09/17 0701  AST 23  ALT 9*  ALKPHOS 42  BILITOT 0.3  PROT 6.8  ALBUMIN 3.7   No results for input(s): LIPASE, AMYLASE in the last 168 hours. No results for input(s): AMMONIA in the last 168 hours. Coagulation Profile: Recent Labs  Lab 05/09/17 0701  INR 1.00   Cardiac Enzymes: No results for input(s): CKTOTAL, CKMB, CKMBINDEX, TROPONINI in the last 168 hours. BNP (last 3 results) No results for input(s): PROBNP in the  last 8760 hours. HbA1C: No results for input(s): HGBA1C in the last 72 hours. CBG: No results for input(s): GLUCAP in the last 168 hours. Lipid Profile: No results for input(s): CHOL, HDL, LDLCALC, TRIG, CHOLHDL, LDLDIRECT in the last 72 hours. Thyroid Function Tests: No results for input(s): TSH, T4TOTAL, FREET4, T3FREE, THYROIDAB in the last 72 hours. Anemia Panel: No results for input(s): VITAMINB12, FOLATE, FERRITIN, TIBC, IRON, RETICCTPCT in the last 72 hours. Urine analysis:    Component Value Date/Time   COLORURINE STRAW (A) 04/22/2017 1938   APPEARANCEUR CLEAR 04/22/2017 1938   LABSPEC 1.010 05/01/2017 1705   PHURINE 7.5 05/01/2017 1705   GLUCOSEU NEGATIVE 05/01/2017 1705   HGBUR TRACE (A) 05/01/2017 1705   BILIRUBINUR NEGATIVE 05/01/2017 1705   BILIRUBINUR Negative 05/30/2014 1508   KETONESUR NEGATIVE 05/01/2017 1705   PROTEINUR NEGATIVE 05/01/2017 1705   UROBILINOGEN 0.2 05/01/2017 1705   NITRITE NEGATIVE 05/01/2017 1705   LEUKOCYTESUR NEGATIVE 05/01/2017 1705    Radiological Exams on Admission: Ct Abdomen Pelvis W Contrast  Result Date: 05/09/2017 CLINICAL DATA:  Hematochezia. EXAM: CT ABDOMEN AND PELVIS WITH CONTRAST TECHNIQUE: Multidetector CT imaging of the abdomen and pelvis was performed using the standard protocol following bolus administration of intravenous contrast. CONTRAST:  160mL ISOVUE-300 IOPAMIDOL (ISOVUE-300) INJECTION 61% COMPARISON:  CT  abdomen pelvis dated April 22, 2017. FINDINGS: Lower chest: Bilateral breast nodules are unchanged. No acute abnormality. Hepatobiliary: Stable hepatic cysts. No new focal liver abnormality. The gallbladder is unremarkable. No biliary dilatation. Pancreas: Unremarkable. No pancreatic ductal dilatation or surrounding inflammatory changes. Spleen: Normal in size without focal abnormality. Adrenals/Urinary Tract: Adrenal glands are unremarkable. Kidneys are normal, without renal calculi, focal lesion, or hydronephrosis. Bladder is unremarkable. Stomach/Bowel: Small hiatal hernia. The stomach is otherwise within normal limits. Liquid stool throughout the colon. No bowel wall thickening, distention, or surrounding inflammatory changes. Multiple colonic diverticula again noted. Normal appendix. Vascular/Lymphatic: Aortic atherosclerosis. No enlarged abdominal or pelvic lymph nodes. Reproductive: Status post hysterectomy. No adnexal masses. Other: Unchanged small fat containing umbilical hernia. No free fluid or pneumoperitoneum. Musculoskeletal: No acute or significant osseous findings. Moderate right hip osteoarthritis. IMPRESSION: 1.  No acute intra-abdominal process. 2. Scattered colonic diverticulosis without evidence of acute diverticulitis. Liquid stool throughout the colon. 3.  Aortic atherosclerosis (ICD10-I70.0). Electronically Signed   By: Titus Dubin M.D.   On: 05/09/2017 09:10    EKG: Independently reviewed. Sinus tachycardia.   Assessment/Plan Active Problems:   GI bleed  Bright red blood per rectum: Admit for evaluation of lower GI bleeding.  Initially hypotensive, but now she is normotensive after IV fluids.  Suspect diverticular bleed.  Transfuse to keep hemoglobin greater than 9.  IV PPI, IV FLUIDS, GI consult, check serial H&H.    Mild anemia from blood loss.  Monitor.    Back pain from sciatica. Pain control with home meds.    Left sided breast cancers/p surgery and  radiation:  Outpatient follow up with Dr Lindi Adie.     Hypertension/ hypotension:  bp parameters stable.    DVT prophylaxis:  scd's Code Status: full code.  Family Communication: family at bedside.  Disposition Plan: pending resolution of gi bleeding.  Consults called: Dr Henrene Pastor with GI.  Admission status: obs tele.    Hosie Poisson MD [facility] Pager [phone]  If 7PM-7AM, please contact night-coverage [email or web address] Password [facility]  05/09/2017, 12:11 PM

## 2017-05-09 NOTE — ED Provider Notes (Signed)
[facility] DEPT Provider Note   CSN: [id number] Arrival date & time: 05/09/17  0604   History   Chief Complaint Chief Complaint  Patient presents with  . Rectal Bleeding    HPI Ellen [name] is a 67 y.o. female who presents with rectal bleeding. PMH significant for hx of breast cancer, HTN, HLD. She was seen on 2/25 and 2/27 for acute left sided back pain. She had a CT of her abdomen/pelvis on 2/27 which was unremarkable. She was discharged with Percocet and steroids. She was seen again at [facility] on 3/8 and discharged with Naproxen and Norco. She states that she began to get constipated and so took a laxative. She started to move her bowels last night and did not have a problem. This morning around 5AM she got up and noticed a lot of bright red blood in the toilet which scared her so she came to the ED. In the waiting room she had to go to the bathroom two more times and each time she had gross bleeding and is unsure if there was any stool that came out. She also became hot and lightheaded and was noted to by hypotensive. She reports ongoing pain in the left pelvic/hip area. She states the steroid did help but the pain medicine didn't help that much. She denies fever, chills, abdominal pain, N/V. She had a colonoscopy in 2015 by Dr. Ardis Hughs which showed pre-cancerous polyps and diverticulum. She is not on blood thinners.  HPI  Past Medical History:  Diagnosis Date  . Breast CA ([place]) 10/2007   S/P Surgery and Radiation Tx completed 03/20/08  . HTN (hypertension)   . Hyperlipidemia   . Personal history of radiation therapy 2009  . Thyroid goiter 03/2010   FNA of Left Thyroid Nodule - 05/14/2010    Patient Active Problem List   Diagnosis Date Noted  . Malignant neoplasm of overlapping sites of left female breast ([place]) 06/29/2015  . Dry eye syndrome 06/01/2014  . Eye disease 06/01/2014  . Goiter 04/28/2013  . History of tobacco use 11/05/2011  . Breast  cancer of upper-outer quadrant of left female breast ([place]) 11/04/2011  . Obesity (BMI 30-39.9) 04/03/2011  . HTN (hypertension)   . COLONIC POLYPS, ADENOMATOUS, HX OF 05/02/2008  . Hyperlipidemia 12/11/2003    Class: Chronic    Past Surgical History:  Procedure Laterality Date  . ABDOMINAL HYSTERECTOMY  1986   Heavy bleeding  . BREAST BIOPSY  12/01/2007   Left axillary sentinal node bx : positive  . BREAST SURGERY  11/08/2007   Left partial mastectomy (lumpectomy)  . COLONOSCOPY  2010    OB History    No data available       Home Medications    Prior to Admission medications   Medication Sig Start Date End Date Taking? Authorizing Provider  amLODipine (NORVASC) 10 MG tablet Take 1 tablet (10 mg total) by mouth daily. 03/22/16  Yes Sagardia, Ines Bloomer, MD  anastrozole (ARIMIDEX) 1 MG tablet TAKE 1 TABLET (1 MG TOTAL) BY MOUTH DAILY. 02/06/17  Yes Nicholas Lose, MD  aspirin EC 325 MG tablet Take 1 tablet (325 mg total) by mouth daily. 11/09/13  Yes Barton Fanny, MD  calcium-vitamin D (OSCAL WITH D) 500-200 MG-UNIT per tablet Take 1 tablet by mouth 2 (two) times daily.   Yes [provider]  HYDROcodone-acetaminophen (NORCO/VICODIN) 5-325 MG tablet Take 1 tablet by mouth 4 (four) times daily as needed. Patient taking differently: Take  1 tablet by mouth 4 (four) times daily as needed for moderate pain.  05/01/17  Yes Wynona Luna, MD  metFORMIN (GLUCOPHAGE) 500 MG tablet Take 500 mg by mouth 2 (two) times daily. 03/24/17  Yes [provider]  naproxen (NAPROSYN) 500 MG tablet Take 1 tablet (500 mg total) by mouth 2 (two) times daily. 05/01/17  Yes Wynona Luna, MD  pravastatin (PRAVACHOL) 40 MG tablet Take 1 tablet (40 mg total) by mouth daily. 12/13/15  Yes Elby Beck, FNP  acetaminophen (TYLENOL) 325 MG tablet Take 2 tablets (650 mg total) by mouth every 6 (six) hours as needed. Do not take more than 4000mg  of tylenol per day Patient  not taking: Reported on 05/09/2017 04/20/17   Couture, Cortni S, PA-C  oxyCODONE-acetaminophen (PERCOCET) 5-325 MG tablet Take 1 tablet by mouth every 4 (four) hours as needed. Patient not taking: Reported on 05/09/2017 04/22/17   Nat Christen, MD  predniSONE (DELTASONE) 10 MG tablet Take 1 tablet (10 mg total) by mouth daily with breakfast. 3 tablets for 3 days, 2 tablets for 3 days, 1 tablet for 3 days. Patient not taking: Reported on 05/09/2017 04/22/17   Nat Christen, MD    Family History Family History  Problem Relation Age of Onset  . Cancer Father        Died at 41 of Liver CA  . Diabetes Father   . Colon cancer Neg Hx     Social History Social History   Tobacco Use  . Smoking status: Former Smoker    Types: Cigarettes    Last attempt to quit: 02/25/2007    Years since quitting: 10.2  . Smokeless tobacco: Never Used  Substance Use Topics  . Alcohol use: No  . Drug use: No     Allergies   Ace inhibitors and Simvastatin   Review of Systems Review of Systems  Constitutional: Negative for chills and fever.  Respiratory: Negative for shortness of breath.   Cardiovascular: Negative for chest pain.  Gastrointestinal: Positive for blood in stool and constipation. Negative for abdominal pain, nausea and vomiting.  Genitourinary: Negative for dysuria.  Hematological: Does not bruise/bleed easily.  All other systems reviewed and are negative.    Physical Exam Updated Vital Signs BP 115/78   Pulse 94   Temp 98.2 F (36.8 C) (Oral)   Resp 19   Ht 5\' 2"  (1.575 m)   Wt 65.2 kg (143 lb 12.8 oz)   SpO2 99%   BMI 26.30 kg/m   Physical Exam  Constitutional: She is oriented to person, place, and time. She appears well-developed and well-nourished. No distress.  HENT:  Head: Normocephalic and atraumatic.  Eyes: Conjunctivae are normal. Pupils are equal, round, and reactive to light. Right eye exhibits no discharge. Left eye exhibits no discharge. No scleral icterus.  Neck:  Normal range of motion.  Cardiovascular: Regular rhythm. Tachycardia present.  Pulmonary/Chest: Effort normal and breath sounds normal. No respiratory distress.  Abdominal: Soft. Bowel sounds are normal. She exhibits no distension and no mass. There is tenderness (Left pelvic and anterior hip). There is no rebound and no guarding. No hernia.  Genitourinary:  Genitourinary Comments: Rectal: Skin tags on anus. Gross red blood noted on rectal exam. Chaperone (Patty, RN) present during exam.   Neurological: She is alert and oriented to person, place, and time.  Skin: Skin is warm and dry.  Psychiatric: She has a normal mood and affect. Her behavior is normal.  Nursing note and  vitals reviewed.    ED Treatments / Results  Labs (all labs ordered are listed, but only abnormal results are displayed) Labs Reviewed  COMPREHENSIVE METABOLIC PANEL - Abnormal; Notable for the following components:      Result Value   Glucose, Bld 175 (*)    Creatinine, Ser 1.15 (*)    Calcium 8.3 (*)    ALT 9 (*)    GFR calc non Af Amer 49 (*)    GFR calc Af Amer 57 (*)    All other components within normal limits  CBC WITH DIFFERENTIAL/PLATELET - Abnormal; Notable for the following components:   Hemoglobin 11.5 (*)    HCT 35.9 (*)    All other components within normal limits  I-STAT CHEM 8, ED - Abnormal; Notable for the following components:   Glucose, Bld 176 (*)    Calcium, Ion 1.00 (*)    All other components within normal limits  POC OCCULT BLOOD, ED - Abnormal; Notable for the following components:   Fecal Occult Bld POSITIVE (*)    All other components within normal limits  PROTIME-INR  HEMOGLOBIN AND HEMATOCRIT, BLOOD  TYPE AND SCREEN  ABO/RH    EKG  EKG Interpretation  Date/Time:  Saturday May 09 2017 06:52:11 EDT Ventricular Rate:  112 PR Interval:    QRS Duration: 89 QT Interval:  333 QTC Calculation: 455 R Axis:   -84 Text Interpretation:  Sinus tachycardia Confirmed by  Dory Horn) on 05/09/2017 6:56:34 AM       Radiology Ct Abdomen Pelvis W Contrast  Result Date: 05/09/2017 CLINICAL DATA:  Hematochezia. EXAM: CT ABDOMEN AND PELVIS WITH CONTRAST TECHNIQUE: Multidetector CT imaging of the abdomen and pelvis was performed using the standard protocol following bolus administration of intravenous contrast. CONTRAST:  152mL ISOVUE-300 IOPAMIDOL (ISOVUE-300) INJECTION 61% COMPARISON:  CT abdomen pelvis dated April 22, 2017. FINDINGS: Lower chest: Bilateral breast nodules are unchanged. No acute abnormality. Hepatobiliary: Stable hepatic cysts. No new focal liver abnormality. The gallbladder is unremarkable. No biliary dilatation. Pancreas: Unremarkable. No pancreatic ductal dilatation or surrounding inflammatory changes. Spleen: Normal in size without focal abnormality. Adrenals/Urinary Tract: Adrenal glands are unremarkable. Kidneys are normal, without renal calculi, focal lesion, or hydronephrosis. Bladder is unremarkable. Stomach/Bowel: Small hiatal hernia. The stomach is otherwise within normal limits. Liquid stool throughout the colon. No bowel wall thickening, distention, or surrounding inflammatory changes. Multiple colonic diverticula again noted. Normal appendix. Vascular/Lymphatic: Aortic atherosclerosis. No enlarged abdominal or pelvic lymph nodes. Reproductive: Status post hysterectomy. No adnexal masses. Other: Unchanged small fat containing umbilical hernia. No free fluid or pneumoperitoneum. Musculoskeletal: No acute or significant osseous findings. Moderate right hip osteoarthritis. IMPRESSION: 1.  No acute intra-abdominal process. 2. Scattered colonic diverticulosis without evidence of acute diverticulitis. Liquid stool throughout the colon. 3.  Aortic atherosclerosis (ICD10-I70.0). Electronically Signed   By: Titus Dubin M.D.   On: 05/09/2017 09:10    Procedures Procedures (including critical care time)  Medications Ordered in  ED Medications  sodium chloride 0.9 % bolus 1,000 mL (0 mLs Intravenous Stopped 05/09/17 0736)    And  sodium chloride 0.9 % bolus 1,000 mL (0 mLs Intravenous Stopped 05/09/17 0935)    And  0.9 %  sodium chloride infusion ( Intravenous New Bag/Given 05/09/17 0736)  iopamidol (ISOVUE-300) 61 % injection (not administered)  morphine 2 MG/ML injection 1-2 mg (not administered)  ondansetron (ZOFRAN) injection 4 mg (not administered)  pantoprazole (PROTONIX) injection 40 mg (not administered)  gabapentin (NEURONTIN) capsule 300 mg (  not administered)  iopamidol (ISOVUE-300) 61 % injection 100 mL (100 mLs Intravenous Contrast Given 05/09/17 0830)     Initial Impression / Assessment and Plan / ED Course  I have reviewed the triage vital signs and the nursing notes.  Pertinent labs & imaging results that were available during my care of the patient were reviewed by me and considered in my medical decision making (see chart for details).  67 year old female presents with acute lower GI bleeding. She is tachycardic and was hypotensive on arrival to ED. Her BP has stabilized after 2L of IVF. Since being in the ED she had several episodes of BRBPR which was witnessed by nursing staff. Her hemoccult was positive for gross blood. Her CBC was reassuring - hgb is 11.5. CMP is remarkable for hyperglycemia and mild AKI (1.15). CT of abdomen/pelvis is remarkable for diverticulosis without evidence of diverticulitis. Discussed results with patient. She is very worried about her ongoing bleeding. She still complains of LLQ/L hip pain. Will try Gabapentin for pain. Discussed with Nicoletta Ba with GI who will come to see the patient. Also discussed with Dr. Karleen Hampshire with [facility] who will admit.  Final Clinical Impressions(s) / ED Diagnoses   Final diagnoses:  Acute GI bleeding    ED Discharge Orders    None       Recardo Evangelist, PA-C 05/09/17 1251    Mabe, Forbes Cellar, MD 05/09/17 1257

## 2017-05-09 NOTE — ED Notes (Signed)
Pt c/o bright red blood in her stool. Denies blood thinners.

## 2017-05-09 NOTE — ED Notes (Signed)
Pt had one instance of blood in the bed. Pt was assisted out of her soiled clothes. Pt provided with basin, cleaner and wash cloths to clean herself per her request. Pt linen and bed pad changed. Pt required little assistance. Pt denies dizziness or other c/o

## 2017-05-09 NOTE — ED Provider Notes (Signed)
MSE was initiated and I personally evaluated the patient and placed orders (if any) at  6:59 AM on May 09, 2017.  The patient appears stable so that the remainder of the MSE may be completed by another provider.  EOMI Tachy nl s1s2 CTAB NABS, soft no guarding  2 large bore IV boluses ordered along with CT and type and screen.  Signed out to Dr. Theodoro Kalata, Advaith Lamarque, MD 05/09/17 [phone]

## 2017-05-10 ENCOUNTER — Other Ambulatory Visit: Payer: Self-pay

## 2017-05-10 DIAGNOSIS — K922 Gastrointestinal hemorrhage, unspecified: Secondary | ICD-10-CM | POA: Diagnosis not present

## 2017-05-10 DIAGNOSIS — K5791 Diverticulosis of intestine, part unspecified, without perforation or abscess with bleeding: Secondary | ICD-10-CM

## 2017-05-10 DIAGNOSIS — C50812 Malignant neoplasm of overlapping sites of left female breast: Secondary | ICD-10-CM

## 2017-05-10 DIAGNOSIS — I10 Essential (primary) hypertension: Secondary | ICD-10-CM

## 2017-05-10 DIAGNOSIS — K625 Hemorrhage of anus and rectum: Secondary | ICD-10-CM

## 2017-05-10 LAB — HEMOGLOBIN AND HEMATOCRIT, BLOOD
HCT: 29.6 % — ABNORMAL LOW (ref 36.0–46.0)
HEMATOCRIT: 29.7 % — AB (ref 36.0–46.0)
HEMATOCRIT: 29.4 % — AB (ref 36.0–46.0)
HEMOGLOBIN: 9.6 g/dL — AB (ref 12.0–15.0)
HEMOGLOBIN: 9.3 g/dL — AB (ref 12.0–15.0)
Hemoglobin: 9.3 g/dL — ABNORMAL LOW (ref 12.0–15.0)

## 2017-05-10 MED ORDER — HYDROCODONE-ACETAMINOPHEN 5-325 MG PO TABS: 1.0000 | ORAL_TABLET | Freq: Four times a day (QID) | ORAL | Status: DC | PRN

## 2017-05-10 MED ORDER — ACETAMINOPHEN 325 MG PO TABS: 650.0000 mg | ORAL_TABLET | Freq: Four times a day (QID) | ORAL | Status: DC | PRN

## 2017-05-10 MED ORDER — PRAVASTATIN SODIUM 40 MG PO TABS
40.0000 mg | ORAL_TABLET | Freq: Every day | ORAL | Status: DC
  Administered 2017-05-10 – 2017-05-11 (×2): 40 mg via ORAL
  Filled 2017-05-10 (×2): qty 1

## 2017-05-10 MED ORDER — ANASTROZOLE 1 MG PO TABS
1.0000 mg | ORAL_TABLET | Freq: Every day | ORAL | Status: DC
  Administered 2017-05-10 – 2017-05-11 (×2): 1 mg via ORAL
  Filled 2017-05-10 (×2): qty 1

## 2017-05-10 NOTE — Progress Notes (Addendum)
Patient ID: Ellen [name], female   DOB: 1951/01/30, 67 y.o.   MRN: [id number]    Progress Note   Subjective   Feels ok - no BM since admit- no c/o pain or cramping HGB has drifted to 9.6   Objective   Vital signs in last 24 hours: Temp:  [98.8 F (37.1 C)-99.2 F (37.3 C)] 99.2 F (37.3 C) (03/17 0507) Pulse Rate:  [76-107] 76 (03/17 0507) Resp:  [14-26] 21 (03/17 0507) BP: (115-158)/(65-86) 128/65 (03/17 0507) SpO2:  [95 %-100 %] 99 % (03/17 0507) Weight:  [150 lb 9.2 oz (68.3 kg)] 150 lb 9.2 oz (68.3 kg) (03/16 1813) Last BM Date: 05/09/17 General:    AA  female in NAD Heart:  Regular rate and rhythm; no murmurs Lungs: Respirations even and unlabored, lungs CTA bilaterally Abdomen:  Soft, nontender and nondistended. Normal bowel sounds. Extremities:  Without edema. Neurologic:  Alert and oriented,  grossly normal neurologically. Psych:  Cooperative. Normal mood and affect.  Intake/Output from previous day: 03/16 0701 - 03/17 0700 In: 543.8 [I.V.:543.8] Out: -  Intake/Output this shift: No intake/output data recorded.  Lab Results: Recent Labs    05/09/17 0701 05/09/17 0711 05/09/17 1756 05/10/17 0210  WBC 8.3  --   --   --   HGB 11.5* 12.6 10.1* 9.6*  HCT 35.9* 37.0 31.9* 29.6*  PLT 333  --   --   --    BMET Recent Labs    05/09/17 0701 05/09/17 0711  NA 139 142  K 4.4 3.6  CL 103 104  CO2 24  --   GLUCOSE 175* 176*  BUN 17 18  CREATININE 1.15* 0.90  CALCIUM 8.3*  --    LFT Recent Labs    05/09/17 0701  PROT 6.8  ALBUMIN 3.7  AST 23  ALT 9*  ALKPHOS 42  BILITOT 0.3   PT/INR Recent Labs    05/09/17 0701  LABPROT 13.1  INR 1.00    Studies/Results: Ct Abdomen Pelvis W Contrast  Result Date: 05/09/2017 CLINICAL DATA:  Hematochezia. EXAM: CT ABDOMEN AND PELVIS WITH CONTRAST TECHNIQUE: Multidetector CT imaging of the abdomen and pelvis was performed using the standard protocol following bolus administration of intravenous contrast.  CONTRAST:  142mL ISOVUE-300 IOPAMIDOL (ISOVUE-300) INJECTION 61% COMPARISON:  CT abdomen pelvis dated April 22, 2017. FINDINGS: Lower chest: Bilateral breast nodules are unchanged. No acute abnormality. Hepatobiliary: Stable hepatic cysts. No new focal liver abnormality. The gallbladder is unremarkable. No biliary dilatation. Pancreas: Unremarkable. No pancreatic ductal dilatation or surrounding inflammatory changes. Spleen: Normal in size without focal abnormality. Adrenals/Urinary Tract: Adrenal glands are unremarkable. Kidneys are normal, without renal calculi, focal lesion, or hydronephrosis. Bladder is unremarkable. Stomach/Bowel: Small hiatal hernia. The stomach is otherwise within normal limits. Liquid stool throughout the colon. No bowel wall thickening, distention, or surrounding inflammatory changes. Multiple colonic diverticula again noted. Normal appendix. Vascular/Lymphatic: Aortic atherosclerosis. No enlarged abdominal or pelvic lymph nodes. Reproductive: Status post hysterectomy. No adnexal masses. Other: Unchanged small fat containing umbilical hernia. No free fluid or pneumoperitoneum. Musculoskeletal: No acute or significant osseous findings. Moderate right hip osteoarthritis. IMPRESSION: 1.  No acute intra-abdominal process. 2. Scattered colonic diverticulosis without evidence of acute diverticulitis. Liquid stool throughout the colon. 3.  Aortic atherosclerosis (ICD10-I70.0). Electronically Signed   By: Titus Dubin M.D.   On: 05/09/2017 09:10       Assessment / Plan:    #1 67 yo female with acute lower GI bleed - very likely self-limited  diverticular bleed - No ongoing bleeding since admit - hgb down 4 grams- No transfusion requirement as yet  Plan;  will advance diet today Continue observation until tomorrow - if no further bleeding and hgb stable can go home tomorrow  She was on ASA 325 mg at home - for general purposes- hold ASA x 2 weeks then  Can resume 81 mg ASA Should  have repeat CBC in a week after D/C  We will be available if needed  Contact  Amy Esterwood, P.A.-C               [phone]    GI ATTENDING  Interval history data reviewed. Patient personally seen and examined. Granddaughter and great grandson in room. Patient has had no further bleeding. No bowel movements. No GI complaints. Agree with interval progress note as outlined above.  Docia Chuck. Geri Seminole., M.D. [facility] Division of Gastroenterology

## 2017-05-10 NOTE — Plan of Care (Signed)
  Nutrition: Adequate nutrition will be maintained 05/10/2017 2318 - Progressing by Randa Lynn D, RN   Elimination: Will not experience complications related to bowel motility 05/10/2017 2318 - Progressing by Michaela Corner, RN Will not experience complications related to urinary retention 05/10/2017 2318 - Progressing by Michaela Corner, RN   Pain Managment: General experience of comfort will improve 05/10/2017 2318 - Progressing by Michaela Corner, RN   Bowel/Gastric: Will show no signs and symptoms of gastrointestinal bleeding 05/10/2017 2318 - Progressing by Michaela Corner, RN

## 2017-05-10 NOTE — Plan of Care (Signed)
  Progressing Nutrition: Adequate nutrition will be maintained 05/10/2017 1327 - Progressing by Yilia Sacca W, RN Coping: Level of anxiety will decrease 05/10/2017 1327 - Progressing by Natanael Saladin, Scarlett Presto, RN Pain Managment: General experience of comfort will improve 05/10/2017 1327 - Progressing by Kennley Schwandt, Scarlett Presto, RN Fluid Volume: Will show no signs and symptoms of excessive bleeding 05/10/2017 1327 - Progressing by Shandi Godfrey, Scarlett Presto, RN   Adequate for Discharge Education: Knowledge of General Education information will improve 05/10/2017 1327 - Adequate for Discharge by Tagan Bartram, Scarlett Presto, RN Clinical Measurements: Ability to maintain clinical measurements within normal limits will improve 05/10/2017 1327 - Adequate for Discharge by Kely Dohn, Scarlett Presto, RN Will remain free from infection 05/10/2017 1327 - Adequate for Discharge by Remo Kirschenmann, Scarlett Presto, RN Diagnostic test results will improve 05/10/2017 1327 - Adequate for Discharge by Keyshun Elpers, Scarlett Presto, RN Respiratory complications will improve 05/10/2017 1327 - Adequate for Discharge by Israel Werts, Scarlett Presto, RN Cardiovascular complication will be avoided 05/10/2017 1327 - Adequate for Discharge by Adric Wrede, Scarlett Presto, RN Elimination: Will not experience complications related to bowel motility 05/10/2017 1327 - Adequate for Discharge by Patrena Santalucia, Scarlett Presto, RN Will not experience complications related to urinary retention 05/10/2017 1327 - Adequate for Discharge by Shanice Poznanski, Scarlett Presto, RN Safety: Ability to remain free from injury will improve 05/10/2017 1327 - Adequate for Discharge by [place] Duerst, Scarlett Presto, RN Skin Integrity: Risk for impaired skin integrity will decrease 05/10/2017 1327 - Adequate for Discharge by Natnael Biederman, Scarlett Presto, RN

## 2017-05-10 NOTE — Progress Notes (Signed)
PROGRESS NOTE    Ellen [name]  WSF:[id number] DOB: [birth year] DOA: 05/09/2017 PCP: Horald Pollen, MD   Brief Narrative:  Ellen [name] is a 67 y.o. female with medical history significant of diverticulosis follows up with Dr. Ardis Hughs as outpatient, left-sided breast cancer status post surgery and radiation completed in 2010, hypertension, hyperlipidemia comes today for persistent bright red blood per rectum since 2 days associated with its nausea, vomiting and crampy abdominal pain. She denies any other complaints. On arrival to ED she had two bloody bowel movements. And her hemoglobin is around 12.6 . She reports aspirin use daily and uses pain meds for sciatica. She was referred to medical service for admission for suspected lower GI bleed. She also came to ED three times in the last few weeks for similar complaints.   Assessment & Plan:   Active Problems:   HTN (hypertension)   Hyperlipidemia   Malignant neoplasm of overlapping sites of left female breast ([place])   GI bleed  Bright Red Blood per rectum suspected Lower GI Diverticular Bleed -Admit for evaluation of lower GI bleeding.  -Initially hypotensive, but now she is normotensive after IV fluids.  -Suspect diverticular bleed.  -Transfuse to keep hemoglobin greater than 9.  -IV PPI 40 mg q12h, IV FLUIDS, GI consult, check serial H&H. -GI recommending advancing diet today as she had no further bleeding and bowel movements; They recommend observing tonight and if no further bleeding and Hb/Hct stable she can go home tomorrow -Hb/HCt now 9.3/29.4 -GI recommending Holding ASA 2 weeks and resuming 81 mg ASA after and repeating CBC 1 week after D/C -Continue to Monitor for S/Sx of Bleeding  -Repeat CBC in AM  Acute Blood Loss Anemia -Hb/Hct went from 11.5/35.9 -> 12.6/37.0 -> 10.1/31.9 -> 9.6/29.6 -> 9.3/29.7 -> 9.3/29.4 -? Dilutional Drop from 2 Liter NS boluses and Maintenance IVF at 125 mL/hr -Continue to Monitor for S/Sx  of Bleeding  -Repeat CBC in AM   Back Pain fromSciatica.  -Pain control with IV Morphine 1-2 mg q4hprn, Home Norco, and Acetaminophen  Left sided Breast cancers/p surgery and radiation:  -Outpatient follow up with Dr Lindi Adie.  -C/w Arimidex  Hypertension/ Hypotension  -BP now 119/66 -Continue to Monitor BP's  HLD -C/w Pravastatin 40 mg po Daily   DVT prophylaxis: SCDs Code Status: FULL CODE Family Communication: No family present at bedside  Disposition Plan: Anticipate D/C Home in Next 24-48 hours  Consultants:   Gastroenterology Dr. Henrene Pastor   Procedures: None  Antimicrobials:  Anti-infectives (From admission, onward)   None     Subjective: Seen and examined at bedside and denied any abdominal pain but was complaining of Left back and flank pain. No CP or SOB. No further bleeding or bowel movements.   Objective: Vitals:   05/09/17 1813 05/09/17 1958 05/10/17 0507 05/10/17 1325  BP: 134/74 116/74 128/65 119/66  Pulse: 85 89 76 97  Resp: 20 20 (!) 21 20  Temp: 99.2 F (37.3 C) 98.8 F (37.1 C) 99.2 F (37.3 C) 99.3 F (37.4 C)  TempSrc: Oral Oral Oral Oral  SpO2: 99% 98% 99% 100%  Weight: 68.3 kg (150 lb 9.2 oz)     Height: 5\' 2"  (1.575 m)       Intake/Output Summary (Last 24 hours) at 05/10/2017 2102 Last data filed at 05/10/2017 1855 Gross per 24 hour  Intake 2753.34 ml  Output 400 ml  Net 2353.34 ml   Filed Weights   05/09/17 0639 05/09/17 1813  Weight: 65.2 kg (143 lb 12.8 oz) 68.3 kg (150 lb 9.2 oz)   Examination: Physical Exam:  Constitutional: WN/WD AAF in NAD and appears calm and comfortable Eyes: Lids and conjunctivae normal, sclerae anicteric  ENMT: External Ears, Nose appear normal. Grossly normal hearing. Mucous membranes are moist.  Neck: Appears normal, supple, no cervical masses, normal ROM, no appreciable thyromegaly, no JVD Respiratory: Clear to auscultation bilaterally, no wheezing, rales, rhonchi or crackles. Normal respiratory  effort and patient is not tachypenic. No accessory muscle use.  Cardiovascular: RRR, no murmurs / rubs / gallops. S1 and S2 auscultated. No LE Edema Abdomen: Soft, non-tender, non-distended. No masses palpated. No appreciable hepatosplenomegaly. Bowel sounds positive x4.  GU: Deferred. Musculoskeletal: No clubbing / cyanosis of digits/nails. No joint deformity upper and lower extremities.  Skin: No rashes, lesions, ulcers on a limited skin eval. No induration; Warm and dry.  Neurologic: CN 2-12 grossly intact with no focal deficits. Romberg sign and cerebellar reflexes not assessed.  Psychiatric: Normal judgment and insight. Alert and oriented x 3. Normal mood and appropriate affect.   Data Reviewed: I have personally reviewed following labs and imaging studies  CBC: Recent Labs  Lab 05/09/17 0701 05/09/17 0711 05/09/17 1756 05/10/17 0210 05/10/17 1007 05/10/17 1828  WBC 8.3  --   --   --   --   --   NEUTROABS 4.5  --   --   --   --   --   HGB 11.5* 12.6 10.1* 9.6* 9.3* 9.3*  HCT 35.9* 37.0 31.9* 29.6* 29.7* 29.4*  MCV 87.6  --   --   --   --   --   PLT 333  --   --   --   --   --    Basic Metabolic Panel: Recent Labs  Lab 05/09/17 0701 05/09/17 0711  NA 139 142  K 4.4 3.6  CL 103 104  CO2 24  --   GLUCOSE 175* 176*  BUN 17 18  CREATININE 1.15* 0.90  CALCIUM 8.3*  --    GFR: Estimated Creatinine Clearance: 56.5 mL/min (by C-G formula based on SCr of 0.9 mg/dL). Liver Function Tests: Recent Labs  Lab 05/09/17 0701  AST 23  ALT 9*  ALKPHOS 42  BILITOT 0.3  PROT 6.8  ALBUMIN 3.7   No results for input(s): LIPASE, AMYLASE in the last 168 hours. No results for input(s): AMMONIA in the last 168 hours. Coagulation Profile: Recent Labs  Lab 05/09/17 0701  INR 1.00   Cardiac Enzymes: No results for input(s): CKTOTAL, CKMB, CKMBINDEX, TROPONINI in the last 168 hours. BNP (last 3 results) No results for input(s): PROBNP in the last 8760 hours. HbA1C: No results  for input(s): HGBA1C in the last 72 hours. CBG: No results for input(s): GLUCAP in the last 168 hours. Lipid Profile: No results for input(s): CHOL, HDL, LDLCALC, TRIG, CHOLHDL, LDLDIRECT in the last 72 hours. Thyroid Function Tests: No results for input(s): TSH, T4TOTAL, FREET4, T3FREE, THYROIDAB in the last 72 hours. Anemia Panel: No results for input(s): VITAMINB12, FOLATE, FERRITIN, TIBC, IRON, RETICCTPCT in the last 72 hours. Sepsis Labs: No results for input(s): PROCALCITON, LATICACIDVEN in the last 168 hours.  No results found for this or any previous visit (from the past 240 hour(s)).   Radiology Studies: Ct Abdomen Pelvis W Contrast  Result Date: 05/09/2017 CLINICAL DATA:  Hematochezia. EXAM: CT ABDOMEN AND PELVIS WITH CONTRAST TECHNIQUE: Multidetector CT imaging of the abdomen and pelvis was performed using  the standard protocol following bolus administration of intravenous contrast. CONTRAST:  174mL ISOVUE-300 IOPAMIDOL (ISOVUE-300) INJECTION 61% COMPARISON:  CT abdomen pelvis dated April 22, 2017. FINDINGS: Lower chest: Bilateral breast nodules are unchanged. No acute abnormality. Hepatobiliary: Stable hepatic cysts. No new focal liver abnormality. The gallbladder is unremarkable. No biliary dilatation. Pancreas: Unremarkable. No pancreatic ductal dilatation or surrounding inflammatory changes. Spleen: Normal in size without focal abnormality. Adrenals/Urinary Tract: Adrenal glands are unremarkable. Kidneys are normal, without renal calculi, focal lesion, or hydronephrosis. Bladder is unremarkable. Stomach/Bowel: Small hiatal hernia. The stomach is otherwise within normal limits. Liquid stool throughout the colon. No bowel wall thickening, distention, or surrounding inflammatory changes. Multiple colonic diverticula again noted. Normal appendix. Vascular/Lymphatic: Aortic atherosclerosis. No enlarged abdominal or pelvic lymph nodes. Reproductive: Status post hysterectomy. No adnexal  masses. Other: Unchanged small fat containing umbilical hernia. No free fluid or pneumoperitoneum. Musculoskeletal: No acute or significant osseous findings. Moderate right hip osteoarthritis. IMPRESSION: 1.  No acute intra-abdominal process. 2. Scattered colonic diverticulosis without evidence of acute diverticulitis. Liquid stool throughout the colon. 3.  Aortic atherosclerosis (ICD10-I70.0). Electronically Signed   By: Titus Dubin M.D.   On: 05/09/2017 09:10   Scheduled Meds: . pantoprazole (PROTONIX) IV  40 mg Intravenous Q12H   Continuous Infusions: . sodium chloride 125 mL/hr at 05/10/17 2013    LOS: 0 days   Kerney Elbe, DO [facility] Pager [phone]  If 7PM-7AM, please contact night-coverage [email or web address] Password [email or web address] 05/10/2017, 9:02 PM

## 2017-05-11 DIAGNOSIS — M5432 Sciatica, left side: Secondary | ICD-10-CM

## 2017-05-11 DIAGNOSIS — E785 Hyperlipidemia, unspecified: Secondary | ICD-10-CM | POA: Diagnosis not present

## 2017-05-11 DIAGNOSIS — K922 Gastrointestinal hemorrhage, unspecified: Secondary | ICD-10-CM | POA: Diagnosis not present

## 2017-05-11 DIAGNOSIS — I10 Essential (primary) hypertension: Secondary | ICD-10-CM | POA: Diagnosis not present

## 2017-05-11 DIAGNOSIS — C50812 Malignant neoplasm of overlapping sites of left female breast: Secondary | ICD-10-CM | POA: Diagnosis not present

## 2017-05-11 DIAGNOSIS — N179 Acute kidney failure, unspecified: Secondary | ICD-10-CM | POA: Diagnosis not present

## 2017-05-11 DIAGNOSIS — K5791 Diverticulosis of intestine, part unspecified, without perforation or abscess with bleeding: Secondary | ICD-10-CM | POA: Diagnosis not present

## 2017-05-11 LAB — HEMOGLOBIN AND HEMATOCRIT, BLOOD
HEMATOCRIT: 28.7 % — AB (ref 36.0–46.0)
Hemoglobin: 9.3 g/dL — ABNORMAL LOW (ref 12.0–15.0)

## 2017-05-11 LAB — COMPREHENSIVE METABOLIC PANEL
ALK PHOS: 40 U/L (ref 38–126)
ALT: 12 U/L — ABNORMAL LOW (ref 14–54)
ANION GAP: 7 (ref 5–15)
AST: 14 U/L — ABNORMAL LOW (ref 15–41)
Albumin: 3.2 g/dL — ABNORMAL LOW (ref 3.5–5.0)
BILIRUBIN TOTAL: 0.3 mg/dL (ref 0.3–1.2)
BUN: 10 mg/dL (ref 6–20)
CALCIUM: 8.1 mg/dL — AB (ref 8.9–10.3)
CO2: 23 mmol/L (ref 22–32)
Chloride: 111 mmol/L (ref 101–111)
Creatinine, Ser: 0.82 mg/dL (ref 0.44–1.00)
Glucose, Bld: 115 mg/dL — ABNORMAL HIGH (ref 65–99)
Potassium: 3.5 mmol/L (ref 3.5–5.1)
SODIUM: 141 mmol/L (ref 135–145)
TOTAL PROTEIN: 6 g/dL — AB (ref 6.5–8.1)

## 2017-05-11 LAB — CBC WITH DIFFERENTIAL/PLATELET
Basophils Absolute: 0 10*3/uL (ref 0.0–0.1)
Basophils Relative: 0 %
EOS ABS: 0.2 10*3/uL (ref 0.0–0.7)
Eosinophils Relative: 2 %
HEMATOCRIT: 27.1 % — AB (ref 36.0–46.0)
HEMOGLOBIN: 8.6 g/dL — AB (ref 12.0–15.0)
LYMPHS ABS: 2.2 10*3/uL (ref 0.7–4.0)
Lymphocytes Relative: 27 %
MCH: 27.9 pg (ref 26.0–34.0)
MCHC: 31.7 g/dL (ref 30.0–36.0)
MCV: 88 fL (ref 78.0–100.0)
MONOS PCT: 11 %
Monocytes Absolute: 0.9 10*3/uL (ref 0.1–1.0)
NEUTROS PCT: 60 %
Neutro Abs: 4.7 10*3/uL (ref 1.7–7.7)
Platelets: 284 10*3/uL (ref 150–400)
RBC: 3.08 MIL/uL — ABNORMAL LOW (ref 3.87–5.11)
RDW: 14.7 % (ref 11.5–15.5)
WBC: 8 10*3/uL (ref 4.0–10.5)

## 2017-05-11 LAB — MAGNESIUM: MAGNESIUM: 2.1 mg/dL (ref 1.7–2.4)

## 2017-05-11 LAB — PHOSPHORUS: Phosphorus: 2.4 mg/dL — ABNORMAL LOW (ref 2.5–4.6)

## 2017-05-11 MED ORDER — ASPIRIN EC 81 MG PO TBEC: 81.0000 mg | DELAYED_RELEASE_TABLET | Freq: Every day | ORAL | 0 refills | Status: AC

## 2017-05-11 MED ORDER — POTASSIUM PHOSPHATE MONOBASIC 500 MG PO TABS
500.0000 mg | ORAL_TABLET | Freq: Three times a day (TID) | ORAL | Status: AC
  Administered 2017-05-11 (×2): 500 mg via ORAL
  Filled 2017-05-11 (×2): qty 1

## 2017-05-11 NOTE — Progress Notes (Signed)
Patient discharged home with family, discharge instructions given and explained to patient and she verbalized understanding, patient denies any pain/distress. No wound noted, skin intact. Accompanied home by family, transported to the car by staff via wheelchair.

## 2017-05-12 NOTE — Discharge Summary (Signed)
Physician Discharge Summary  Ellen [name] RKY:[id number] DOB: Apr 30, 1950 DOA: 05/09/2017  PCP: No primary care provider on file.  Admit date: 05/09/2017 Discharge date: 05/11/2017  Admitted From: Home Disposition: Home  Recommendations for Outpatient Follow-up:  1. Follow up with PCP in 1-2 weeks 2. Follow up with Gastroenterology within 1-2 weeks 3. Follow up with Neurology as an outpatient for evaluation of Sciatica  4. Follow up with Dr. Lindi Adie for Breast Cancer 5. Please obtain CMP/CBC, Mag, Phos in one week 6. Please follow up on the following pending results:  Home Health: No Equipment/Devices: None   Discharge Condition: Stable  CODE STATUS: FULL CODE  Diet recommendation:   Brief/Interim Summary: Ellen [name] a 67 y.o.femalewith medical history significant ofdiverticulosis follows up with Dr. Ardis Hughs as outpatient, left-sided breast cancer status post surgery and radiation completed in 2010, hypertension, hyperlipidemia who presented for persistent bright red blood per rectum since 2 days associated with its nausea, vomiting and crampy abdominal pain.She denies any other complaints. On arrival to ED she had two bloody bowel movements. And her hemoglobin was around 12.6 and dropped to 8.6 but then improved. She reports aspirin use daily and uses pain meds for sciatica. She was referred to medical service for admission for suspected lower GI bleed. She also came to ED three times in the last few weeks for similar complaints. Gastroenterology evaluated and felt that the bleed was self limiting and recommended observing for 24 more hours to see if Hb/Hct remained stable. Hb/Hct initally dropped in the AM but improved in the Afternoon. She was deemed medically stable as there was no more evidence of bleeding. She was advised to hold ASA for 2 weeks and resume enteric coated ASA 81 mg after. Case was discussed with GI and they recommended discharge as she was stable and she will  follow up with Dr. Ardis Hughs as an outpatient. A referral was made to Neurology for her Sciatica. She was deemed Medically stable to D/C Home and will follow up with PCP, Neurology, and Gastroenterology.   Discharge Diagnoses:  Active Problems:   HTN (hypertension)   Hyperlipidemia   Malignant neoplasm of overlapping sites of left female breast ([place])   GI bleed  Bright Red Blood per rectum suspected Lower GI Diverticular Bleed -Admitted for evaluation of lower GI bleeding.  -Initially hypotensive, but now she is normotensive after IV fluids.  -Suspected diverticular bleed.  -Transfuse to keep hemoglobin greater than 9.  -IV PPI 40 mg q12h now Discontinued, IV FLUIDS, GI consult, check serial H&H. -GI recommending advancing diet yesterday as she had no further bleeding and bowel movements; They recommend observing tonight and if no further bleeding and Hb/Hct stable she can go home today -Discontinued Naproxen  -Hb/HCt was 9.3/29.4 and dropped to 8.6/27.1 and then improved to 9.3/28.7 this Afternoon -GI recommending Holding ASA 2 weeks and resuming 81 mg ASA after and repeating CBC 1 week after D/C -Continue to Monitor for S/Sx of Bleeding and advised to call GI if further bleeding   -Repeat CBC as an outpatient -Follow up with PCP and Gastroenterology Dr. Ardis Hughs as an outpatient.   Acute Blood Loss Anemia -Hb/Hct went from 11.5/35.9 -> 12.6/37.0 -> 10.1/31.9 -> 9.6/29.6 -> 9.3/29.7 -> 9.3/29.4 -> 8.6/27.1 -> 9.3/28.7 -? Dilutional Drop from 2 Liter NS boluses and Maintenance IVF at 125 mL/hr; IVF now D/C'd -Continue to Monitor for S/Sx of Bleeding  -Repeat CBC in AM   Back Pain fromSciatica.  -Pain control with IV Morphine  1-2 mg q4hprn, Home Norco, and Acetaminophen -Follow up Neurology as an outpatient for evaluation   Left sided Breast cancers/p surgery and radiation:  -Outpatient follow up with Dr Lindi Adie.  -C/w Arimidex  Hypertension/ Hypotension  -BP now  117/82 -Continue to Monitor BP's  HLD -C/w Pravastatin 40 mg po Daily   Mild AKI from Hypovolemia and Blood Loss -Improved. BUN/Cr now 10/0.82 after IVF Rehydration -Repeat CMP as an outpatient   Discharge Instructions  Discharge Instructions    Ambulatory referral to Neurology   Complete by:  As directed    An appointment is requested in approximately: 1-2 for Sciatica   Call MD for:  difficulty breathing, headache or visual disturbances   Complete by:  As directed    Call MD for:  extreme fatigue   Complete by:  As directed    Call MD for:  hives   Complete by:  As directed    Call MD for:  persistant dizziness or light-headedness   Complete by:  As directed    Call MD for:  persistant nausea and vomiting   Complete by:  As directed    Call MD for:  redness, tenderness, or signs of infection (pain, swelling, redness, odor or green/yellow discharge around incision site)   Complete by:  As directed    Call MD for:  severe uncontrolled pain   Complete by:  As directed    Call MD for:  temperature >100.4   Complete by:  As directed    Diet - low sodium heart healthy   Complete by:  As directed    Discharge instructions   Complete by:  As directed    Follow up with PCP and Gastroenterology as an outpatient. Stop taking 325 mg Aspirin and hold Aspirin for 2 weeks. Resume 81 mg of Aspirin to allow healing to occur. Avoid NSAIDs. Take all medications as prescribed. If symptoms change or worsen please return to the ED for evaluation.   Increase activity slowly   Complete by:  As directed      Allergies as of 05/11/2017      Reactions   Ace Inhibitors    Simvastatin Swelling   lips      Medication List    STOP taking these medications   naproxen 500 MG tablet Commonly known as:  NAPROSYN   oxyCODONE-acetaminophen 5-325 MG tablet Commonly known as:  PERCOCET     TAKE these medications   acetaminophen 325 MG tablet Commonly known as:  TYLENOL Take 2 tablets (650 mg  total) by mouth every 6 (six) hours as needed. Do not take more than 4000mg  of tylenol per day   amLODipine 10 MG tablet Commonly known as:  NORVASC Take 1 tablet (10 mg total) by mouth daily.   anastrozole 1 MG tablet Commonly known as:  ARIMIDEX TAKE 1 TABLET (1 MG TOTAL) BY MOUTH DAILY.   aspirin EC 81 MG tablet Take 1 tablet (81 mg total) by mouth daily. Start taking on:  05/26/2017 What changed:    medication strength  how much to take  These instructions start on 05/26/2017. If you are unsure what to do until then, ask your doctor or other care provider.   calcium-vitamin D 500-200 MG-UNIT tablet Commonly known as:  OSCAL WITH D Take 1 tablet by mouth 2 (two) times daily.   HYDROcodone-acetaminophen 5-325 MG tablet Commonly known as:  NORCO/VICODIN Take 1 tablet by mouth 4 (four) times daily as needed. What changed:  reasons to  take this   metFORMIN 500 MG tablet Commonly known as:  GLUCOPHAGE Take 500 mg by mouth 2 (two) times daily.   pravastatin 40 MG tablet Commonly known as:  PRAVACHOL Take 1 tablet (40 mg total) by mouth daily.   predniSONE 10 MG tablet Commonly known as:  DELTASONE Take 1 tablet (10 mg total) by mouth daily with breakfast. 3 tablets for 3 days, 2 tablets for 3 days, 1 tablet for 3 days.      Follow-up Information    Horald Pollen, MD. Call.   Specialty:  Internal Medicine Why:  Follow up within 1 week Contact information: [place] [address] [phone]        Irene Shipper, MD. Call.   Specialty:  Gastroenterology Why:  Follow up within 1-2 weeks Contact information: [address] [place] [address] [phone]        [facility]. Call.   Specialty:  Neurology Why:  1-2 weeks Contact information: [place] [phone]         Allergies  Allergen Reactions  . Ace Inhibitors   . Simvastatin Swelling    lips    Consultations:  Gastroenterology Dr. Henrene Pastor   Procedures/Studies: Ct Abdomen Pelvis W Contrast  Result Date: 05/09/2017 CLINICAL DATA:  Hematochezia. EXAM: CT ABDOMEN AND PELVIS WITH CONTRAST TECHNIQUE: Multidetector CT imaging of the abdomen and pelvis was performed using the standard protocol following bolus administration of intravenous contrast. CONTRAST:  139mL ISOVUE-300 IOPAMIDOL (ISOVUE-300) INJECTION 61% COMPARISON:  CT abdomen pelvis dated April 22, 2017. FINDINGS: Lower chest: Bilateral breast nodules are unchanged. No acute abnormality. Hepatobiliary: Stable hepatic cysts. No new focal liver abnormality. The gallbladder is unremarkable. No biliary dilatation. Pancreas: Unremarkable. No pancreatic ductal dilatation or surrounding inflammatory changes. Spleen: Normal in size without focal abnormality. Adrenals/Urinary Tract: Adrenal glands are unremarkable. Kidneys are normal, without renal calculi, focal lesion, or hydronephrosis. Bladder is unremarkable. Stomach/Bowel: Small hiatal hernia. The stomach is otherwise within normal limits. Liquid stool throughout the colon. No bowel wall thickening, distention, or surrounding inflammatory changes. Multiple colonic diverticula again noted. Normal appendix. Vascular/Lymphatic: Aortic atherosclerosis. No enlarged abdominal or pelvic lymph nodes. Reproductive: Status post hysterectomy. No adnexal masses. Other: Unchanged small fat containing umbilical hernia. No free fluid or pneumoperitoneum. Musculoskeletal: No acute or significant osseous findings. Moderate right hip osteoarthritis. IMPRESSION: 1.  No acute intra-abdominal process. 2. Scattered colonic diverticulosis without evidence of acute diverticulitis. Liquid stool throughout the colon. 3.  Aortic atherosclerosis (ICD10-I70.0). Electronically Signed   By: Titus Dubin M.D.   On: 05/09/2017 09:10   Ct Abdomen Pelvis W Contrast  Result Date: 04/22/2017 CLINICAL DATA:  Left buttock pain  radiating to the anterior left thigh. Left lower quadrant abdominal pain. History of left breast cancer. EXAM: CT ABDOMEN AND PELVIS WITH CONTRAST TECHNIQUE: Multidetector CT imaging of the abdomen and pelvis was performed using the standard protocol following bolus administration of intravenous contrast. CONTRAST:  159mL ISOVUE-300 IOPAMIDOL (ISOVUE-300) INJECTION 61% COMPARISON:  None. FINDINGS: Lower chest: 1.3 cm oval, circumscribed mass in the upper-outer retroareolar left breast, 1.1 cm oval mass-like density in the medial left breast slightly superiorly, 1.0 cm oval, circumscribed mass in the inferior periareolar left breast and this 0.7 cm oval nodular density in the lower outer left breast. There is also a 0.8 cm oval mass-like density in the lower outer right breast and 1.8 cm oval mass-like density in the 12 o'clock retroareolar  right breast. The patient had a bilateral screening mammogram on 10/14/2016 that demonstrated no interval changes suspicious for malignancy. Hepatobiliary: Multiple liver cysts.  Normal appearing gallbladder. Pancreas: Unremarkable. No pancreatic ductal dilatation or surrounding inflammatory changes. Spleen: Normal in size without focal abnormality. Adrenals/Urinary Tract: Adrenal glands are unremarkable. Kidneys are normal, without renal calculi, focal lesion, or hydronephrosis. Bladder is unremarkable. Stomach/Bowel: Multiple colonic diverticula without evidence of diverticulitis. Unremarkable stomach and small bowel. No evidence of appendicitis. Vascular/Lymphatic: Atheromatous arterial calcifications without aneurysm. No enlarged lymph nodes. Reproductive: Status post hysterectomy. No adnexal masses. Other: Small umbilical hernia containing fat. Musculoskeletal: Lower thoracic spine degenerative changes. Moderate right hip degenerative changes. IMPRESSION: 1. No acute abnormality. 2. Probably benign bilateral breast masses. Correlation with the patient's previous mammograms is  recommended. 3. Colonic diverticulosis. Electronically Signed   By: Claudie Revering M.D.   On: 04/22/2017 21:09     Subjective: Seen and examined and had no pain. No further bowel movements and felt ok. No lightheadedness or dizziness. Patient ready to go home.   Discharge Exam: Vitals:   05/11/17 0500 05/11/17 1536  BP: 120/67 117/82  Pulse: 83 91  Resp: 18 16  Temp: 98.5 F (36.9 C) 99 F (37.2 C)  SpO2: 98% 100%   Vitals:   05/10/17 1325 05/10/17 2100 05/11/17 0500 05/11/17 1536  BP: 119/66 (!) 146/89 120/67 117/82  Pulse: 97 90 83 91  Resp: 20 20 18 16   Temp: 99.3 F (37.4 C) 98.6 F (37 C) 98.5 F (36.9 C) 99 F (37.2 C)  TempSrc: Oral Oral Oral Oral  SpO2: 100% 99% 98% 100%  Weight:      Height:       General: Pt is alert, awake, not in acute distress Cardiovascular: RRR, S1/S2 +, no rubs, no gallops Respiratory: CTA bilaterally, no wheezing, no rhonchi Abdominal: Soft, NT, ND, bowel sounds + Extremities: no edema, no cyanosis  The results of significant diagnostics from this hospitalization (including imaging, microbiology, ancillary and laboratory) are listed below for reference.    Microbiology: No results found for this or any previous visit (from the past 240 hour(s)).   Labs: BNP (last 3 results) No results for input(s): BNP in the last 8760 hours. Basic Metabolic Panel: Recent Labs  Lab 05/09/17 0701 05/09/17 0711 05/11/17 0221  NA 139 142 141  K 4.4 3.6 3.5  CL 103 104 111  CO2 24  --  23  GLUCOSE 175* 176* 115*  BUN 17 18 10   CREATININE 1.15* 0.90 0.82  CALCIUM 8.3*  --  8.1*  MG  --   --  2.1  PHOS  --   --  2.4*   Liver Function Tests: Recent Labs  Lab 05/09/17 0701 05/11/17 0221  AST 23 14*  ALT 9* 12*  ALKPHOS 42 40  BILITOT 0.3 0.3  PROT 6.8 6.0*  ALBUMIN 3.7 3.2*   No results for input(s): LIPASE, AMYLASE in the last 168 hours. No results for input(s): AMMONIA in the last 168 hours. CBC: Recent Labs  Lab 05/09/17 0701   05/10/17 0210 05/10/17 1007 05/10/17 1828 05/11/17 0221 05/11/17 1336  WBC 8.3  --   --   --   --  8.0  --   NEUTROABS 4.5  --   --   --   --  4.7  --   HGB 11.5*   < > 9.6* 9.3* 9.3* 8.6* 9.3*  HCT 35.9*   < > 29.6* 29.7* 29.4* 27.1* 28.7*  MCV 87.6  --   --   --   --  88.0  --   PLT 333  --   --   --   --  284  --    < > = values in this interval not displayed.   Cardiac Enzymes: No results for input(s): CKTOTAL, CKMB, CKMBINDEX, TROPONINI in the last 168 hours. BNP: Invalid input(s): POCBNP CBG: No results for input(s): GLUCAP in the last 168 hours. D-Dimer No results for input(s): DDIMER in the last 72 hours. Hgb A1c No results for input(s): HGBA1C in the last 72 hours. Lipid Profile No results for input(s): CHOL, HDL, LDLCALC, TRIG, CHOLHDL, LDLDIRECT in the last 72 hours. Thyroid function studies No results for input(s): TSH, T4TOTAL, T3FREE, THYROIDAB in the last 72 hours.  Invalid input(s): FREET3 Anemia work up No results for input(s): VITAMINB12, FOLATE, FERRITIN, TIBC, IRON, RETICCTPCT in the last 72 hours. Urinalysis    Component Value Date/Time   COLORURINE STRAW (A) 04/22/2017 1938   APPEARANCEUR CLEAR 04/22/2017 1938   LABSPEC 1.010 05/01/2017 1705   PHURINE 7.5 05/01/2017 1705   GLUCOSEU NEGATIVE 05/01/2017 1705   HGBUR TRACE (A) 05/01/2017 1705   BILIRUBINUR NEGATIVE 05/01/2017 1705   BILIRUBINUR Negative 05/30/2014 1508   KETONESUR NEGATIVE 05/01/2017 1705   PROTEINUR NEGATIVE 05/01/2017 1705   UROBILINOGEN 0.2 05/01/2017 1705   NITRITE NEGATIVE 05/01/2017 1705   LEUKOCYTESUR NEGATIVE 05/01/2017 1705   Sepsis Labs Invalid input(s): PROCALCITONIN,  WBC,  LACTICIDVEN Microbiology No results found for this or any previous visit (from the past 240 hour(s)).  Time coordinating discharge: 35 minutes  SIGNED:  Kerney Elbe, DO [facility] 05/12/2017, 11:30 PM Pager [phone]  If 7PM-7AM, please contact  night-coverage [email or web address] Password [email or web address]

## 2017-05-13 LAB — TYPE AND SCREEN
ABO/RH(D): B POS
ANTIBODY SCREEN: NEGATIVE
PT AG Type: NEGATIVE

## 2017-05-14 ENCOUNTER — Encounter: Payer: Self-pay | Admitting: Physician Assistant

## 2017-05-18 LAB — HIV ANTIBODY (ROUTINE TESTING W REFLEX): HIV SCREEN 4TH GENERATION: NONREACTIVE

## 2017-05-29 ENCOUNTER — Ambulatory Visit: Payer: Medicare HMO | Admitting: Physician Assistant

## 2017-05-29 ENCOUNTER — Encounter: Payer: Self-pay | Admitting: Physician Assistant

## 2017-05-29 ENCOUNTER — Other Ambulatory Visit (INDEPENDENT_AMBULATORY_CARE_PROVIDER_SITE_OTHER): Payer: Medicare HMO

## 2017-05-29 ENCOUNTER — Encounter (INDEPENDENT_AMBULATORY_CARE_PROVIDER_SITE_OTHER): Payer: Self-pay

## 2017-05-29 VITALS — BP 106/64 | HR 72 | Ht 62.0 in | Wt 152.5 lb

## 2017-05-29 DIAGNOSIS — D500 Iron deficiency anemia secondary to blood loss (chronic): Secondary | ICD-10-CM | POA: Diagnosis not present

## 2017-05-29 DIAGNOSIS — Z86010 Personal history of colonic polyps: Secondary | ICD-10-CM | POA: Diagnosis not present

## 2017-05-29 DIAGNOSIS — Z1211 Encounter for screening for malignant neoplasm of colon: Secondary | ICD-10-CM

## 2017-05-29 DIAGNOSIS — Z8719 Personal history of other diseases of the digestive system: Secondary | ICD-10-CM

## 2017-05-29 LAB — CBC WITH DIFFERENTIAL/PLATELET
BASOS ABS: 0.1 10*3/uL (ref 0.0–0.1)
BASOS PCT: 1.6 % (ref 0.0–3.0)
Eosinophils Absolute: 0.2 10*3/uL (ref 0.0–0.7)
Eosinophils Relative: 3 % (ref 0.0–5.0)
HEMATOCRIT: 33.9 % — AB (ref 36.0–46.0)
HEMOGLOBIN: 11.1 g/dL — AB (ref 12.0–15.0)
LYMPHS PCT: 39.6 % (ref 12.0–46.0)
Lymphs Abs: 2.4 10*3/uL (ref 0.7–4.0)
MCHC: 32.8 g/dL (ref 30.0–36.0)
MCV: 87.7 fl (ref 78.0–100.0)
MONO ABS: 0.6 10*3/uL (ref 0.1–1.0)
Monocytes Relative: 10.2 % (ref 3.0–12.0)
NEUTROS ABS: 2.8 10*3/uL (ref 1.4–7.7)
Neutrophils Relative %: 45.6 % (ref 43.0–77.0)
PLATELETS: 493 10*3/uL — AB (ref 150.0–400.0)
RBC: 3.87 Mil/uL (ref 3.87–5.11)
RDW: 15.8 % — AB (ref 11.5–15.5)
WBC: 6.1 10*3/uL (ref 4.0–10.5)

## 2017-05-29 NOTE — Patient Instructions (Addendum)
Please go to the basement level to have your labs drawn.  You will need a follow up Colonoscopy - May of 2020. You will get a letter April of 2020.  Follow up with Amy or Dr. Owens Loffler as needed. If you are age 66 or older, your body mass index should be between 23-30. Your Body mass index is 27.89 kg/m. If this is out of the aforementioned range listed, please consider follow up with your Primary Care Provider.

## 2017-05-29 NOTE — Progress Notes (Signed)
Subjective:    Patient ID: Ellen [name], female    DOB: [birth year], 67 y.o.   MRN: [id number]  HPI Ellen [name] is a pleasant 67 year old African-American female who comes in today for post hospital follow-up.  She is known to Dr. Ardis Hughs from prior colonoscopies. Patient was hospitalized in mid March with an acute lower GI bleed.  She was seen in consultation by GI.  Her bleeding was felt consistent with diverticular bleeding.  Fortunately she stopped shortly after admission, she did not require transfusions and did not undergo repeat colonoscopy. Patient had been taking aspirin 325 mg daily at home for general purposes and had just completed a steroid Dosepak. Last colonoscopy was done in May 2015 per Dr. Ardis Hughs with finding of 2 small polyps biopsies showed benign colonic mucosa no adenomatous change.  She does have previous history of adenomatous colon polyps however.  She was also noted to have left-sided diverticulosis.  She is indicated for 5-year interval follow-up. Patient had a hemoglobin of 9.3 on discharge from the hospital, her nadir was 8.6  She says she has been feeling well over the past couple of weeks.  She has no complaints of abdominal discomfort.  She has not seen any further melena or hematochezia.  She says her energy level has improved and she is feeling fine.  Review of Systems Pertinent positive and negative review of systems were noted in the above HPI section.  All other review of systems was otherwise negative.  Outpatient Encounter Medications as of 05/29/2017  Medication Sig  . acetaminophen (TYLENOL) 325 MG tablet Take 2 tablets (650 mg total) by mouth every 6 (six) hours as needed. Do not take more than 4000mg  of tylenol per day  . amLODipine (NORVASC) 10 MG tablet Take 1 tablet (10 mg total) by mouth daily.  Marland Kitchen anastrozole (ARIMIDEX) 1 MG tablet TAKE 1 TABLET (1 MG TOTAL) BY MOUTH DAILY.  Marland Kitchen aspirin EC 81 MG tablet Take 1 tablet (81 mg total) by mouth daily.  .  calcium-vitamin D (OSCAL WITH D) 500-200 MG-UNIT per tablet Take 1 tablet by mouth 2 (two) times daily.  . metFORMIN (GLUCOPHAGE) 500 MG tablet Take 500 mg by mouth 2 (two) times daily.  . pravastatin (PRAVACHOL) 40 MG tablet Take 1 tablet (40 mg total) by mouth daily.  . [DISCONTINUED] HYDROcodone-acetaminophen (NORCO/VICODIN) 5-325 MG tablet Take 1 tablet by mouth 4 (four) times daily as needed. (Patient taking differently: Take 1 tablet by mouth 4 (four) times daily as needed for moderate pain. )  . [DISCONTINUED] predniSONE (DELTASONE) 10 MG tablet Take 1 tablet (10 mg total) by mouth daily with breakfast. 3 tablets for 3 days, 2 tablets for 3 days, 1 tablet for 3 days. (Patient not taking: Reported on 05/09/2017)   No facility-administered encounter medications on file as of 05/29/2017.    Allergies  Allergen Reactions  . Ace Inhibitors   . Simvastatin Swelling    lips   Patient Active Problem List   Diagnosis Date Noted  . GI bleed 05/09/2017  . Malignant neoplasm of overlapping sites of left female breast ([place]) 06/29/2015  . Dry eye syndrome 06/01/2014  . Eye disease 06/01/2014  . Goiter 04/28/2013  . History of tobacco use 11/05/2011  . Breast cancer of upper-outer quadrant of left female breast ([place]) 11/04/2011  . Obesity (BMI 30-39.9) 04/03/2011  . HTN (hypertension)   . COLONIC POLYPS, ADENOMATOUS, HX OF 05/02/2008  . Hyperlipidemia 12/11/2003    Class: Chronic   Social  History   Socioeconomic History  . Marital status: Divorced    Spouse name: Not on file  . Number of children: 3  . Years of education: Not on file  . Highest education level: Not on file  Occupational History  . Occupation: retired    Comment: Working 2 jobs.  Social Needs  . Financial resource strain: Not on file  . Food insecurity:    Worry: Not on file    Inability: Not on file  . Transportation needs:    Medical: Not on file    Non-medical: Not on file  Tobacco Use  . Smoking status:  Former Smoker    Types: Cigarettes    Last attempt to quit: 02/25/2007    Years since quitting: 10.2  . Smokeless tobacco: Never Used  Substance and Sexual Activity  . Alcohol use: No  . Drug use: No  . Sexual activity: Not Currently  Lifestyle  . Physical activity:    Days per week: Not on file    Minutes per session: Not on file  . Stress: Not on file  Relationships  . Social connections:    Talks on phone: Not on file    Gets together: Not on file    Attends religious service: Not on file    Active member of club or organization: Not on file    Attends meetings of clubs or organizations: Not on file    Relationship status: Not on file  . Intimate partner violence:    Fear of current or ex partner: Not on file    Emotionally abused: Not on file    Physically abused: Not on file    Forced sexual activity: Not on file  Other Topics Concern  . Not on file  Social History Narrative   Widowed. Education: [employer].    Ms. Gambino family history includes Diabetes in her father; Liver cancer in her father; Other in her brother.      Objective:    Vitals:   05/29/17 0954  BP: 106/64  Pulse: 72    Physical Exam; well-developed older African-American female in no acute distress, very pleasant blood pressure 106/64 pulse 72, height 5 foot 2, weight 152, BMI 27.8.  HEENT; nontraumatic normocephalic EOMI PERRLA sclera anicteric, Cardiovascular ;regular rate and rhythm with S1-S2 no murmur rub or gallop, Pulmonary; clear bilaterally, Abdomen; soft, nontender nondistended bowel sounds are active there is no palpable mass or hepatosplenomegaly, Rectal; exam not done, Neuro psych ;mood and affect appropriate       Assessment & Plan:   #46 67 year old African-American female with recent admission for acute diverticular bleed.  Patient comes in today for follow-up and has been doing well since discharge from the hospital with no evidence of recurrent bleeding.  This was her first  diverticular hemorrhage #2 anemia secondary to acute blood loss #3 history of adenomatous colon polyps-up-to-date with colonoscopies last done May 2015 and due for interval follow-up May 2020 #4 hypertension #5.  History of breast cancer  Plan; repeat CBC today Patient was advised when hospitalized that she did not need to take a 325 mg aspirin daily for general prophylaxis.  Advised her today if she wishes to resume aspirin to take a baby aspirin only.  Also advised general avoidance of anti-inflammatories. She will need follow-up colonoscopy in May 2020. She will follow-up with Dr. Ardis Hughs or myself on an as-needed basis in the interim.  Amy S Esterwood PA-C 05/29/2017   Cc: No ref. provider  found

## 2017-05-29 NOTE — Progress Notes (Signed)
I agree with the above note, plan 

## 2017-07-08 ENCOUNTER — Inpatient Hospital Stay: Payer: Medicare HMO | Attending: Hematology and Oncology | Admitting: Hematology and Oncology

## 2017-07-08 ENCOUNTER — Telehealth: Payer: Self-pay | Admitting: Hematology and Oncology

## 2017-07-08 DIAGNOSIS — Z170 Estrogen receptor positive status [ER+]: Secondary | ICD-10-CM | POA: Diagnosis not present

## 2017-07-08 DIAGNOSIS — C50412 Malignant neoplasm of upper-outer quadrant of left female breast: Secondary | ICD-10-CM | POA: Insufficient documentation

## 2017-07-08 DIAGNOSIS — Z79811 Long term (current) use of aromatase inhibitors: Secondary | ICD-10-CM | POA: Insufficient documentation

## 2017-07-08 DIAGNOSIS — M8580 Other specified disorders of bone density and structure, unspecified site: Secondary | ICD-10-CM | POA: Insufficient documentation

## 2017-07-08 MED ORDER — ANASTROZOLE 1 MG PO TABS: ORAL_TABLET | ORAL | 3 refills | Status: DC

## 2017-07-08 NOTE — Progress Notes (Signed)
Patient Care Team: Nolene Ebbs, MD as PCP - General (Internal Medicine) Shirley Muscat Loreen Freud, MD as Referring Physician (Optometry) Marcy Panning, MD as Consulting Physician (Oncology)  DIAGNOSIS:  Encounter Diagnosis  Name Primary?  . Malignant neoplasm of upper-outer quadrant of left breast in female, estrogen receptor positive ([place])     SUMMARY OF ONCOLOGIC HISTORY:   Breast cancer of upper-outer quadrant of left female breast ([place])   09/20/2007 Initial Diagnosis    Left breast mammogram and ultrasound and biopsy of microcalcifications at 3:00, breast MRI 4.4 cm biopsy-proven DCIS, multiple bilateral fibroadenomas      11/08/2007 Surgery    Left breast lumpectomy invasive ductal carcinoma with DCIS grade 2, 1/1 lymph node micrometastases, ER 100%, P 100%, Ki-67 10%, HER-2 negative, T1B N54mc stage IB, Oncotype 10, 7% ROR      01/24/2008 - 03/20/2008 Radiation Therapy    Adjuvant radiation therapy      03/21/2008 -  Anti-estrogen oral therapy    Tamoxifen from January 2010 to August 2012, switched to anastrozole 1 mg daily       CHIEF COMPLIANT: Follow-up on anastrozole therapy  INTERVAL HISTORY: Ellen RIERAis a 67year old with above-mentioned history of breast cancer treated with lumpectomy radiation is currently on anastrozole therapy and appears to be tolerating it very well.  She was recently hospitalized in March 2019 for lower GI bleed and abdominal pain.  It was treated with observation.  She also had sciatica pain and was referred to neurology.  REVIEW OF SYSTEMS:   Constitutional: Denies fevers, chills or abnormal weight loss Eyes: Denies blurriness of vision Ears, nose, mouth, throat, and face: Denies mucositis or sore throat Respiratory: Denies cough, dyspnea or wheezes Cardiovascular: Denies palpitation, chest discomfort Gastrointestinal:  Denies nausea, heartburn or change in bowel habits Skin: Denies abnormal skin rashes Lymphatics: Denies new  lymphadenopathy or easy bruising Neurological:Denies numbness, tingling or new weaknesses Behavioral/Psych: Mood is stable, no new changes  Extremities: No lower extremity edema Breast:  denies any pain or lumps or nodules in either breasts All other systems were reviewed with the patient and are negative.  I have reviewed the past medical history, past surgical history, social history and family history with the patient and they are unchanged from previous note.  ALLERGIES:  is allergic to ace inhibitors and simvastatin.  MEDICATIONS:  Current Outpatient Medications  Medication Sig Dispense Refill  . acetaminophen (TYLENOL) 325 MG tablet Take 2 tablets (650 mg total) by mouth every 6 (six) hours as needed. Do not take more than 40084mof tylenol per day 30 tablet 0  . amLODipine (NORVASC) 10 MG tablet Take 1 tablet (10 mg total) by mouth daily. 90 tablet 3  . anastrozole (ARIMIDEX) 1 MG tablet TAKE 1 TABLET (1 MG TOTAL) BY MOUTH DAILY. 90 tablet 3  . calcium-vitamin D (OSCAL WITH D) 500-200 MG-UNIT per tablet Take 1 tablet by mouth 2 (two) times daily.    . metFORMIN (GLUCOPHAGE) 500 MG tablet Take 500 mg by mouth 2 (two) times daily.  2  . pravastatin (PRAVACHOL) 40 MG tablet Take 1 tablet (40 mg total) by mouth daily. 90 tablet 1   No current facility-administered medications for this visit.     PHYSICAL EXAMINATION: ECOG PERFORMANCE STATUS: 1 - Symptomatic but completely ambulatory  Vitals:   07/08/17 0902  BP: 128/81  Pulse: 73  Resp: 16  Temp: 98.9 F (37.2 C)  SpO2: 99%   Filed Weights   07/08/17 0902  Weight: 152 lb 12.8 oz (69.3 kg)    GENERAL:alert, no distress and comfortable SKIN: skin color, texture, turgor are normal, no rashes or significant lesions EYES: normal, Conjunctiva are pink and non-injected, sclera clear OROPHARYNX:no exudate, no erythema and lips, buccal mucosa, and tongue normal  NECK: supple, thyroid normal size, non-tender, without  nodularity LYMPH:  no palpable lymphadenopathy in the cervical, axillary or inguinal LUNGS: clear to auscultation and percussion with normal breathing effort HEART: regular rate & rhythm and no murmurs and no lower extremity edema ABDOMEN:abdomen soft, non-tender and normal bowel sounds MUSCULOSKELETAL:no cyanosis of digits and no clubbing  NEURO: alert & oriented x 3 with fluent speech, no focal motor/sensory deficits EXTREMITIES: No lower extremity edema BREAST: No palpable masses or nodules in either right or left breasts. No palpable axillary supraclavicular or infraclavicular adenopathy no breast tenderness or nipple discharge. (exam performed in the presence of a chaperone)  LABORATORY DATA:  I have reviewed the data as listed CMP Latest Ref Rng & Units 05/11/2017 05/09/2017 05/09/2017  Glucose 65 - 99 mg/dL 115(H) 176(H) 175(H)  BUN 6 - 20 mg/dL '10 18 17  ' Creatinine 0.44 - 1.00 mg/dL 0.82 0.90 1.15(H)  Sodium 135 - 145 mmol/L 141 142 139  Potassium 3.5 - 5.1 mmol/L 3.5 3.6 4.4  Chloride 101 - 111 mmol/L 111 104 103  CO2 22 - 32 mmol/L 23 - 24  Calcium 8.9 - 10.3 mg/dL 8.1(L) - 8.3(L)  Total Protein 6.5 - 8.1 g/dL 6.0(L) - 6.8  Total Bilirubin 0.3 - 1.2 mg/dL 0.3 - 0.3  Alkaline Phos 38 - 126 U/L 40 - 42  AST 15 - 41 U/L 14(L) - 23  ALT 14 - 54 U/L 12(L) - 9(L)    Lab Results  Component Value Date   WBC 6.1 05/29/2017   HGB 11.1 (L) 05/29/2017   HCT 33.9 (L) 05/29/2017   MCV 87.7 05/29/2017   PLT 493.0 (H) 05/29/2017   NEUTROABS 2.8 05/29/2017    ASSESSMENT & PLAN:  Breast cancer of upper-outer quadrant of left female breast ([place]) 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  Anastrozole toxicities: Tolerating anastrozole extremely well 1. Bone density 06/20/2013 shows very mild osteopenia T score -1.1 in the spine and normal hips: Continue with calcium and vitamin D  Plan is to remain on antiestrogen therapy for 10 years based upon MA 17 clinical trial  Breast Cancer Surveillance: 1. Breast exam 07/08/2017: Normal 2. Mammogram 10/14/2016 No abnormalities. Breast Density Category C  Return to clinic in 1 year for follow-up   No orders of the defined types were placed in this encounter.  The patient has a good understanding of the overall plan. she agrees with it. she will call with any problems that may develop before the next visit here.   Harriette Ohara, MD 07/08/17

## 2017-07-08 NOTE — Telephone Encounter (Signed)
Gave patient AVS and calendar of upcoming may 2020 appointments.

## 2017-07-08 NOTE — Assessment & Plan Note (Signed)
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  Anastrozole toxicities: Tolerating anastrozole extremely well 1. Bone density 06/20/2013 shows very mild osteopenia T score -1.1 in the spine and normal hips: Continue with calcium and vitamin D  Plan is to remain on antiestrogen therapy for 10 years based upon MA 17 clinical trial  Breast Cancer Surveillance: 1. Breast exam 07/08/2017: Normal 2. Mammogram 10/14/2016 No abnormalities. Breast Density Category C  Return to clinic in 1 year for follow-up

## 2017-07-14 ENCOUNTER — Encounter: Payer: Self-pay | Admitting: Neurology

## 2017-07-14 ENCOUNTER — Ambulatory Visit: Payer: Medicare HMO | Admitting: Neurology

## 2017-07-14 DIAGNOSIS — M545 Low back pain, unspecified: Secondary | ICD-10-CM | POA: Insufficient documentation

## 2017-07-14 DIAGNOSIS — M5442 Lumbago with sciatica, left side: Secondary | ICD-10-CM

## 2017-07-14 NOTE — Progress Notes (Signed)
PATIENT: Ellen [name] DOB: [birth year]  Chief Complaint  Patient presents with  . Sciatica    Reports low back pain radiating down left leg and into her posterior thigh.  Symptoms started in March 2019 after lifting a bag of trash.  She is not taking anything for pain right now.  She has taken oxycodone and Prednisone without improvement.  Marland Kitchen PCP    Nolene Ebbs, MD     HISTORICAL  Ellen [name] is a 67 years old female, seen in refer by her primary care doctor Nolene Ebbs for evaluation of low back pain, radiating pain to left leg, initial evaluation was on Jul 14, 2017.  I reviewed and summarized the referring note, she had a history of diabetes, hyperlipidemia, left breast cancer, had lobectomy in 2009, followed by radiation therapy.  She also had a hospital admission in March 2019 for persistent bright red blood per rectum, with nausea, vomiting, abdominal cramping pain, hemoglobin dropped to 8.6, suspected diverticular bleeding, she recovered very well.   On April 18, 2017, she empty a trash can, felt mild straining her back, but denies significant pain at that time, next morning, she developed severe pain starting from the left lower back, radiating to left hip, left posterior thigh, it was so severe," felt like a childbirth," she went to urgent care on April 20, 2017, was diagnosed with left lumbar radiculopathy, was given prescription of Motrin 600 mg, she went to the emergency room again February 27, for severe left side pain, she was given prescription of Percocet, and the prednisone.  She presented to the emergency room third time on May 01, 2017, for left side pain, but this time, the pain moved towards the left groin area, radiating pain to left inner thigh, she felt bruised at both legs.  She developed constipation taking Motrin and Percocet, relied on the over-the-counter stool softener and laxative, on May 09, 2017, after her lower back, lower abdominal pain has  much improved, she noticed the bright red stool, hemoglobin dropped to 8.6, was diagnosed with possible diverticular bleeding, she improved quickly, did not have to have blood transfusion.  CT of abdomen, and pelvic in 2019 showed no acute abnormality chronic diverticulosis  Sagittal view of CT abdomen and pelvic reviewed, no significant lumbar pathology noticed.  She now denies left-sided low back pain, no hip pain, no gait abnormality.  Laboratory evaluation in 2019: Mild anemia hemoglobin of 11.1, CMP showed creatinine of 0.82, mildly low potassium 8.1  REVIEW OF SYSTEMS: Full 14 system review of systems performed and notable only for as above  ALLERGIES: Allergies  Allergen Reactions  . Ace Inhibitors   . Simvastatin Swelling    lips    HOME MEDICATIONS: Current Outpatient Medications  Medication Sig Dispense Refill  . acetaminophen (TYLENOL) 325 MG tablet Take 2 tablets (650 mg total) by mouth every 6 (six) hours as needed. Do not take more than 4000mg  of tylenol per day 30 tablet 0  . amLODipine (NORVASC) 10 MG tablet Take 1 tablet (10 mg total) by mouth daily. 90 tablet 3  . anastrozole (ARIMIDEX) 1 MG tablet TAKE 1 TABLET (1 MG TOTAL) BY MOUTH DAILY. 90 tablet 3  . calcium-vitamin D (OSCAL WITH D) 500-200 MG-UNIT per tablet Take 1 tablet by mouth 2 (two) times daily.    . metFORMIN (GLUCOPHAGE) 500 MG tablet Take 500 mg by mouth 2 (two) times daily.  2  . pravastatin (PRAVACHOL) 40 MG tablet Take 1  tablet (40 mg total) by mouth daily. 90 tablet 1   No current facility-administered medications for this visit.     PAST MEDICAL HISTORY: Past Medical History:  Diagnosis Date  . Breast CA ([place]) 10/2007   S/P Surgery and Radiation Tx completed 03/20/08  . Diabetes ([place])   . HTN (hypertension)   . Hyperlipidemia   . Personal history of radiation therapy 2009  . Thyroid goiter 03/2010   FNA of Left Thyroid Nodule - 05/14/2010    PAST SURGICAL HISTORY: Past Surgical  History:  Procedure Laterality Date  . ABDOMINAL HYSTERECTOMY  1986   Heavy bleeding  . BREAST BIOPSY  12/01/2007   Left axillary sentinal node bx : positive  . BREAST SURGERY  11/08/2007   Left partial mastectomy (lumpectomy)  . COLONOSCOPY  2010    FAMILY HISTORY: Family History  Problem Relation Age of Onset  . Diabetes Father   . Liver cancer Father        Died at 69   . Other Brother        gunshot wound  . Colon cancer Neg Hx     SOCIAL HISTORY:  Social History   Socioeconomic History  . Marital status: Divorced    Spouse name: Not on file  . Number of children: 3  . Years of education: 11th  . Highest education level: Not on file  Occupational History  . Occupation: retired    Comment: Working 2 jobs.  Social Needs  . Financial resource strain: Not on file  . Food insecurity:    Worry: Not on file    Inability: Not on file  . Transportation needs:    Medical: Not on file    Non-medical: Not on file  Tobacco Use  . Smoking status: Former Smoker    Types: Cigarettes    Last attempt to quit: 02/25/2007    Years since quitting: 10.3  . Smokeless tobacco: Never Used  Substance and Sexual Activity  . Alcohol use: No  . Drug use: No  . Sexual activity: Not Currently  Lifestyle  . Physical activity:    Days per week: Not on file    Minutes per session: Not on file  . Stress: Not on file  Relationships  . Social connections:    Talks on phone: Not on file    Gets together: Not on file    Attends religious service: Not on file    Active member of club or organization: Not on file    Attends meetings of clubs or organizations: Not on file    Relationship status: Not on file  . Intimate partner violence:    Fear of current or ex partner: Not on file    Emotionally abused: Not on file    Physically abused: Not on file    Forced sexual activity: Not on file  Other Topics Concern  . Not on file  Social History Narrative   Widowed. Education: [employer]  - 11th grade.   Lives with her daughter and granddaughter.   2 cups caffeine per day.   Right-handed.     PHYSICAL EXAM   Vitals:   07/14/17 1030  Weight: 151 lb 12 oz (68.8 kg)  Height: 5\' 2"  (1.575 m)    Not recorded      Body mass index is 27.76 kg/m.  PHYSICAL EXAMNIATION:  Gen: NAD, conversant, well nourised, obese, well groomed  Cardiovascular: Regular rate rhythm, no peripheral edema, warm, nontender. Eyes: Conjunctivae clear without exudates or hemorrhage Neck: Supple, no carotid bruits. Pulmonary: Clear to auscultation bilaterally   NEUROLOGICAL EXAM:  MENTAL STATUS: Speech:    Speech is normal; fluent and spontaneous with normal comprehension.  Cognition:     Orientation to time, place and person     Normal recent and remote memory     Normal Attention span and concentration     Normal Language, naming, repeating,spontaneous speech     Fund of knowledge   CRANIAL NERVES: CN II: Visual fields are full to confrontation. Fundoscopic exam is normal with sharp discs and no vascular changes. Pupils are round equal and briskly reactive to light. CN III, IV, VI: extraocular movement are normal. No ptosis. CN V: Facial sensation is intact to pinprick in all 3 divisions bilaterally. Corneal responses are intact.  CN VII: Face is symmetric with normal eye closure and smile. CN VIII: Hearing is normal to rubbing fingers CN IX, X: Palate elevates symmetrically. Phonation is normal. CN XI: Head turning and shoulder shrug are intact CN XII: Tongue is midline with normal movements and no atrophy.  MOTOR: There is no pronator drift of out-stretched arms. Muscle bulk and tone are normal. Muscle strength is normal.  REFLEXES: Reflexes are 2+ and symmetric at the biceps, triceps, knees, and 1/1 ankles. Plantar responses are flexor.  SENSORY: Intact to light touch, pinprick, positional sensation and vibratory sensation are intact in fingers and  toes.  COORDINATION: Rapid alternating movements and fine finger movements are intact. There is no dysmetria on finger-to-nose and heel-knee-shin.    GAIT/STANCE: Posture is normal. Gait is steady with normal steps, base, arm swing, and turning. Heel and toe walking are normal. Tandem gait is normal.  Romberg is absent.   DIAGNOSTIC DATA (LABS, IMAGING, TESTING) - I reviewed patient records, labs, notes, testing and imaging myself where available.   ASSESSMENT AND PLAN  Ellen [name] is a 67 y.o. female   Left-sided low back pain, radiating pain to left leg, left groin pain,  Which has improved  Potential etiology including musculoskeletal pain versus left pelvic visceral pain,  No significant abnormality on CT scan,  No further evaluation as needed, call clinic for new issues,    Marcial Pacas, M.D. Ph.D.  [facility] [address], [place], [place] [address] Ph: [phone] Fax: [phone]  CC: Nolene Ebbs, MD

## 2017-07-21 ENCOUNTER — Encounter: Payer: Self-pay | Admitting: Family Medicine

## 2017-08-04 ENCOUNTER — Other Ambulatory Visit: Payer: Self-pay | Admitting: Surgery

## 2017-08-04 DIAGNOSIS — E049 Nontoxic goiter, unspecified: Secondary | ICD-10-CM

## 2017-08-06 NOTE — Telephone Encounter (Signed)
error 

## 2017-08-12 ENCOUNTER — Ambulatory Visit
Admission: RE | Admit: 2017-08-12 | Discharge: 2017-08-12 | Disposition: A | Discharge status: Home / Self Care | Payer: Medicare HMO | Source: Ambulatory Visit | Attending: Surgery | Admitting: Surgery

## 2017-08-12 DIAGNOSIS — E049 Nontoxic goiter, unspecified: Secondary | ICD-10-CM

## 2017-08-21 ENCOUNTER — Other Ambulatory Visit: Payer: Self-pay | Admitting: Surgery

## 2017-08-21 DIAGNOSIS — E041 Nontoxic single thyroid nodule: Secondary | ICD-10-CM

## 2017-09-04 ENCOUNTER — Other Ambulatory Visit: Payer: Self-pay | Admitting: Internal Medicine

## 2017-09-04 DIAGNOSIS — Z1231 Encounter for screening mammogram for malignant neoplasm of breast: Secondary | ICD-10-CM

## 2017-09-09 ENCOUNTER — Ambulatory Visit
Admission: RE | Admit: 2017-09-09 | Discharge: 2017-09-09 | Disposition: A | Discharge status: Home / Self Care | Payer: Medicare HMO | Source: Ambulatory Visit | Attending: Surgery | Admitting: Surgery

## 2017-09-09 ENCOUNTER — Other Ambulatory Visit (HOSPITAL_COMMUNITY)
Admission: RE | Admit: 2017-09-09 | Discharge: 2017-09-09 | Disposition: A | Discharge status: Home / Self Care | Payer: Medicare HMO | Source: Ambulatory Visit | Attending: Radiology | Admitting: Radiology

## 2017-09-09 DIAGNOSIS — E041 Nontoxic single thyroid nodule: Secondary | ICD-10-CM | POA: Insufficient documentation

## 2017-09-22 ENCOUNTER — Other Ambulatory Visit: Payer: Self-pay | Admitting: Internal Medicine

## 2017-09-22 DIAGNOSIS — R1902 Left upper quadrant abdominal swelling, mass and lump: Secondary | ICD-10-CM

## 2017-09-24 ENCOUNTER — Ambulatory Visit
Admission: RE | Admit: 2017-09-24 | Discharge: 2017-09-24 | Disposition: A | Discharge status: Home / Self Care | Payer: Medicare HMO | Source: Ambulatory Visit | Attending: Internal Medicine | Admitting: Internal Medicine

## 2017-09-24 DIAGNOSIS — R1902 Left upper quadrant abdominal swelling, mass and lump: Secondary | ICD-10-CM

## 2017-10-16 ENCOUNTER — Ambulatory Visit
Admission: RE | Admit: 2017-10-16 | Discharge: 2017-10-16 | Disposition: A | Discharge status: Home / Self Care | Payer: Medicare HMO | Source: Ambulatory Visit | Attending: Internal Medicine | Admitting: Internal Medicine

## 2017-10-16 DIAGNOSIS — Z1231 Encounter for screening mammogram for malignant neoplasm of breast: Secondary | ICD-10-CM

## 2017-10-31 ENCOUNTER — Other Ambulatory Visit: Payer: Self-pay

## 2017-10-31 ENCOUNTER — Emergency Department (HOSPITAL_COMMUNITY): Payer: Medicare HMO

## 2017-10-31 ENCOUNTER — Encounter (HOSPITAL_COMMUNITY): Payer: Self-pay

## 2017-10-31 ENCOUNTER — Emergency Department (HOSPITAL_COMMUNITY)
Admission: EM | Admit: 2017-10-31 | Discharge: 2017-10-31 | Disposition: A | Discharge status: Home / Self Care | Payer: Medicare HMO | Attending: Emergency Medicine | Admitting: Emergency Medicine

## 2017-10-31 DIAGNOSIS — E119 Type 2 diabetes mellitus without complications: Secondary | ICD-10-CM | POA: Insufficient documentation

## 2017-10-31 DIAGNOSIS — Z79899 Other long term (current) drug therapy: Secondary | ICD-10-CM | POA: Diagnosis not present

## 2017-10-31 DIAGNOSIS — Z853 Personal history of malignant neoplasm of breast: Secondary | ICD-10-CM | POA: Diagnosis not present

## 2017-10-31 DIAGNOSIS — M25562 Pain in left knee: Secondary | ICD-10-CM

## 2017-10-31 DIAGNOSIS — I10 Essential (primary) hypertension: Secondary | ICD-10-CM | POA: Diagnosis not present

## 2017-10-31 DIAGNOSIS — Z87891 Personal history of nicotine dependence: Secondary | ICD-10-CM | POA: Diagnosis not present

## 2017-10-31 NOTE — ED Notes (Signed)
Ortho paged. 

## 2017-10-31 NOTE — ED Triage Notes (Signed)
Patient states she felt and heard her knee pop when she was using the bathroom last night. Patient has swelling and pain to the left knee.

## 2017-10-31 NOTE — Discharge Instructions (Signed)
You may alternate taking Tylenol and Ibuprofen as needed for pain control. You may take 400-600 mg of ibuprofen every 6 hours and [phone] mg of Tylenol every 6 hours. Do not exceed 4000 mg of Tylenol daily as this can lead to liver damage. Also, make sure to take Ibuprofen with meals as it can cause an upset stomach. Do not take other NSAIDs while taking Ibuprofen such as (Aleve, Naprosyn, Aspirin, Celebrex, etc) and do not take more than the prescribed dose as this can lead to ulcers and bleeding in your GI tract. You may use warm and cold compresses to help with your symptoms.   Please wear the knee immobilizer and use crutches until you can follow-up with the orthopedic doctor that was provided for you on your discharge paperwork.  Please follow up with your primary doctor within the next 7-10 days for re-evaluation and further treatment of your symptoms.   Please return to the ER sooner if you have any new or worsening symptoms.

## 2017-10-31 NOTE — ED Provider Notes (Signed)
[facility] DEPT Provider Note   CSN: [id number] Arrival date & time: 10/31/17  1348     History   Chief Complaint Chief Complaint  Patient presents with  . Knee Pain  . Joint Swelling    HPI Ellen [name] is a 67 y.o. female.  HPI   Patient is a 67 year old female with a history of breast cancer, T2DM, hypertension, hyperlipidemia who presents the emergency department today for evaluation of left knee pain that began suddenly yesterday.  Patient states that she was walking to the bathroom when her left knee turned wrong and she "felt a pop ".  She then had sudden onset of severe left knee pain.  States that she has no pain at rest but pain is severe with movement and ambulation.  She has been able to ambulate without assistance prior to her ED visit today.  No numbness or weakness of the leg.  No fall to the ground.  She took Motrin last night with temporary relief of symptoms.  Past Medical History:  Diagnosis Date  . Breast CA ([place]) 10/2007   S/P Surgery and Radiation Tx completed 03/20/08  . Diabetes ([place])   . HTN (hypertension)   . Hyperlipidemia   . Personal history of radiation therapy 2009  . Thyroid goiter 03/2010   FNA of Left Thyroid Nodule - 05/14/2010    Patient Active Problem List   Diagnosis Date Noted  . Left low back pain 07/14/2017  . GI bleed 05/09/2017  . Malignant neoplasm of overlapping sites of left female breast ([place]) 06/29/2015  . Dry eye syndrome 06/01/2014  . Eye disease 06/01/2014  . Goiter 04/28/2013  . History of tobacco use 11/05/2011  . Breast cancer of upper-outer quadrant of left female breast ([place]) 11/04/2011  . Obesity (BMI 30-39.9) 04/03/2011  . HTN (hypertension)   . COLONIC POLYPS, ADENOMATOUS, HX OF 05/02/2008  . Hyperlipidemia 12/11/2003    Class: Chronic    Past Surgical History:  Procedure Laterality Date  . ABDOMINAL HYSTERECTOMY  1986   Heavy bleeding  . BREAST BIOPSY  12/01/2007   Left  axillary sentinal node bx : positive  . BREAST SURGERY  11/08/2007   Left partial mastectomy (lumpectomy)  . COLONOSCOPY  2010     OB History   None      Home Medications    Prior to Admission medications   Medication Sig Start Date End Date Taking? Authorizing Provider  acetaminophen (TYLENOL) 325 MG tablet Take 2 tablets (650 mg total) by mouth every 6 (six) hours as needed. Do not take more than 4000mg  of tylenol per day 04/20/17   Stacia Feazell S, PA-C  amLODipine (NORVASC) 10 MG tablet Take 1 tablet (10 mg total) by mouth daily. 03/22/16   Horald Pollen, MD  anastrozole (ARIMIDEX) 1 MG tablet TAKE 1 TABLET (1 MG TOTAL) BY MOUTH DAILY. 07/08/17   Nicholas Lose, MD  calcium-vitamin D (OSCAL WITH D) 500-200 MG-UNIT per tablet Take 1 tablet by mouth 2 (two) times daily.    [provider]  metFORMIN (GLUCOPHAGE) 500 MG tablet Take 500 mg by mouth 2 (two) times daily. 03/24/17   [provider]  pravastatin (PRAVACHOL) 40 MG tablet Take 1 tablet (40 mg total) by mouth daily. 12/13/15   Elby Beck, FNP    Family History Family History  Problem Relation Age of Onset  . Diabetes Father   . Liver cancer Father  Died at 70   . Other Brother        gunshot wound  . Colon cancer Neg Hx     Social History Social History   Tobacco Use  . Smoking status: Former Smoker    Types: Cigarettes    Last attempt to quit: 02/25/2007    Years since quitting: 10.6  . Smokeless tobacco: Never Used  Substance Use Topics  . Alcohol use: No  . Drug use: No     Allergies   Ace inhibitors and Simvastatin   Review of Systems Review of Systems  Constitutional: Negative for fever.  Eyes: Negative for visual disturbance.  Respiratory: Negative for shortness of breath.   Cardiovascular: Negative for chest pain.  Gastrointestinal: Negative for abdominal pain.  Genitourinary: Negative for pelvic pain.  Musculoskeletal:       Left knee pain/swelling      Physical Exam Updated Vital Signs BP (!) 146/89 (BP Location: Right Arm)   Pulse 95   Temp 98.8 F (37.1 C) (Oral)   Resp 18   Ht 5\' 2"  (1.575 m)   Wt 69.4 kg   SpO2 100%   BMI 27.98 kg/m   Physical Exam  Constitutional: She appears well-developed and well-nourished. No distress.  HENT:  Head: Normocephalic and atraumatic.  Eyes: Conjunctivae are normal.  Neck: Neck supple.  Cardiovascular: Normal rate.  Pulmonary/Chest: Effort normal.  Musculoskeletal:  TTP and swelling to the left knee. Most tender over the patella and the lateral joint line. No laxity with varus/valgus stress. Negative anterior/posterior drawer testing. Effusion to the left knee noted. No erythema or warmth noted. Decreased ROM due to pain/swelling to the left knee. No ttp to the tib/fib or throughout the ankle. NVI.  Neurological: She is alert.  Skin: Skin is warm and dry. Capillary refill takes less than 2 seconds.  Psychiatric: She has a normal mood and affect.  Nursing note and vitals reviewed.  ED Treatments / Results  Labs (all labs ordered are listed, but only abnormal results are displayed) Labs Reviewed - No data to display  EKG None  Radiology Dg Knee Complete 4 Views Left  Result Date: 10/31/2017 CLINICAL DATA:  67 year old female with a history of knee popping sensation EXAM: LEFT KNEE - COMPLETE 4+ VIEW COMPARISON:  None. FINDINGS: No acute displaced fracture identified. Large joint effusion. Degenerative changes of mediolateral compartment, as well as the patellofemoral compartment. No radiopaque foreign body. IMPRESSION: Negative for acute bony abnormality. Large joint effusion of the left knee. Osteoarthritis. Electronically Signed   By: Corrie Mckusick D.O.   On: 10/31/2017 15:22    Procedures Procedures (including critical care time) SPLINT APPLICATION Date/Time: 0:62 PM Authorized by: Rodney Booze Consent: Verbal consent obtained. Risks and benefits: risks, benefits and  alternatives were discussed Consent given by: patient Splint applied by: orthopedic technician Location details: LLE Splint type: knee immobilizer Post-procedure: The splinted body part was neurovascularly unchanged following the procedure. Patient tolerance: Patient tolerated the procedure well with no immediate complications.  Medications Ordered in ED Medications - No data to display   Initial Impression / Assessment and Plan / ED Course  I have reviewed the triage vital signs and the nursing notes.  Pertinent labs & imaging results that were available during my care of the patient were reviewed by me and considered in my medical decision making (see chart for details).    Discussed pt presentation and exam findings with Dr. Sedonia Small, who evaluated pt and agrees with the  plan.  Final Clinical Impressions(s) / ED Diagnoses   Final diagnoses:  Acute pain of left knee   Pt with mild swelling to the joint spaces, knee swelling, tightness in the knee, and mildly restricted range of motion after mechanical injury of the occurred last night. Pt unable to perform full flexion of the knee.  Pt is without systemic symptoms, erythema or redness of the joint consistent with gout or septic joint.  Patient X-Ray negative for obvious fracture or dislocation.  Will give knee immobilizer and crutches.  Pt advised to follow up with orthopedics if symptoms persist for further evaluation and treatment. Patient given brace while in ED, conservative therapy recommended and discussed. Patient will be dc home & is agreeable with above plan.  ED Discharge Orders    None       Rodney Booze, [place] 10/31/17 1555    Maudie Flakes, MD 10/31/17 [phone]

## 2017-11-06 ENCOUNTER — Encounter (INDEPENDENT_AMBULATORY_CARE_PROVIDER_SITE_OTHER): Payer: Self-pay | Admitting: Orthopaedic Surgery

## 2017-11-06 ENCOUNTER — Ambulatory Visit (INDEPENDENT_AMBULATORY_CARE_PROVIDER_SITE_OTHER): Payer: Medicare HMO | Admitting: Orthopaedic Surgery

## 2017-11-06 VITALS — BP 157/98 | HR 91 | Ht 62.0 in | Wt 153.0 lb

## 2017-11-06 DIAGNOSIS — M25062 Hemarthrosis, left knee: Secondary | ICD-10-CM | POA: Diagnosis not present

## 2017-11-06 MED ORDER — LIDOCAINE HCL 1 % IJ SOLN
0.5000 mL | INTRAMUSCULAR | Status: AC | PRN
  Administered 2017-11-06: .5 mL

## 2017-11-06 NOTE — Progress Notes (Signed)
Office Visit Note   Patient: Ellen [name]           Date of Birth: 11-07-1950           MRN: [id number] Visit Date: 11/06/2017              Requested by: Nolene Ebbs, MD [address] [place], [place] [address] PCP: Nolene Ebbs, MD   Assessment & Plan: Visit Diagnoses:  1. Hemarthrosis of left knee     Plan: Knee aspirated 30 cc of serosanguineous fluid the color of rose.  It was not all blood.  X-rays emergency room negative for acute fracture but she did have large joint effusion and degenerative changes medial and lateral compartment and patellofemoral compartment mild to moderate.  Patient will work on straight leg raising, quad contractures, gentle knee range of motion use her knee immobilizer when she is up as well as walker and check her back again in 2 weeks for repeat exam.  Follow-Up Instructions: Return in about 2 weeks (around 11/20/2017).   Orders:  No orders of the defined types were placed in this encounter.  No orders of the defined types were placed in this encounter.     Procedures: Large Joint Inj: L knee on 11/06/2017 2:17 PM Indications: joint swelling and pain Details: 22 G 1.5 in needle, anterolateral approach  Arthrogram: No  Medications: 0.5 mL lidocaine 1 % Aspirate: 30 mL serous and blood-tinged Outcome: tolerated well, no immediate complications Procedure, treatment alternatives, risks and benefits explained, specific risks discussed. Consent was given by the patient. Immediately prior to procedure a time out was called to verify the correct patient, procedure, equipment, support staff and site/side marked as required. Patient was prepped and draped in the usual sterile fashion.       Clinical Data: No additional findings.   Subjective: Chief Complaint  Patient presents with  . Left Knee - Pain    HPI 67 year old female states she was walking to the bathroom was turning around suddenly she felt sharp painful pop in her left  knee with sudden severe pain.  She had near immediate swelling.  One past history of similar injury greater than 10 years ago.  She has been in a knee immobilizer is not able to do a straight leg raise.  She did not actually hit the ground was able to grab hold with her arms and avoid falling.  Review of Systems positive for right breast cancer 2009 status post surgery radiation.  Positive for hypertension, hyperlipidemia thyroid goiter, previous hysterectomy, routine colonoscopy.  Positive for diverticulosis.  Otherwise negative as it pertains HPI 14 point review of systems updated.   Objective: Vital Signs: BP (!) 157/98   Pulse 91   Ht 5\' 2"  (1.575 m)   Wt 153 lb (69.4 kg)   BMI 27.98 kg/m   Physical Exam  Constitutional: She is oriented to person, place, and time. She appears well-developed.  HENT:  Head: Normocephalic.  Right Ear: External ear normal.  Left Ear: External ear normal.  Eyes: Pupils are equal, round, and reactive to light.  Neck: No tracheal deviation present. No thyromegaly present.  Cardiovascular: Normal rate.  Pulmonary/Chest: Effort normal.  Abdominal: Soft.  Neurological: She is alert and oriented to person, place, and time.  Skin: Skin is warm and dry.  Psychiatric: She has a normal mood and affect. Her behavior is normal.    Ortho Exam patient has dense knee arthrosis is unable to do a straight leg  raise.  Tenderness about the patella.  Collateral ligaments are stable.  Patellar tendon is in satisfactory position with hypermobility medial and lateral.  Some tenderness over the medial patellofemoral ligament.  Distal pulses are intact sensation her foot ankle dorsiflexion plantarflexion is normal.  No Baker's cyst noted.  Specialty Comments:  No specialty comments available.  Imaging: No results found.   PMFS History: Patient Active Problem List   Diagnosis Date Noted  . Left low back pain 07/14/2017  . GI bleed 05/09/2017  . Malignant neoplasm of  overlapping sites of left female breast ([place]) 06/29/2015  . Dry eye syndrome 06/01/2014  . Eye disease 06/01/2014  . Goiter 04/28/2013  . History of tobacco use 11/05/2011  . Breast cancer of upper-outer quadrant of left female breast ([place]) 11/04/2011  . Obesity (BMI 30-39.9) 04/03/2011  . HTN (hypertension)   . COLONIC POLYPS, ADENOMATOUS, HX OF 05/02/2008  . Hyperlipidemia 12/11/2003    Class: Chronic   Past Medical History:  Diagnosis Date  . Breast CA ([place]) 10/2007   S/P Surgery and Radiation Tx completed 03/20/08  . Diabetes ([place])   . HTN (hypertension)   . Hyperlipidemia   . Personal history of radiation therapy 2009  . Thyroid goiter 03/2010   FNA of Left Thyroid Nodule - 05/14/2010    Family History  Problem Relation Age of Onset  . Diabetes Father   . Liver cancer Father        Died at 27   . Other Brother        gunshot wound  . Colon cancer Neg Hx     Past Surgical History:  Procedure Laterality Date  . ABDOMINAL HYSTERECTOMY  1986   Heavy bleeding  . BREAST BIOPSY  12/01/2007   Left axillary sentinal node bx : positive  . BREAST SURGERY  11/08/2007   Left partial mastectomy (lumpectomy)  . COLONOSCOPY  2010   Social History   Occupational History  . Occupation: retired    Comment: Working 2 jobs.  Tobacco Use  . Smoking status: Former Smoker    Types: Cigarettes    Last attempt to quit: 02/25/2007    Years since quitting: 10.7  . Smokeless tobacco: Never Used  Substance and Sexual Activity  . Alcohol use: No  . Drug use: No  . Sexual activity: Not Currently

## 2017-11-20 ENCOUNTER — Ambulatory Visit (INDEPENDENT_AMBULATORY_CARE_PROVIDER_SITE_OTHER): Payer: Medicare HMO | Admitting: Orthopaedic Surgery

## 2017-11-20 ENCOUNTER — Encounter (INDEPENDENT_AMBULATORY_CARE_PROVIDER_SITE_OTHER): Payer: Self-pay | Admitting: Orthopaedic Surgery

## 2017-11-20 VITALS — BP 149/83 | HR 61 | Ht 62.0 in | Wt 153.0 lb

## 2017-11-20 DIAGNOSIS — M25062 Hemarthrosis, left knee: Secondary | ICD-10-CM | POA: Diagnosis not present

## 2017-11-20 NOTE — Progress Notes (Signed)
Office Visit Note   Patient: Ellen [name]           Date of Birth: 09-29-1950           MRN: [id number] Visit Date: 11/20/2017              Requested by: Nolene Ebbs, MD [address] [place], [place] [address] PCP: Nolene Ebbs, MD   Assessment & Plan: Visit Diagnoses:  1. Hemarthrosis of left knee     Plan: Hemarthrosis has resolved 75%.  She is walking better symptoms are improved.  If she has increase in symptoms she will call let us know we will consider diagnostic MRI scan of her knee.  Currently she is walking much better than she was post knee aspiration.  Follow-up PRN.  Follow-Up Instructions: Return if symptoms worsen or fail to improve.   Orders:  No orders of the defined types were placed in this encounter.  No orders of the defined types were placed in this encounter.     Procedures: No procedures performed   Clinical Data: No additional findings.   Subjective: Chief Complaint  Patient presents with  . Left Knee - Pain, Follow-up    HPI 67 year old female returns for follow-up post twisting of her knee with aspiration 11/06/2017.  Since that time she is been gradually getting better.  She got rid of the knee immobilizer she still has mild swelling in her knee she is doing straight leg raising exercises and working on flexion but has not had any locking.  No giving way symptoms.  Denies fever chills no other joint involvement.  Review of Systems 14 point system update unchanged from 10/30/2017 office visit.   Objective: Vital Signs: BP (!) 149/83   Pulse 61   Ht 5\' 2"  (1.575 m)   Wt 153 lb (69.4 kg)   BMI 27.98 kg/m   Physical Exam  Constitutional: She is oriented to person, place, and time. She appears well-developed.  HENT:  Head: Normocephalic.  Right Ear: External ear normal.  Left Ear: External ear normal.  Eyes: Pupils are equal, round, and reactive to light.  Patient wears glasses  Neck: No tracheal deviation present. No  thyromegaly present.  Cardiovascular: Normal rate.  Pulmonary/Chest: Effort normal.  Abdominal: Soft.  Neurological: She is alert and oriented to person, place, and time.  Skin: Skin is warm and dry.  Psychiatric: She has a normal mood and affect. Her behavior is normal.    Ortho Exam 0 to 120 degrees range of motion with mild patellofemoral crepitus.  Collateral ligaments are stable ACL PCL exam is symmetrical negative pivot shift no pes bursa tenderness no Baker's cyst no pain with internal/external rotation of her hips.  Quads are strong.  She is amatory without knee limp.  Slight tenderness medial lateral joint line as well as around the patella less than 10/30/2017 office visit.  Specialty Comments:  No specialty comments available.  Imaging: No results found.   PMFS History: Patient Active Problem List   Diagnosis Date Noted  . Hemarthrosis of left knee 11/06/2017  . Left low back pain 07/14/2017  . GI bleed 05/09/2017  . Malignant neoplasm of overlapping sites of left female breast ([place]) 06/29/2015  . Dry eye syndrome 06/01/2014  . Eye disease 06/01/2014  . Goiter 04/28/2013  . History of tobacco use 11/05/2011  . Breast cancer of upper-outer quadrant of left female breast ([place]) 11/04/2011  . Obesity (BMI 30-39.9) 04/03/2011  . HTN (hypertension)   .  COLONIC POLYPS, ADENOMATOUS, HX OF 05/02/2008  . Hyperlipidemia 12/11/2003    Class: Chronic   Past Medical History:  Diagnosis Date  . Breast CA ([place]) 10/2007   S/P Surgery and Radiation Tx completed 03/20/08  . Diabetes ([place])   . HTN (hypertension)   . Hyperlipidemia   . Personal history of radiation therapy 2009  . Thyroid goiter 03/2010   FNA of Left Thyroid Nodule - 05/14/2010    Family History  Problem Relation Age of Onset  . Diabetes Father   . Liver cancer Father        Died at 60   . Other Brother        gunshot wound  . Colon cancer Neg Hx     Past Surgical History:  Procedure Laterality Date  .  ABDOMINAL HYSTERECTOMY  1986   Heavy bleeding  . BREAST BIOPSY  12/01/2007   Left axillary sentinal node bx : positive  . BREAST SURGERY  11/08/2007   Left partial mastectomy (lumpectomy)  . COLONOSCOPY  2010   Social History   Occupational History  . Occupation: retired    Comment: Working 2 jobs.  Tobacco Use  . Smoking status: Former Smoker    Types: Cigarettes    Last attempt to quit: 02/25/2007    Years since quitting: 10.7  . Smokeless tobacco: Never Used  Substance and Sexual Activity  . Alcohol use: No  . Drug use: No  . Sexual activity: Not Currently

## 2017-12-18 ENCOUNTER — Ambulatory Visit (INDEPENDENT_AMBULATORY_CARE_PROVIDER_SITE_OTHER): Payer: Medicare HMO | Admitting: Orthopaedic Surgery

## 2018-03-18 ENCOUNTER — Other Ambulatory Visit: Payer: Self-pay | Admitting: Surgery

## 2018-03-18 DIAGNOSIS — E049 Nontoxic goiter, unspecified: Secondary | ICD-10-CM

## 2018-03-19 ENCOUNTER — Other Ambulatory Visit: Payer: Medicare HMO

## 2018-03-19 ENCOUNTER — Ambulatory Visit
Admission: RE | Admit: 2018-03-19 | Discharge: 2018-03-19 | Disposition: A | Discharge status: Home / Self Care | Payer: Medicare Other | Source: Ambulatory Visit | Attending: Surgery | Admitting: Surgery

## 2018-03-19 DIAGNOSIS — E049 Nontoxic goiter, unspecified: Secondary | ICD-10-CM

## 2018-07-05 NOTE — Assessment & Plan Note (Signed)
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  Anastrozole toxicities: Tolerating anastrozole extremely well 1. Bone density 06/20/2013 shows very mild osteopenia T score -1.1 in the spine and normal hips: Continue with calcium and vitamin D I discussed with her that she completed 10 years of therapy.  Antiestrogen therapy benefits beyond 10 years have not been shown to have any benefit by clinical trials.  Therefore recommended discontinuation of antiestrogen therapy.   Breast Cancer Surveillance: 1. Breast exam 07/08/2017: Normal 2. Mammogram8/23/2019 No abnormalities. Breast Density Category C  Return to clinic in 1 year for follow-up with long-term survivorship.

## 2018-07-07 ENCOUNTER — Telehealth: Payer: Self-pay | Admitting: Hematology and Oncology

## 2018-07-07 NOTE — Telephone Encounter (Signed)
Called regarding upcoming Webex appointment, patient does not have access for Webex and needs this to be a telephone visit. °

## 2018-07-08 NOTE — Progress Notes (Signed)
  HEMATOLOGY-ONCOLOGY TELEPHONE VISIT PROGRESS NOTE  I connected with Ellen [name] on 07/09/2018 at  9:00 AM EDT by telephone and verified that I am speaking with the correct person using two identifiers.  I discussed the limitations, risks, security and privacy concerns of performing an evaluation and management service by telephone and the availability of in person appointments.  I also discussed with the patient that there may be a patient responsible charge related to this service. The patient expressed understanding and agreed to proceed.   History of Present Illness: Ellen [name] is a 68 y.o. female with above-mentioned history of left breast cancer treated with lumpectomy, radiation, and who is currently on anastrozole therapy. I last saw her a year ago. Her most recent mammogram on 10/16/17 showed no evidence of malignancy. She is over the phone today for annual follow-up.  She has tolerated anastrozole extremely well without any side effects.  Her last bone density was in 2018 which showed a T score of -0.8. She denies any lumps or nodules in the breast.    Breast cancer of upper-outer quadrant of left female breast ([place])   09/20/2007 Initial Diagnosis    Left breast mammogram and ultrasound and biopsy of microcalcifications at 3:00, breast MRI 4.4 cm biopsy-proven DCIS, multiple bilateral fibroadenomas    11/08/2007 Surgery    Left breast lumpectomy invasive ductal carcinoma with DCIS grade 2, 1/1 lymph node micrometastases, ER 100%, P 100%, Ki-67 10%, HER-2 negative, T1B N38mc stage IB, Oncotype 10, 7% ROR    01/24/2008 - 03/20/2008 Radiation Therapy    Adjuvant radiation therapy    03/21/2008 -  Anti-estrogen oral therapy    Tamoxifen from January 2010 to August 2012, switched to anastrozole 1 mg daily     Observations/Objective:  No clinical findings of relapse   Assessment Plan:  [place] upper-[place] quadrant [place] breast invasive ductal  carcinoma with DCIS: T1b N1 mic M0 stage IB grade 2, 1/1 Vicryl met lymph node, ER 100%, PR 100%, HER-2 negative, Ki-67 10%, Oncotype score 10, 7% risk of recurrence, status post lumpectomy 11/08/2007 followed by radiation therapy completed 03/20/2008 followed by antiestrogen therapy started January 2010, currently on anastrozole 1 mg daily completed 07/09/2018.  Anastrozole toxicities: Tolerating anastrozole extremely well 1. Bone density 06/20/2013 shows very mild osteopenia T score -1.1 in the spine and normal hips: Continue with calcium and vitamin D I discussed with her that she completed 10 years of therapy.  Antiestrogen therapy benefits beyond 10 years have not been shown to have any benefit by clinical trials.  Therefore recommended discontinuation of antiestrogen therapy. Patient agreed to stop it  Breast Cancer Surveillance: 1. Breast exam 07/08/2017: Normal 2. Mammogram8/23/2019 No abnormalities. Breast Density Category C  Return to clinic in 1 year for follow-up.  I discussed the assessment and treatment plan with the patient. The patient was provided an opportunity to ask questions and all were answered. The patient agreed with the plan and demonstrated an understanding of the instructions. The patient was advised to call back or seek an in-person evaluation if the symptoms worsen or if the condition fails to improve as anticipated.   I provided 12 minutes of non-face-to-face time during this encounter.   VRulon Eisenmenger MD 07/09/2018    I, Molly Dorshimer, am acting as scribe for VNicholas Lose MD.  I have reviewed the above documentation for accuracy and completeness, and I agree with the above.

## 2018-07-09 ENCOUNTER — Encounter: Payer: Self-pay | Admitting: Gastroenterology

## 2018-07-09 ENCOUNTER — Inpatient Hospital Stay: Payer: Medicare Other | Attending: Hematology and Oncology | Admitting: Hematology and Oncology

## 2018-07-09 DIAGNOSIS — Z170 Estrogen receptor positive status [ER+]: Secondary | ICD-10-CM | POA: Diagnosis not present

## 2018-07-09 DIAGNOSIS — Z923 Personal history of irradiation: Secondary | ICD-10-CM | POA: Diagnosis not present

## 2018-07-09 DIAGNOSIS — C50412 Malignant neoplasm of upper-outer quadrant of left female breast: Secondary | ICD-10-CM

## 2018-07-22 ENCOUNTER — Ambulatory Visit (AMBULATORY_SURGERY_CENTER): Payer: Self-pay

## 2018-07-22 ENCOUNTER — Other Ambulatory Visit: Payer: Self-pay

## 2018-07-22 ENCOUNTER — Encounter: Payer: Self-pay | Admitting: Gastroenterology

## 2018-07-22 VITALS — Ht 62.0 in | Wt 154.0 lb

## 2018-07-22 DIAGNOSIS — Z86010 Personal history of colonic polyps: Secondary | ICD-10-CM

## 2018-07-22 MED ORDER — PEG 3350-KCL-NA BICARB-NACL 420 G PO SOLR: 4000.0000 mL | Freq: Once | ORAL | 0 refills | Status: AC

## 2018-07-22 NOTE — Progress Notes (Signed)
Denies allergies to eggs or soy products. Denies complication of anesthesia or sedation. Denies use of weight loss medication. Denies use of O2.   Emmi instructions given for colonoscopy.  Pre-Visit was conducted by phone due to Covid 19. Instructions were reviewed with patient and mailed to her confirmed home address. Patient was encouraged to call if she had any questions or concerns regarding instructions.

## 2018-08-03 ENCOUNTER — Other Ambulatory Visit: Payer: Self-pay | Admitting: Internal Medicine

## 2018-08-03 DIAGNOSIS — E2839 Other primary ovarian failure: Secondary | ICD-10-CM

## 2018-08-04 ENCOUNTER — Telehealth: Payer: Self-pay | Admitting: *Deleted

## 2018-08-04 NOTE — Telephone Encounter (Signed)

## 2018-08-05 ENCOUNTER — Telehealth: Payer: Self-pay | Admitting: Gastroenterology

## 2018-08-05 NOTE — Telephone Encounter (Signed)
Patient called and said she has eaten 3 bowls of red jello. Asked MD up here and he said that was ok but not to eat anymore. Relayed this info to patient.

## 2018-08-06 ENCOUNTER — Encounter: Payer: Self-pay | Admitting: Gastroenterology

## 2018-08-06 ENCOUNTER — Ambulatory Visit (AMBULATORY_SURGERY_CENTER): Payer: Medicare Other | Admitting: Gastroenterology

## 2018-08-06 ENCOUNTER — Other Ambulatory Visit: Payer: Self-pay

## 2018-08-06 VITALS — BP 110/57 | HR 57 | Temp 98.7°F | Resp 19 | Ht 63.0 in | Wt 154.0 lb

## 2018-08-06 DIAGNOSIS — Z86010 Personal history of colonic polyps: Secondary | ICD-10-CM

## 2018-08-06 DIAGNOSIS — K5730 Diverticulosis of large intestine without perforation or abscess without bleeding: Secondary | ICD-10-CM

## 2018-08-06 MED ORDER — SODIUM CHLORIDE 0.9 % IV SOLN: 500.0000 mL | Freq: Once | INTRAVENOUS | Status: DC

## 2018-08-06 NOTE — Op Note (Signed)
[facility] Patient Name: Amarii Amy Procedure Date: 08/06/2018 10:39 AM MRN: [id number] Endoscopist: Milus Banister , MD Age: 68 Referring MD:  Date of Birth: Feb 20, 1951 Gender: Female Account #: [id number] Procedure:                Colonoscopy Indications:              High risk colon cancer surveillance: Personal                            history of colonic polyps Colonoscopy 2007 three                            adenomatous polyps 2007; colonoscoipy 2010 no                            polyps; colonoscopy 2015 two polps, not adenomatous Medicines:                Monitored Anesthesia Care Procedure:                Pre-Anesthesia Assessment:                           - Prior to the procedure, a History and Physical                            was performed, and patient medications and                            allergies were reviewed. The patient's tolerance of                            previous anesthesia was also reviewed. The risks                            and benefits of the procedure and the sedation                            options and risks were discussed with the patient.                            All questions were answered, and informed consent                            was obtained. Prior Anticoagulants: The patient has                            taken no previous anticoagulant or antiplatelet                            agents. ASA Grade Assessment: II - A patient with                            mild systemic disease. After reviewing the risks  and benefits, the patient was deemed in                            satisfactory condition to undergo the procedure.                           After obtaining informed consent, the colonoscope                            was passed under direct vision. Throughout the                            procedure, the patient's blood pressure, pulse, and                            oxygen saturations  were monitored continuously. The                            Colonoscope was introduced through the anus and                            advanced to the the cecum, identified by                            appendiceal orifice and ileocecal valve. The                            colonoscopy was performed without difficulty. The                            patient tolerated the procedure well. The quality                            of the bowel preparation was good. The ileocecal                            valve, appendiceal orifice, and rectum were                            photographed. Scope In: 10:42:57 AM Scope Out: 10:50:17 AM Scope Withdrawal Time: 0 hours 5 minutes 46 seconds  Total Procedure Duration: 0 hours 7 minutes 20 seconds  Findings:                 Multiple small and large-mouthed diverticula were                            found in the left colon.                           The exam was otherwise without abnormality on                            direct and retroflexion views. Complications:            No immediate complications.  Estimated blood loss:                            None. Estimated Blood Loss:     Estimated blood loss: none. Impression:               - Diverticulosis in the left colon.                           - The examination was otherwise normal on direct                            and retroflexion views.                           - No polyps or cancers. Recommendation:           - Patient has a contact number available for                            emergencies. The signs and symptoms of potential                            delayed complications were discussed with the                            patient. Return to normal activities tomorrow.                            Written discharge instructions were provided to the                            patient.                           - Resume previous diet.                           - Continue present  medications.                           - Repeat colonoscopy in 5 years. Milus Banister, MD 08/06/2018 10:53:49 AM This report has been signed electronically.

## 2018-08-06 NOTE — Patient Instructions (Signed)
YOU HAD AN ENDOSCOPIC PROCEDURE TODAY AT THE [facility]:   Refer to the procedure report that was given to you for any specific questions about what was found during the examination.  If the procedure report does not answer your questions, please call your gastroenterologist to clarify.  If you requested that your care partner not be given the details of your procedure findings, then the procedure report has been included in a sealed envelope for you to review at your convenience later.  YOU SHOULD EXPECT: Some feelings of bloating in the abdomen. Passage of more gas than usual.  Walking can help get rid of the air that was put into your GI tract during the procedure and reduce the bloating. If you had a lower endoscopy (such as a colonoscopy or flexible sigmoidoscopy) you may notice spotting of blood in your stool or on the toilet paper. If you underwent a bowel prep for your procedure, you may not have a normal bowel movement for a few days.  Please Note:  You might notice some irritation and congestion in your nose or some drainage.  This is from the oxygen used during your procedure.  There is no need for concern and it should clear up in a day or so.  SYMPTOMS TO REPORT IMMEDIATELY:   Following lower endoscopy (colonoscopy or flexible sigmoidoscopy):  Excessive amounts of blood in the stool  Significant tenderness or worsening of abdominal pains  Swelling of the abdomen that is new, acute  Fever of 100F or higher  For urgent or emergent issues, a gastroenterologist can be reached at any hour by calling [phone].   DIET:  We do recommend a small meal at first, but then you may proceed to your regular diet.  Drink plenty of fluids but you should avoid alcoholic beverages for 24 hours.  ACTIVITY:  You should plan to take it easy for the rest of today and you should NOT DRIVE or use heavy machinery until tomorrow (because of the sedation medicines used during the test).     FOLLOW UP: Our staff will call the number listed on your records 48-72 hours following your procedure to check on you and address any questions or concerns that you may have regarding the information given to you following your procedure. If we do not reach you, we will leave a message.  We will attempt to reach you two times.  During this call, we will ask if you have developed any symptoms of COVID 19. If you develop any symptoms (ie: fever, flu-like symptoms, shortness of breath, cough etc.) before then, please call [phone].  If you test positive for Covid 19 in the 2 weeks post procedure, please call and report this information to us.    If any biopsies were taken you will be contacted by phone or by letter within the next 1-3 weeks.  Please call us at [phone] if you have not heard about the biopsies in 3 weeks.    SIGNATURES/CONFIDENTIALITY: You and/or your care partner have signed paperwork which will be entered into your electronic medical record.  These signatures attest to the fact that that the information above on your After Visit Summary has been reviewed and is understood.  Full responsibility of the confidentiality of this discharge information lies with you and/or your care-partner. 

## 2018-08-06 NOTE — Progress Notes (Signed)
Pt's states no medical or surgical changes since previsit or office visit.  Rica Mote CMA and Riki Sheer LPN.

## 2018-08-06 NOTE — Progress Notes (Signed)
Report to PACU, RN, vss, BBS= Clear.  

## 2018-08-10 ENCOUNTER — Telehealth: Payer: Self-pay | Admitting: *Deleted

## 2018-08-10 NOTE — Telephone Encounter (Signed)
  Follow up Call-  Call back number 08/06/2018  Post procedure Call Back phone  # [phone]  Permission to leave phone message Yes  Some recent data might be hidden     Patient questions:  Do you have a fever, pain , or abdominal swelling? No. Pain Score  0 *  Have you tolerated food without any problems? Yes.    Have you been able to return to your normal activities? Yes.    Do you have any questions about your discharge instructions: Diet   No. Medications  No. Follow up visit  No.  Do you have questions or concerns about your Care? No.  Actions: * If pain score is 4 or above: No action needed, pain <4. Covid-19 screening questions   Do you now or have you had a fever in the last 14 days? no  Do you have any respiratory symptoms of shortness of breath or cough now or in the last 14 days?  no   Do you have any family members or close contacts with diagnosed or suspected Covid-19 in the past 14 days?  no  Have you been tested for Covid-19 and found to be positive?  no

## 2018-09-07 ENCOUNTER — Other Ambulatory Visit: Payer: Self-pay | Admitting: Internal Medicine

## 2018-09-07 DIAGNOSIS — Z1231 Encounter for screening mammogram for malignant neoplasm of breast: Secondary | ICD-10-CM

## 2018-09-11 ENCOUNTER — Other Ambulatory Visit: Payer: Self-pay | Admitting: Surgery

## 2018-09-11 DIAGNOSIS — E041 Nontoxic single thyroid nodule: Secondary | ICD-10-CM

## 2018-09-16 ENCOUNTER — Ambulatory Visit
Admission: RE | Admit: 2018-09-16 | Discharge: 2018-09-16 | Disposition: A | Discharge status: Home / Self Care | Payer: Medicare Other | Source: Ambulatory Visit | Attending: Surgery | Admitting: Surgery

## 2018-09-16 DIAGNOSIS — E041 Nontoxic single thyroid nodule: Secondary | ICD-10-CM

## 2018-10-17 ENCOUNTER — Other Ambulatory Visit: Payer: Self-pay

## 2018-10-17 ENCOUNTER — Encounter (HOSPITAL_COMMUNITY): Payer: Self-pay

## 2018-10-17 ENCOUNTER — Ambulatory Visit (HOSPITAL_COMMUNITY)
Admission: EM | Admit: 2018-10-17 | Discharge: 2018-10-17 | Disposition: A | Discharge status: Home / Self Care | Payer: Medicare Other | Attending: Family Medicine | Admitting: Family Medicine

## 2018-10-17 DIAGNOSIS — M25551 Pain in right hip: Secondary | ICD-10-CM | POA: Diagnosis not present

## 2018-10-17 MED ORDER — NAPROXEN 500 MG PO TABS: 500.0000 mg | ORAL_TABLET | Freq: Two times a day (BID) | ORAL | 0 refills | Status: DC | PRN

## 2018-10-17 NOTE — ED Triage Notes (Signed)
Pt states she has right hip pain time started 2 days ago.

## 2018-10-17 NOTE — ED Provider Notes (Signed)
[facility]    CSN: [id number] Arrival date & time: 10/17/18  1004      History   Chief Complaint Chief Complaint  Patient presents with  . Hip Pain    HPI Ellen [name] is a 68 y.o. female.   Presenting with right hip pain.  Is been ongoing since Friday.  She feels the pain laterally as well as in the right gluteus.  She denies any injury or trauma.  Has been doing her normal work.  The pain was severe to where she was unable to sleep last night.  She has pain on the affected side.  Took 1 ibuprofen.  Denies any history of surgery.  Pain is sharp and severe.  Is becoming more constant.  Has a history of breast cancer but is no longer receiving treatment.   Independent review of the left hip x-ray from 03/31/2012 shows no significant abnormality.  HPI  Past Medical History:  Diagnosis Date  . Breast CA ([place]) 10/2007   S/P Surgery and Radiation Tx completed 03/20/08  . Diabetes ([place])   . Glaucoma   . HTN (hypertension)   . Hyperlipidemia   . Personal history of radiation therapy 2009  . Thyroid goiter 03/2010   FNA of Left Thyroid Nodule - 05/14/2010    Patient Active Problem List   Diagnosis Date Noted  . Hemarthrosis of left knee 11/06/2017  . Left low back pain 07/14/2017  . GI bleed 05/09/2017  . Malignant neoplasm of overlapping sites of left female breast ([place]) 06/29/2015  . Dry eye syndrome 06/01/2014  . Eye disease 06/01/2014  . Goiter 04/28/2013  . History of tobacco use 11/05/2011  . Breast cancer of upper-outer quadrant of left female breast ([place]) 11/04/2011  . Obesity (BMI 30-39.9) 04/03/2011  . HTN (hypertension)   . COLONIC POLYPS, ADENOMATOUS, HX OF 05/02/2008  . Hyperlipidemia 12/11/2003    Class: Chronic    Past Surgical History:  Procedure Laterality Date  . ABDOMINAL HYSTERECTOMY  1986   Heavy bleeding  . BREAST BIOPSY  12/01/2007   Left axillary sentinal node bx : positive  . BREAST SURGERY  11/08/2007   Left partial mastectomy  (lumpectomy)  . COLONOSCOPY  2010    OB History   No obstetric history on file.      Home Medications    Prior to Admission medications   Medication Sig Start Date End Date Taking? Authorizing Provider  acetaminophen (TYLENOL) 325 MG tablet Take 2 tablets (650 mg total) by mouth every 6 (six) hours as needed. Do not take more than 4000mg  of tylenol per day 04/20/17   Couture, Cortni S, PA-C  amLODipine (NORVASC) 10 MG tablet Take 1 tablet (10 mg total) by mouth daily. 03/22/16   Horald Pollen, MD  calcium-vitamin D (OSCAL WITH D) 500-200 MG-UNIT per tablet Take 1 tablet by mouth 2 (two) times daily.    [provider]  metFORMIN (GLUCOPHAGE) 500 MG tablet Take 500 mg by mouth 2 (two) times daily. 03/24/17   [provider]  naproxen (NAPROSYN) 500 MG tablet Take 1 tablet (500 mg total) by mouth 2 (two) times daily as needed. 10/17/18 10/17/19  Rosemarie Ax, MD  pravastatin (PRAVACHOL) 40 MG tablet Take 1 tablet (40 mg total) by mouth daily. 12/13/15   Elby Beck, FNP    Family History Family History  Problem Relation Age of Onset  . Diabetes Father   . Liver cancer Father  Died at 23   . Other Brother        gunshot wound  . Colon cancer Neg Hx   . Rectal cancer Neg Hx   . Stomach cancer Neg Hx   . Esophageal cancer Neg Hx     Social History Social History   Tobacco Use  . Smoking status: Former Smoker    Types: Cigarettes    Quit date: 02/25/2007    Years since quitting: 11.6  . Smokeless tobacco: Never Used  Substance Use Topics  . Alcohol use: No  . Drug use: No     Allergies   Ace inhibitors and Simvastatin   Review of Systems Review of Systems  Constitutional: Negative for fever.  HENT: Negative for congestion.   Respiratory: Negative for cough.   Cardiovascular: Negative for chest pain.  Gastrointestinal: Negative for abdominal pain.  Musculoskeletal: Negative for gait problem.  Skin: Negative for color change.   Neurological: Negative for weakness.  Hematological: Negative for adenopathy.     Physical Exam Triage Vital Signs ED Triage Vitals  Enc Vitals Group     BP 10/17/18 1022 (!) 161/73     Pulse Rate 10/17/18 1022 75     Resp 10/17/18 1022 18     Temp 10/17/18 1022 98.3 F (36.8 C)     Temp Source 10/17/18 1022 Oral     SpO2 10/17/18 1022 97 %     Weight 10/17/18 1021 150 lb (68 kg)     Height --      Head Circumference --      Peak Flow --      Pain Score 10/17/18 1021 5     Pain Loc --      Pain Edu? --      Excl. in [place]? --    No data found.  Updated Vital Signs BP (!) 161/73 (BP Location: Right Arm)   Pulse 75   Temp 98.3 F (36.8 C) (Oral)   Resp 18   Wt 68 kg   SpO2 97%   BMI 26.57 kg/m   Visual Acuity Right Eye Distance:   Left Eye Distance:   Bilateral Distance:    Right Eye Near:   Left Eye Near:    Bilateral Near:     Physical Exam Gen: NAD, alert, cooperative with exam, well-appearing ENT: normal lips, normal nasal mucosa,  Eye: normal EOM, normal conjunctiva and lids CV:  no edema, +2 pedal pulses   Resp: no accessory muscle use, non-labored,  Skin: no rashes, no areas of induration  Neuro: normal tone, normal sensation to touch Psych:  normal insight, alert and oriented MSK:  Right hip: Mild tenderness to palpation over the greater trochanter. Some tenderness to palpation of the initial tuberosity. Normal internal and external rotation Normal strength resistance with hip flexion, knee flexion extension. Negative straight leg raise. Neurovascular intact   UC Treatments / Results  Labs (all labs ordered are listed, but only abnormal results are displayed) Labs Reviewed - No data to display  EKG   Radiology No results found.  Procedures Procedures (including critical care time)  Medications Ordered in UC Medications - No data to display  Initial Impression / Assessment and Plan / UC Course  I have reviewed the triage vital  signs and the nursing notes.  Pertinent labs & imaging results that were available during my care of the patient were reviewed by me and considered in my medical decision making (see chart for details).  Ellen [name] is a 68 year old female is presenting with right hip pain.  There is some concern for greater trochanteric pain syndrome.  Has some pain over the ischial tuberosity to suggest a hamstring strain.  Denies any new or different exercises.  Will provide with naproxen and counseled on the use.  Counseled home exercise therapy and supportive care.  Final Clinical Impressions(s) / UC Diagnoses   Final diagnoses:  Greater trochanteric pain syndrome of right lower extremity     Discharge Instructions     Please try the naproxen for 4 days straight and then as needed  Please try ice on the area  Please follow up if your symptoms fail to improve.     ED Prescriptions    Medication Sig Dispense Auth. Provider   naproxen (NAPROSYN) 500 MG tablet Take 1 tablet (500 mg total) by mouth 2 (two) times daily as needed. 30 tablet Rosemarie Ax, MD     Controlled Substance Prescriptions [place] Controlled Substance Registry consulted? Not Applicable   Rosemarie Ax, MD 10/17/18 1137

## 2018-10-17 NOTE — Discharge Instructions (Signed)
Please try the naproxen for 4 days straight and then as needed  Please try ice on the area  Please follow up if your symptoms fail to improve.

## 2018-10-18 ENCOUNTER — Ambulatory Visit
Admission: RE | Admit: 2018-10-18 | Discharge: 2018-10-18 | Disposition: A | Discharge status: Home / Self Care | Payer: Medicare Other | Source: Ambulatory Visit | Attending: Internal Medicine | Admitting: Internal Medicine

## 2018-10-18 DIAGNOSIS — E2839 Other primary ovarian failure: Secondary | ICD-10-CM

## 2018-10-27 ENCOUNTER — Ambulatory Visit: Payer: Medicare Other

## 2018-10-29 ENCOUNTER — Other Ambulatory Visit: Payer: Self-pay

## 2018-10-29 ENCOUNTER — Ambulatory Visit
Admission: RE | Admit: 2018-10-29 | Discharge: 2018-10-29 | Disposition: A | Discharge status: Home / Self Care | Payer: Medicare Other | Source: Ambulatory Visit | Attending: Internal Medicine | Admitting: Internal Medicine

## 2018-10-29 DIAGNOSIS — Z1231 Encounter for screening mammogram for malignant neoplasm of breast: Secondary | ICD-10-CM

## 2018-11-20 ENCOUNTER — Encounter (HOSPITAL_COMMUNITY): Payer: Self-pay | Admitting: *Deleted

## 2018-11-20 ENCOUNTER — Other Ambulatory Visit: Payer: Self-pay

## 2018-11-20 ENCOUNTER — Inpatient Hospital Stay (HOSPITAL_COMMUNITY)
Admission: EM | Admit: 2018-11-20 | Discharge: 2018-11-24 | DRG: 378 | Disposition: A | Discharge status: Home / Self Care | Payer: Medicare Other | Attending: Internal Medicine | Admitting: Internal Medicine

## 2018-11-20 ENCOUNTER — Emergency Department (HOSPITAL_COMMUNITY): Payer: Medicare Other

## 2018-11-20 DIAGNOSIS — E876 Hypokalemia: Secondary | ICD-10-CM | POA: Diagnosis present

## 2018-11-20 DIAGNOSIS — Z8719 Personal history of other diseases of the digestive system: Secondary | ICD-10-CM | POA: Diagnosis not present

## 2018-11-20 DIAGNOSIS — D62 Acute posthemorrhagic anemia: Secondary | ICD-10-CM

## 2018-11-20 DIAGNOSIS — K921 Melena: Secondary | ICD-10-CM | POA: Diagnosis not present

## 2018-11-20 DIAGNOSIS — E049 Nontoxic goiter, unspecified: Secondary | ICD-10-CM | POA: Diagnosis present

## 2018-11-20 DIAGNOSIS — Z86010 Personal history of colonic polyps: Secondary | ICD-10-CM | POA: Diagnosis not present

## 2018-11-20 DIAGNOSIS — Z833 Family history of diabetes mellitus: Secondary | ICD-10-CM | POA: Diagnosis not present

## 2018-11-20 DIAGNOSIS — Z888 Allergy status to other drugs, medicaments and biological substances status: Secondary | ICD-10-CM | POA: Diagnosis not present

## 2018-11-20 DIAGNOSIS — H409 Unspecified glaucoma: Secondary | ICD-10-CM | POA: Diagnosis present

## 2018-11-20 DIAGNOSIS — I10 Essential (primary) hypertension: Secondary | ICD-10-CM | POA: Diagnosis present

## 2018-11-20 DIAGNOSIS — Z9012 Acquired absence of left breast and nipple: Secondary | ICD-10-CM | POA: Diagnosis not present

## 2018-11-20 DIAGNOSIS — Z800 Family history of malignant neoplasm of digestive organs: Secondary | ICD-10-CM | POA: Diagnosis not present

## 2018-11-20 DIAGNOSIS — Z90710 Acquired absence of both cervix and uterus: Secondary | ICD-10-CM

## 2018-11-20 DIAGNOSIS — K625 Hemorrhage of anus and rectum: Secondary | ICD-10-CM | POA: Diagnosis present

## 2018-11-20 DIAGNOSIS — K644 Residual hemorrhoidal skin tags: Secondary | ICD-10-CM | POA: Diagnosis present

## 2018-11-20 DIAGNOSIS — Z853 Personal history of malignant neoplasm of breast: Secondary | ICD-10-CM | POA: Diagnosis not present

## 2018-11-20 DIAGNOSIS — Z20828 Contact with and (suspected) exposure to other viral communicable diseases: Secondary | ICD-10-CM | POA: Diagnosis present

## 2018-11-20 DIAGNOSIS — Z923 Personal history of irradiation: Secondary | ICD-10-CM

## 2018-11-20 DIAGNOSIS — Z7982 Long term (current) use of aspirin: Secondary | ICD-10-CM

## 2018-11-20 DIAGNOSIS — Z7984 Long term (current) use of oral hypoglycemic drugs: Secondary | ICD-10-CM | POA: Diagnosis not present

## 2018-11-20 DIAGNOSIS — E785 Hyperlipidemia, unspecified: Secondary | ICD-10-CM | POA: Diagnosis present

## 2018-11-20 DIAGNOSIS — K5731 Diverticulosis of large intestine without perforation or abscess with bleeding: Principal | ICD-10-CM | POA: Diagnosis present

## 2018-11-20 DIAGNOSIS — E119 Type 2 diabetes mellitus without complications: Secondary | ICD-10-CM | POA: Diagnosis present

## 2018-11-20 DIAGNOSIS — Z79899 Other long term (current) drug therapy: Secondary | ICD-10-CM | POA: Diagnosis not present

## 2018-11-20 DIAGNOSIS — K922 Gastrointestinal hemorrhage, unspecified: Secondary | ICD-10-CM

## 2018-11-20 DIAGNOSIS — Z87891 Personal history of nicotine dependence: Secondary | ICD-10-CM | POA: Diagnosis not present

## 2018-11-20 LAB — CBC WITH DIFFERENTIAL/PLATELET
Abs Immature Granulocytes: 0.01 10*3/uL (ref 0.00–0.07)
Basophils Absolute: 0 10*3/uL (ref 0.0–0.1)
Basophils Relative: 1 %
Eosinophils Absolute: 0.1 10*3/uL (ref 0.0–0.5)
Eosinophils Relative: 2 %
HCT: 37.6 % (ref 36.0–46.0)
Hemoglobin: 11.9 g/dL — ABNORMAL LOW (ref 12.0–15.0)
Immature Granulocytes: 0 %
Lymphocytes Relative: 42 %
Lymphs Abs: 2.3 10*3/uL (ref 0.7–4.0)
MCH: 27.4 pg (ref 26.0–34.0)
MCHC: 31.6 g/dL (ref 30.0–36.0)
MCV: 86.4 fL (ref 80.0–100.0)
Monocytes Absolute: 0.5 10*3/uL (ref 0.1–1.0)
Monocytes Relative: 9 %
Neutro Abs: 2.5 10*3/uL (ref 1.7–7.7)
Neutrophils Relative %: 46 %
Platelets: 350 10*3/uL (ref 150–400)
RBC: 4.35 MIL/uL (ref 3.87–5.11)
RDW: 14.6 % (ref 11.5–15.5)
WBC: 5.5 10*3/uL (ref 4.0–10.5)
nRBC: 0 % (ref 0.0–0.2)

## 2018-11-20 LAB — COMPREHENSIVE METABOLIC PANEL
ALT: 14 U/L (ref 0–44)
AST: 16 U/L (ref 15–41)
Albumin: 4.2 g/dL (ref 3.5–5.0)
Alkaline Phosphatase: 56 U/L (ref 38–126)
Anion gap: 9 (ref 5–15)
BUN: 18 mg/dL (ref 8–23)
CO2: 23 mmol/L (ref 22–32)
Calcium: 9.1 mg/dL (ref 8.9–10.3)
Chloride: 106 mmol/L (ref 98–111)
Creatinine, Ser: 0.89 mg/dL (ref 0.44–1.00)
GFR calc Af Amer: >60 mL/min (ref >60)
GFR calc non Af Amer: >60 mL/min (ref >60)
Glucose, Bld: 127 mg/dL — ABNORMAL HIGH (ref 70–99)
Potassium: 3.2 mmol/L — ABNORMAL LOW (ref 3.5–5.1)
Sodium: 138 mmol/L (ref 135–145)
Total Bilirubin: 0.2 mg/dL — ABNORMAL LOW (ref 0.3–1.2)
Total Protein: 7.1 g/dL (ref 6.5–8.1)

## 2018-11-20 LAB — POC OCCULT BLOOD, ED: Fecal Occult Bld: POSITIVE — AB

## 2018-11-20 MED ORDER — PANTOPRAZOLE SODIUM 40 MG PO TBEC
40.0000 mg | DELAYED_RELEASE_TABLET | Freq: Once | ORAL | Status: AC
  Administered 2018-11-20: 40 mg via ORAL
  Filled 2018-11-20: qty 1

## 2018-11-20 MED ORDER — SODIUM CHLORIDE (PF) 0.9 % IJ SOLN
INTRAMUSCULAR | Status: AC
  Filled 2018-11-20: qty 50

## 2018-11-20 MED ORDER — SODIUM CHLORIDE 0.9 % IV BOLUS
1000.0000 mL | Freq: Once | INTRAVENOUS | Status: AC
  Administered 2018-11-20: 1000 mL via INTRAVENOUS

## 2018-11-20 MED ORDER — IOHEXOL 350 MG/ML SOLN
100.0000 mL | Freq: Once | INTRAVENOUS | Status: AC | PRN
  Administered 2018-11-20: 100 mL via INTRAVENOUS

## 2018-11-20 NOTE — ED Triage Notes (Signed)
Pt said she went to the bathroom this morning and was straining to have a BM. She took a laxative and around 15 minutes had a bowel movement that had bright red blood in her stool. Pt denies current pain, SOB, dizziness or CP.

## 2018-11-20 NOTE — ED Provider Notes (Signed)
[facility] DEPT Provider Note   CSN: [id number] Arrival date & time: 11/20/18  1929     History   Chief Complaint Chief Complaint  Patient presents with  . Rectal Bleeding    HPI Ellen [name] is a 68 y.o. female history of breast cancer, hypertension, hyperlipidemia here presenting with blood in stool.  Patient states that she has been constipated and gave herself a Dulcolax today and then subsequently had some cramps and an episode of bright red blood per rectum.  Denies any abdominal pain or vomiting.  She does take some ibuprofen as needed but denies any history of gastric ulcers Patient follows up with Dr. Ardis Hughs outpatient .  Patient has similar episode about a year ago and was admitted to the hospital and was thought to have diverticular bleed on CT.  Patient is not currently on any blood thinners.     The history is provided by the patient.    Past Medical History:  Diagnosis Date  . Breast CA ([facility]) 10/2007   S/P Surgery and Radiation Tx completed 03/20/08  . Diabetes ([place])   . Glaucoma   . HTN (hypertension)   . Hyperlipidemia   . Personal history of radiation therapy 2009  . Thyroid goiter 03/2010   FNA of Left Thyroid Nodule - 05/14/2010    Patient Active Problem List   Diagnosis Date Noted  . Hemarthrosis of left knee 11/06/2017  . Left low back pain 07/14/2017  . GI bleed 05/09/2017  . Malignant neoplasm of overlapping sites of left female breast ([place]) 06/29/2015  . Dry eye syndrome 06/01/2014  . Eye disease 06/01/2014  . Goiter 04/28/2013  . History of tobacco use 11/05/2011  . Breast cancer of upper-outer quadrant of left female breast ([place]) 11/04/2011  . Obesity (BMI 30-39.9) 04/03/2011  . HTN (hypertension)   . COLONIC POLYPS, ADENOMATOUS, HX OF 05/02/2008  . Hyperlipidemia 12/11/2003    Class: Chronic    Past Surgical History:  Procedure Laterality Date  . ABDOMINAL HYSTERECTOMY  1986   Heavy bleeding  . BREAST  BIOPSY  12/01/2007   Left axillary sentinal node bx : positive  . BREAST SURGERY  11/08/2007   Left partial mastectomy (lumpectomy)  . COLONOSCOPY  2010     OB History   No obstetric history on file.      Home Medications    Prior to Admission medications   Medication Sig Start Date End Date Taking? Authorizing Provider  acetaminophen (TYLENOL) 325 MG tablet Take 2 tablets (650 mg total) by mouth every 6 (six) hours as needed. Do not take more than 4000mg  of tylenol per day 04/20/17   Couture, Cortni S, PA-C  amLODipine (NORVASC) 10 MG tablet Take 1 tablet (10 mg total) by mouth daily. 03/22/16   Horald Pollen, MD  calcium-vitamin D (OSCAL WITH D) 500-200 MG-UNIT per tablet Take 1 tablet by mouth 2 (two) times daily.    [provider]  metFORMIN (GLUCOPHAGE) 500 MG tablet Take 500 mg by mouth 2 (two) times daily. 03/24/17   [provider]  naproxen (NAPROSYN) 500 MG tablet Take 1 tablet (500 mg total) by mouth 2 (two) times daily as needed. 10/17/18 10/17/19  Rosemarie Ax, MD  pravastatin (PRAVACHOL) 40 MG tablet Take 1 tablet (40 mg total) by mouth daily. 12/13/15   Elby Beck, FNP    Family History Family History  Problem Relation Age of Onset  . Diabetes Father   .  Liver cancer Father        Died at 27   . Other Brother        gunshot wound  . Colon cancer Neg Hx   . Rectal cancer Neg Hx   . Stomach cancer Neg Hx   . Esophageal cancer Neg Hx     Social History Social History   Tobacco Use  . Smoking status: Former Smoker    Types: Cigarettes    Quit date: 02/25/2007    Years since quitting: 11.7  . Smokeless tobacco: Never Used  Substance Use Topics  . Alcohol use: No  . Drug use: No     Allergies   Ace inhibitors and Simvastatin   Review of Systems Review of Systems  Gastrointestinal: Positive for blood in stool and hematochezia.  All other systems reviewed and are negative.    Physical Exam Updated Vital Signs  BP (!) 171/103 (BP Location: Right Arm)   Pulse (!) 123   Temp 98.6 F (37 C) (Oral)   Resp 18   Ht 5\' 2"  (1.575 m)   Wt 69.9 kg   SpO2 98%   BMI 28.17 kg/m   Physical Exam Vitals signs and nursing note reviewed.  HENT:     Head: Normocephalic.     Nose: Nose normal.     Mouth/Throat:     Mouth: Mucous membranes are moist.  Eyes:     Extraocular Movements: Extraocular movements intact.     Pupils: Pupils are equal, round, and reactive to light.  Neck:     Musculoskeletal: Normal range of motion.  Cardiovascular:     Rate and Rhythm: Regular rhythm. Tachycardia present.     Pulses: Normal pulses.     Heart sounds: Normal heart sounds.  Pulmonary:     Effort: Pulmonary effort is normal.     Breath sounds: Normal breath sounds.  Abdominal:     General: Abdomen is flat.     Palpations: Abdomen is soft.  Genitourinary:    Comments: ? Small external hemorrhoids. Some bloody stools on glove  Musculoskeletal: Normal range of motion.  Skin:    General: Skin is warm.     Capillary Refill: Capillary refill takes less than 2 seconds.  Neurological:     General: No focal deficit present.     Mental Status: She is alert.  Psychiatric:        Mood and Affect: Mood normal.        Behavior: Behavior normal.      ED Treatments / Results  Labs (all labs ordered are listed, but only abnormal results are displayed) Labs Reviewed  CBC WITH DIFFERENTIAL/PLATELET - Abnormal; Notable for the following components:      Result Value   Hemoglobin 11.9 (*)    All other components within normal limits  POC OCCULT BLOOD, ED - Abnormal; Notable for the following components:   Fecal Occult Bld POSITIVE (*)    All other components within normal limits  COMPREHENSIVE METABOLIC PANEL  TYPE AND SCREEN    EKG EKG Interpretation  Date/Time:  Saturday November 20 2018 20:34:34 EDT Ventricular Rate:  124 PR Interval:    QRS Duration: 96 QT Interval:  320 QTC Calculation: 460 R Axis:    -84 Text Interpretation:  Sinus tachycardia Inferior infarct, old Consider anterior infarct Baseline wander in lead(s) V6 Since last tracing rate faster Confirmed by Wandra Arthurs [phone]) on 11/20/2018 9:01:57 PM   Radiology No results found.  Procedures Procedures (  including critical care time)  Medications Ordered in ED Medications  sodium chloride 0.9 % bolus 1,000 mL (has no administration in time range)  pantoprazole (PROTONIX) EC tablet 40 mg (has no administration in time range)     Initial Impression / Assessment and Plan / ED Course  I have reviewed the triage vital signs and the nursing notes.  Pertinent labs & imaging results that were available during my care of the patient were reviewed by me and considered in my medical decision making (see chart for details).       Ellen [name] is a 68 y.o. female here with blood in stool. Hx of diverticular bleed. She has small external hemorrhoids and did take stool softeners. Will get CBC and chemistry. Patient is tachycardic so will get EKG as well. Will hydrate and reassess.   10:30 PM Patient had another episode of BRBPR while in the ED. Hg 11.9. BUN/Cr normal. Patient given IVF. I talked to Dr. Loletha Carrow from GI. He states that since she has previous diverticular bleed and had another episode in the ED, recommend CTA ab/pel and if positive call IR. Hospitalist to admit. CTA ordered    Final Clinical Impressions(s) / ED Diagnoses   Final diagnoses:  None    ED Discharge Orders    None       Drenda Freeze, MD 11/20/18 2231

## 2018-11-20 NOTE — H&P (Signed)
History and Physical   Ellen [name] [id number] DOB: Jul 16, 1950 DOA: 11/20/2018  Referring MD/NP/PA: Dr. Darl Householder  PCP: Nolene Ebbs, MD   Outpatient Specialists: Dr. Loletha Carrow,   Patient coming from: Home  Chief Complaint: Rectal bleed  HPI: Ellen [name] is a 68 y.o. female with medical history significant of breast cancer, previous diverticular bleed, diabetes, hypertension, hyperlipidemia, who presented to the ER with rectal bleed that happened today twice at home and once in the ER.  Patient apparently has been constipated lately as she took some Dulcolax.  Thereafter she had cramps.  When she moved her bowels she saw blood.  She denied any pain.  She denied any nausea vomiting or hematemesis.  Patient has been on NSAIDs on and off.  She was seen and evaluated.  GI was consulted and recommended CT angiogram of the abdomen and will be followed by GI in the morning.  She had a similar episode a year ago that turned out to be secondary to diverticular bleed..  ED Course: Temperature is 97.5, blood pressure 96/68, pulse 123, respiratory rate of 28 and oxygen sat 97% room air.  Sodium 139 potassium 3.2 chloride 106 CO2 23 glucose 127 BUN 18 creatinine 0.89 gap of 9.  Hemoglobin is 11.9 otherwise CBC is within normal.  COVID-19 is negative.  Review of Systems: As per HPI otherwise 10 point review of systems negative.    Past Medical History:  Diagnosis Date  . Breast CA ([place]) 10/2007   S/P Surgery and Radiation Tx completed 03/20/08  . Diabetes ([place])   . Glaucoma   . HTN (hypertension)   . Hyperlipidemia   . Personal history of radiation therapy 2009  . Thyroid goiter 03/2010   FNA of Left Thyroid Nodule - 05/14/2010    Past Surgical History:  Procedure Laterality Date  . ABDOMINAL HYSTERECTOMY  1986   Heavy bleeding  . BREAST BIOPSY  12/01/2007   Left axillary sentinal node bx : positive  . BREAST SURGERY  11/08/2007   Left partial mastectomy (lumpectomy)  . COLONOSCOPY  2010     reports that she quit smoking about 11 years ago. Her smoking use included cigarettes. She has never used smokeless tobacco. She reports that she does not drink alcohol or use drugs.  Allergies  Allergen Reactions  . Ace Inhibitors   . Simvastatin Swelling    lips    Family History  Problem Relation Age of Onset  . Diabetes Father   . Liver cancer Father        Died at 59   . Other Brother        gunshot wound  . Colon cancer Neg Hx   . Rectal cancer Neg Hx   . Stomach cancer Neg Hx   . Esophageal cancer Neg Hx      Prior to Admission medications   Medication Sig Start Date End Date Taking? Authorizing Provider  acetaminophen (TYLENOL) 325 MG tablet Take 2 tablets (650 mg total) by mouth every 6 (six) hours as needed. Do not take more than 4000mg  of tylenol per day 04/20/17   Couture, Cortni S, PA-C  amLODipine (NORVASC) 10 MG tablet Take 1 tablet (10 mg total) by mouth daily. 03/22/16   Horald Pollen, MD  calcium-vitamin D (OSCAL WITH D) 500-200 MG-UNIT per tablet Take 1 tablet by mouth 2 (two) times daily.    [provider]  metFORMIN (GLUCOPHAGE) 500 MG tablet Take 500 mg by mouth 2 (two) times daily.  03/24/17   [provider]  naproxen (NAPROSYN) 500 MG tablet Take 1 tablet (500 mg total) by mouth 2 (two) times daily as needed. 10/17/18 10/17/19  Rosemarie Ax, MD  pravastatin (PRAVACHOL) 40 MG tablet Take 1 tablet (40 mg total) by mouth daily. 12/13/15   Elby Beck, FNP    Physical Exam: Vitals:   11/20/18 1943 11/20/18 2100 11/20/18 2130 11/20/18 2200  BP:  111/73 110/71 120/78  Pulse: (!) 123 (!) 105 94 75  Resp:  (!) 28 (!) 24 19  Temp:      TempSrc:      SpO2: 98% 98% 97% 100%  Weight:      Height:          Constitutional: NAD, calm, comfortable Vitals:   11/20/18 1943 11/20/18 2100 11/20/18 2130 11/20/18 2200  BP:  111/73 110/71 120/78  Pulse: (!) 123 (!) 105 94 75  Resp:  (!) 28 (!) 24 19  Temp:      TempSrc:       SpO2: 98% 98% 97% 100%  Weight:      Height:       Eyes: PERRL, lids and conjunctivae normal ENMT: Mucous membranes are moist. Posterior pharynx clear of any exudate or lesions.Normal dentition.  Neck: normal, supple, no masses, no thyromegaly Respiratory: clear to auscultation bilaterally, no wheezing, no crackles. Normal respiratory effort. No accessory muscle use.  Cardiovascular: Regular rate and rhythm, no murmurs / rubs / gallops. No extremity edema. 2+ pedal pulses. No carotid bruits.  Abdomen: no tenderness, no masses palpated. No hepatosplenomegaly. Bowel sounds positive.  Musculoskeletal: no clubbing / cyanosis. No joint deformity upper and lower extremities. Good ROM, no contractures. Normal muscle tone.  Skin: no rashes, lesions, ulcers. No induration Neurologic: CN 2-12 grossly intact. Sensation intact, DTR normal. Strength 5/5 in all 4.  Psychiatric: Normal judgment and insight. Alert and oriented x 3. Normal mood.     Labs on Admission: I have personally reviewed following labs and imaging studies  CBC: Recent Labs  Lab 11/20/18 2035  WBC 5.5  NEUTROABS 2.5  HGB 11.9*  HCT 37.6  MCV 86.4  PLT [id number]   Basic Metabolic Panel: Recent Labs  Lab 11/20/18 2035  NA 138  K 3.2*  CL 106  CO2 23  GLUCOSE 127*  BUN 18  CREATININE 0.89  CALCIUM 9.1   GFR: Estimated Creatinine Clearance: 56.2 mL/min (by C-G formula based on SCr of 0.89 mg/dL). Liver Function Tests: Recent Labs  Lab 11/20/18 2035  AST 16  ALT 14  ALKPHOS 56  BILITOT 0.2*  PROT 7.1  ALBUMIN 4.2   No results for input(s): LIPASE, AMYLASE in the last 168 hours. No results for input(s): AMMONIA in the last 168 hours. Coagulation Profile: No results for input(s): INR, PROTIME in the last 168 hours. Cardiac Enzymes: No results for input(s): CKTOTAL, CKMB, CKMBINDEX, TROPONINI in the last 168 hours. BNP (last 3 results) No results for input(s): PROBNP in the last 8760 hours. HbA1C: No results  for input(s): HGBA1C in the last 72 hours. CBG: No results for input(s): GLUCAP in the last 168 hours. Lipid Profile: No results for input(s): CHOL, HDL, LDLCALC, TRIG, CHOLHDL, LDLDIRECT in the last 72 hours. Thyroid Function Tests: No results for input(s): TSH, T4TOTAL, FREET4, T3FREE, THYROIDAB in the last 72 hours. Anemia Panel: No results for input(s): VITAMINB12, FOLATE, FERRITIN, TIBC, IRON, RETICCTPCT in the last 72 hours. Urine analysis:    Component Value Date/Time  COLORURINE STRAW (A) 04/22/2017 1938   APPEARANCEUR CLEAR 04/22/2017 1938   LABSPEC 1.010 05/01/2017 1705   PHURINE 7.5 05/01/2017 1705   GLUCOSEU NEGATIVE 05/01/2017 1705   HGBUR TRACE (A) 05/01/2017 1705   BILIRUBINUR NEGATIVE 05/01/2017 1705   BILIRUBINUR Negative 05/30/2014 1508   KETONESUR NEGATIVE 05/01/2017 1705   PROTEINUR NEGATIVE 05/01/2017 1705   UROBILINOGEN 0.2 05/01/2017 1705   NITRITE NEGATIVE 05/01/2017 1705   LEUKOCYTESUR NEGATIVE 05/01/2017 1705   Sepsis Labs: @LABRCNTIP (procalcitonin:4,lacticidven:4) )No results found for this or any previous visit (from the past 240 hour(s)).   Radiological Exams on Admission: No results found.  EKG: Independently reviewed.  Sinus tach at a rate of 124 no significant ST change  Assessment/Plan Principal Problem:   Rectal bleed Active Problems:   HTN (hypertension)   History of tobacco use   Hypokalemia     #1 rectal bleed: We will follow GI recommendations.  Start serial CBC with IV Protonix and monitor on telemetry.  CT angiogram of the abdomen will be performed per GI recommendations and will follow after results.  #2 hypertension: We will hold blood pressure medicine in the setting of GI bleed.  Continue monitor  #3 hypokalemia: Replete potassium  #4 history of tobacco abuse: Patient has stopped smoking.  Continue close monitor   DVT prophylaxis: SCD Code Status: Full code Family Communication: No family at bedside Disposition  Plan: Home Consults called: Dr. Ardis Hughs gastroenterology Admission status: Inpatient  Severity of Illness: The appropriate patient status for this patient is INPATIENT. Inpatient status is judged to be reasonable and necessary in order to provide the required intensity of service to ensure the patient's safety. The patient's presenting symptoms, physical exam findings, and initial radiographic and laboratory data in the context of their chronic comorbidities is felt to place them at high risk for further clinical deterioration. Furthermore, it is not anticipated that the patient will be medically stable for discharge from the hospital within 2 midnights of admission. The following factors support the patient status of inpatient.   " The patient's presenting symptoms include rectal bleed. " The worrisome physical exam findings include no significant findings. " The initial radiographic and laboratory data are worrisome because of stool guaiac positive. " The chronic co-morbidities include hypertension and history of breast cancer.   * I certify that at the point of admission it is my clinical judgment that the patient will require inpatient hospital care spanning beyond 2 midnights from the point of admission due to high intensity of service, high risk for further deterioration and high frequency of surveillance required.Barbette Merino MD [facility] Pager [phone]  If 7PM-7AM, please contact night-coverage [email or web address] Password [facility]  11/20/2018, 10:46 PM

## 2018-11-21 DIAGNOSIS — I10 Essential (primary) hypertension: Secondary | ICD-10-CM

## 2018-11-21 DIAGNOSIS — K921 Melena: Secondary | ICD-10-CM

## 2018-11-21 DIAGNOSIS — D62 Acute posthemorrhagic anemia: Secondary | ICD-10-CM

## 2018-11-21 DIAGNOSIS — E876 Hypokalemia: Secondary | ICD-10-CM

## 2018-11-21 DIAGNOSIS — Z87891 Personal history of nicotine dependence: Secondary | ICD-10-CM

## 2018-11-21 DIAGNOSIS — K5731 Diverticulosis of large intestine without perforation or abscess with bleeding: Principal | ICD-10-CM

## 2018-11-21 LAB — COMPREHENSIVE METABOLIC PANEL
ALT: 11 U/L (ref 0–44)
AST: 13 U/L — ABNORMAL LOW (ref 15–41)
Albumin: 3.2 g/dL — ABNORMAL LOW (ref 3.5–5.0)
Alkaline Phosphatase: 41 U/L (ref 38–126)
Anion gap: 7 (ref 5–15)
BUN: 20 mg/dL (ref 8–23)
CO2: 20 mmol/L — ABNORMAL LOW (ref 22–32)
Calcium: 7.6 mg/dL — ABNORMAL LOW (ref 8.9–10.3)
Chloride: 112 mmol/L — ABNORMAL HIGH (ref 98–111)
Creatinine, Ser: 0.56 mg/dL (ref 0.44–1.00)
GFR calc Af Amer: >60 mL/min (ref >60)
GFR calc non Af Amer: >60 mL/min (ref >60)
Glucose, Bld: 113 mg/dL — ABNORMAL HIGH (ref 70–99)
Potassium: 3.6 mmol/L (ref 3.5–5.1)
Sodium: 139 mmol/L (ref 135–145)
Total Bilirubin: 0.5 mg/dL (ref 0.3–1.2)
Total Protein: 5.7 g/dL — ABNORMAL LOW (ref 6.5–8.1)

## 2018-11-21 LAB — CBC WITH DIFFERENTIAL/PLATELET
Abs Immature Granulocytes: 0.03 10*3/uL (ref 0.00–0.07)
Abs Immature Granulocytes: 0.03 10*3/uL (ref 0.00–0.07)
Abs Immature Granulocytes: 0.03 10*3/uL (ref 0.00–0.07)
Abs Immature Granulocytes: 0.02 10*3/uL (ref 0.00–0.07)
Basophils Absolute: 0 10*3/uL (ref 0.0–0.1)
Basophils Absolute: 0 10*3/uL (ref 0.0–0.1)
Basophils Absolute: 0 10*3/uL (ref 0.0–0.1)
Basophils Absolute: 0 10*3/uL (ref 0.0–0.1)
Basophils Relative: 0 %
Basophils Relative: 0 %
Basophils Relative: 0 %
Basophils Relative: 0 %
Eosinophils Absolute: 0 10*3/uL (ref 0.0–0.5)
Eosinophils Absolute: 0.1 10*3/uL (ref 0.0–0.5)
Eosinophils Absolute: 0.1 10*3/uL (ref 0.0–0.5)
Eosinophils Absolute: 0.2 10*3/uL (ref 0.0–0.5)
Eosinophils Relative: 0 %
Eosinophils Relative: 1 %
Eosinophils Relative: 2 %
Eosinophils Relative: 3 %
HCT: 26.9 % — ABNORMAL LOW (ref 36.0–46.0)
HCT: 24.2 % — ABNORMAL LOW (ref 36.0–46.0)
HCT: 24.1 % — ABNORMAL LOW (ref 36.0–46.0)
HCT: 27.5 % — ABNORMAL LOW (ref 36.0–46.0)
Hemoglobin: 8.4 g/dL — ABNORMAL LOW (ref 12.0–15.0)
Hemoglobin: 7.6 g/dL — ABNORMAL LOW (ref 12.0–15.0)
Hemoglobin: 7.7 g/dL — ABNORMAL LOW (ref 12.0–15.0)
Hemoglobin: 8.7 g/dL — ABNORMAL LOW (ref 12.0–15.0)
Immature Granulocytes: 0 %
Immature Granulocytes: 1 %
Immature Granulocytes: 1 %
Immature Granulocytes: 0 %
Lymphocytes Relative: 16 %
Lymphocytes Relative: 25 %
Lymphocytes Relative: 29 %
Lymphocytes Relative: 41 %
Lymphs Abs: 1.1 10*3/uL (ref 0.7–4.0)
Lymphs Abs: 1.7 10*3/uL (ref 0.7–4.0)
Lymphs Abs: 1.8 10*3/uL (ref 0.7–4.0)
Lymphs Abs: 2 10*3/uL (ref 0.7–4.0)
MCH: 27.5 pg (ref 26.0–34.0)
MCH: 27.1 pg (ref 26.0–34.0)
MCH: 27.9 pg (ref 26.0–34.0)
MCH: 28.2 pg (ref 26.0–34.0)
MCHC: 31.2 g/dL (ref 30.0–36.0)
MCHC: 31.4 g/dL (ref 30.0–36.0)
MCHC: 32 g/dL (ref 30.0–36.0)
MCHC: 31.6 g/dL (ref 30.0–36.0)
MCV: 88.2 fL (ref 80.0–100.0)
MCV: 86.4 fL (ref 80.0–100.0)
MCV: 87.3 fL (ref 80.0–100.0)
MCV: 89.3 fL (ref 80.0–100.0)
Monocytes Absolute: 0.4 10*3/uL (ref 0.1–1.0)
Monocytes Absolute: 0.5 10*3/uL (ref 0.1–1.0)
Monocytes Absolute: 0.5 10*3/uL (ref 0.1–1.0)
Monocytes Absolute: 0.5 10*3/uL (ref 0.1–1.0)
Monocytes Relative: 6 %
Monocytes Relative: 7 %
Monocytes Relative: 8 %
Monocytes Relative: 9 %
Neutro Abs: 5.4 10*3/uL (ref 1.7–7.7)
Neutro Abs: 4.4 10*3/uL (ref 1.7–7.7)
Neutro Abs: 3.8 10*3/uL (ref 1.7–7.7)
Neutro Abs: 2.2 10*3/uL (ref 1.7–7.7)
Neutrophils Relative %: 78 %
Neutrophils Relative %: 66 %
Neutrophils Relative %: 60 %
Neutrophils Relative %: 47 %
Platelets: 228 10*3/uL (ref 150–400)
Platelets: 240 10*3/uL (ref 150–400)
Platelets: 212 10*3/uL (ref 150–400)
Platelets: 239 10*3/uL (ref 150–400)
RBC: 3.05 MIL/uL — ABNORMAL LOW (ref 3.87–5.11)
RBC: 2.8 MIL/uL — ABNORMAL LOW (ref 3.87–5.11)
RBC: 2.76 MIL/uL — ABNORMAL LOW (ref 3.87–5.11)
RBC: 3.08 MIL/uL — ABNORMAL LOW (ref 3.87–5.11)
RDW: 14.6 % (ref 11.5–15.5)
RDW: 14.6 % (ref 11.5–15.5)
RDW: 14.9 % (ref 11.5–15.5)
RDW: 14.9 % (ref 11.5–15.5)
WBC: 6.9 10*3/uL (ref 4.0–10.5)
WBC: 6.6 10*3/uL (ref 4.0–10.5)
WBC: 6.2 10*3/uL (ref 4.0–10.5)
WBC: 4.9 10*3/uL (ref 4.0–10.5)
nRBC: 0 % (ref 0.0–0.2)
nRBC: 0 % (ref 0.0–0.2)
nRBC: 0 % (ref 0.0–0.2)
nRBC: 0 % (ref 0.0–0.2)

## 2018-11-21 LAB — GLUCOSE, CAPILLARY
Glucose-Capillary: 81 mg/dL (ref 70–99)
Glucose-Capillary: 122 mg/dL — ABNORMAL HIGH (ref 70–99)
Glucose-Capillary: 110 mg/dL — ABNORMAL HIGH (ref 70–99)
Glucose-Capillary: 128 mg/dL — ABNORMAL HIGH (ref 70–99)

## 2018-11-21 LAB — HEMOGLOBIN A1C
Hgb A1c MFr Bld: 5.9 % — ABNORMAL HIGH (ref 4.8–5.6)
Mean Plasma Glucose: 122.63 mg/dL

## 2018-11-21 LAB — SARS CORONAVIRUS 2 BY RT PCR (HOSPITAL ORDER, PERFORMED IN ~~LOC~~ HOSPITAL LAB): SARS Coronavirus 2: NEGATIVE

## 2018-11-21 LAB — TYPE AND SCREEN
ABO/RH(D): B POS
Antibody Screen: NEGATIVE

## 2018-11-21 MED ORDER — ONDANSETRON HCL 4 MG/2ML IJ SOLN: 4.0000 mg | Freq: Four times a day (QID) | INTRAMUSCULAR | Status: DC | PRN

## 2018-11-21 MED ORDER — SODIUM CHLORIDE 0.9 % IV SOLN
INTRAVENOUS | Status: DC
  Administered 2018-11-21 – 2018-11-24 (×6): via INTRAVENOUS

## 2018-11-21 MED ORDER — ONDANSETRON HCL 4 MG PO TABS: 4.0000 mg | ORAL_TABLET | Freq: Four times a day (QID) | ORAL | Status: DC | PRN

## 2018-11-21 MED ORDER — INSULIN ASPART 100 UNIT/ML ~~LOC~~ SOLN
0.0000 [IU] | Freq: Three times a day (TID) | SUBCUTANEOUS | Status: DC
  Administered 2018-11-21: 1 [IU] via SUBCUTANEOUS

## 2018-11-21 MED ORDER — PANTOPRAZOLE SODIUM 40 MG IV SOLR
40.0000 mg | Freq: Two times a day (BID) | INTRAVENOUS | Status: DC
  Administered 2018-11-21 – 2018-11-23 (×6): 40 mg via INTRAVENOUS
  Filled 2018-11-21 (×6): qty 40

## 2018-11-21 NOTE — Progress Notes (Signed)
PROGRESS NOTE    Ellen [name]  [id number] DOB: 17-Dec-1950 DOA: 11/20/2018 PCP: Nolene Ebbs, MD   Brief Narrative: Ellen [name] is a 68 y.o. female with medical history significant of breast cancer, previous diverticular bleed, diabetes, hypertension, hyperlipidemia. Patient presented secondary to rectal bleeding in setting of diverticulosis history.   Assessment & Plan:   Principal Problem:   Rectal bleed Active Problems:   HTN (hypertension)   History of tobacco use   Hypokalemia   Rectal bleed Likely diverticular bleeding. Patient with a history of recurrent diverticular bleeding. CTA of abdomen and pelvis significant for contrast extravasation of the proximal cecum. [facility] consulted. No bloody stools overnight, but significant hemoglobin drop from 11.9 to 8.4 this morning. -CBC q6 hours -GI recommendations pending -Transfuse for hemoglobin <7, or significant symptoms  Acute blood loss anemia Secondary to above.  Essential hypertension Normotensive, slightly soft. On amlodipine as an outpatient which has been held -Watch BP  Hypokalemia Given supplementation  History of tobacco abuse Stopped per chart review  Diabetes mellitus, type 2 History. Metformin as an outpatient. Hemoglobin A1C of 5.9%. -SSI -Hold metformin for 48 hours post contrast imaging   DVT prophylaxis: SCDs Code Status:   Code Status: Full Code Family Communication: None Disposition Plan: Discharge pending stabilization of rectal bleeding and GI recommendation   Consultants:   [facility]  Procedures:   None  Antimicrobials:  None    Subjective: No bleeding overnight. No other issues this morning.  Objective: Vitals:   11/21/18 0109 11/21/18 0152 11/21/18 0650 11/21/18 1243  BP: 116/65 124/70 101/65 98/67  Pulse: 60 79 71 68  Resp: (!) 23 14 14 20   Temp: (!) 97.5 F (36.4 C) 98 F (36.7 C) 98.1 F (36.7 C) 98.9 F (37.2 C)  TempSrc: Oral Oral Oral Oral   SpO2: 100% 100% 99% 98%  Weight:      Height:        Intake/Output Summary (Last 24 hours) at 11/21/2018 1346 Last data filed at 11/21/2018 1158 Gross per 24 hour  Intake 437.76 ml  Output 1150 ml  Net -712.24 ml   Filed Weights   11/20/18 1942  Weight: 69.9 kg    Examination:  General exam: Appears calm and comfortable  Respiratory system: Clear to auscultation. Respiratory effort normal. Cardiovascular system: S1 & S2 heard, RRR. No murmurs, rubs, gallops or clicks. Gastrointestinal system: Abdomen is nondistended, soft and nontender. No organomegaly or masses felt. Normal bowel sounds heard. Central nervous system: Alert and oriented. No focal neurological deficits. Extremities: No edema. No calf tenderness Skin: No cyanosis. No rashes Psychiatry: Judgement and insight appear normal. Mood & affect appropriate.     Data Reviewed: I have personally reviewed following labs and imaging studies  CBC: Recent Labs  Lab 11/20/18 2035 11/21/18 0423 11/21/18 1020  WBC 5.5 6.9 6.6  NEUTROABS 2.5 5.4 4.4  HGB 11.9* 8.4* 7.6*  HCT 37.6 26.9* 24.2*  MCV 86.4 88.2 86.4  PLT 350 228 [id number]   Basic Metabolic Panel: Recent Labs  Lab 11/20/18 2035 11/21/18 0423  NA 138 139  K 3.2* 3.6  CL 106 112*  CO2 23 20*  GLUCOSE 127* 113*  BUN 18 20  CREATININE 0.89 0.56  CALCIUM 9.1 7.6*   GFR: Estimated Creatinine Clearance: 62.5 mL/min (by C-G formula based on SCr of 0.56 mg/dL). Liver Function Tests: Recent Labs  Lab 11/20/18 2035 11/21/18 0423  AST 16 13*  ALT 14 11  ALKPHOS 56 41  BILITOT 0.2* 0.5  PROT 7.1 5.7*  ALBUMIN 4.2 3.2*   No results for input(s): LIPASE, AMYLASE in the last 168 hours. No results for input(s): AMMONIA in the last 168 hours. Coagulation Profile: No results for input(s): INR, PROTIME in the last 168 hours. Cardiac Enzymes: No results for input(s): CKTOTAL, CKMB, CKMBINDEX, TROPONINI in the last 168 hours. BNP (last 3 results) No results  for input(s): PROBNP in the last 8760 hours. HbA1C: Recent Labs    11/21/18 0423  HGBA1C 5.9*   CBG: Recent Labs  Lab 11/21/18 0758 11/21/18 1155  GLUCAP 81 122*   Lipid Profile: No results for input(s): CHOL, HDL, LDLCALC, TRIG, CHOLHDL, LDLDIRECT in the last 72 hours. Thyroid Function Tests: No results for input(s): TSH, T4TOTAL, FREET4, T3FREE, THYROIDAB in the last 72 hours. Anemia Panel: No results for input(s): VITAMINB12, FOLATE, FERRITIN, TIBC, IRON, RETICCTPCT in the last 72 hours. Sepsis Labs: No results for input(s): PROCALCITON, LATICACIDVEN in the last 168 hours.  Recent Results (from the past 240 hour(s))  SARS Coronavirus 2 [facility] order, Performed in [facility] hospital lab) Nasopharyngeal Nasopharyngeal Swab     Status: None   Collection Time: 11/21/18  1:28 AM   Specimen: Nasopharyngeal Swab  Result Value Ref Range Status   SARS Coronavirus 2 NEGATIVE NEGATIVE Final    Comment: (NOTE) If result is NEGATIVE SARS-CoV-2 target nucleic acids are NOT DETECTED. The SARS-CoV-2 RNA is generally detectable in upper and lower  respiratory specimens during the acute phase of infection. The lowest  concentration of SARS-CoV-2 viral copies this assay can detect is 250  copies / mL. A negative result does not preclude SARS-CoV-2 infection  and should not be used as the sole basis for treatment or other  patient management decisions.  A negative result may occur with  improper specimen collection / handling, submission of specimen other  than nasopharyngeal swab, presence of viral mutation(s) within the  areas targeted by this assay, and inadequate number of viral copies  (<250 copies / mL). A negative result must be combined with clinical  observations, patient history, and epidemiological information. If result is POSITIVE SARS-CoV-2 target nucleic acids are DETECTED. The SARS-CoV-2 RNA is generally detectable in upper and lower  respiratory specimens dur ing  the acute phase of infection.  Positive  results are indicative of active infection with SARS-CoV-2.  Clinical  correlation with patient history and other diagnostic information is  necessary to determine patient infection status.  Positive results do  not rule out bacterial infection or co-infection with other viruses. If result is PRESUMPTIVE POSTIVE SARS-CoV-2 nucleic acids MAY BE PRESENT.   A presumptive positive result was obtained on the submitted specimen  and confirmed on repeat testing.  While 2019 novel coronavirus  (SARS-CoV-2) nucleic acids may be present in the submitted sample  additional confirmatory testing may be necessary for epidemiological  and / or clinical management purposes  to differentiate between  SARS-CoV-2 and other Sarbecovirus currently known to infect humans.  If clinically indicated additional testing with an alternate test  methodology [id number]) is advised. The SARS-CoV-2 RNA is generally  detectable in upper and lower respiratory sp ecimens during the acute  phase of infection. The expected result is Negative. Fact Sheet for Patients:  StrictlyIdeas.no Fact Sheet for Healthcare Providers: BankingDealers.co.za This test is not yet approved or cleared by the [place] FDA and has been authorized for detection and/or diagnosis of SARS-CoV-2 by FDA under an Emergency Use Authorization (EUA).  This  EUA will remain in effect (meaning this test can be used) for the duration of the COVID-19 declaration under Section 564(b)(1) of the Act, 21 U.S.C. section 360bbb-3(b)(1), unless the authorization is terminated or revoked sooner. Performed at [facility], [place] [address] [place], [place] [address]          Radiology Studies: Ct Angio Abd/pel W And/or Wo Contrast  Result Date: 11/20/2018 CLINICAL DATA:  Lower GI bleed EXAM: CTA ABDOMEN AND PELVIS wITHOUT AND WITH CONTRAST TECHNIQUE:  Multidetector CT imaging of the abdomen and pelvis was performed using the standard protocol during bolus administration of intravenous contrast. Multiplanar reconstructed images and MIPs were obtained and reviewed to evaluate the vascular anatomy. CONTRAST:  139mL OMNIPAQUE IOHEXOL 350 MG/ML SOLN COMPARISON:  CT 05/09/2017 FINDINGS: VASCULAR Aorta: Scattered atheromatous plaque of the otherwise normal caliber aorta. No evidence of aneurysm, dissection or vasculitis. Celiac: Patent without evidence of aneurysm, dissection, vasculitis or significant stenosis. SMA: Patent without evidence of aneurysm, dissection, vasculitis or significant stenosis. Renals: Single renal arteries bilaterally. Both renal arteries are patent without evidence of aneurysm, dissection, vasculitis or features of fibromuscular dysplasia. IMA: Calcified plaque results in moderate to severe narrowing at the ostium of the inferior mesenteric artery distal vessel normally opacifies possibly supplied by colic collaterals. Inflow: Proximal inflow vessels demonstrate atheromatous plaque. No significant stenosis. No evidence of aneurysm, dissection or vasculitis. Proximal Outflow: Bilateral common femoral and visualized portions of the superficial and profunda femoral arteries are patent without evidence of aneurysm, dissection, vasculitis or significant stenosis. Veins: Portal and hepatic veins normally opacify on delayed phase imaging. Review of the MIP images confirms the above findings. NON-VASCULAR Lower chest: Bandlike area of scarring in the right middle lobe. Lung bases are otherwise unremarkable. Hepatobiliary: Multiple fluid attenuation cysts are present throughout the left and right lobes of the liver. Additional subcentimeter hypoattenuating foci are too small to fully characterize though statistically likely benign and possibly reflective additional cysts. The gallbladder is normal. No biliary ductal dilatation. Pancreas: Unremarkable. No  pancreatic ductal dilatation or surrounding inflammatory changes. Spleen: Normal in size without focal abnormality. Adrenals/Urinary Tract: Adrenal glands are unremarkable. Kidneys are normal, without renal calculi, focal lesion, or hydronephrosis. Bladder is unremarkable. Stomach/Bowel: Distal esophagus, stomach and duodenal sweep are unremarkable. No bowel wall thickening or dilatation. No evidence of obstruction. A normal appendix is visualized. There are right and left-sided colonic diverticula. There is a blush of high-density contrast material within the cecum seen on arterial phase with expansion by the portal venous phase concerning for active contrast extravasation, possibly diverticular bleeding. Colon is fluid-filled. No colonic dilatation or wall thickening. Lymphatic: No suspicious or enlarged lymph nodes in the included lymphatic chains. Reproductive: Uterus is surgically absent. No concerning adnexal lesions. Other: No abdominopelvic free fluid or free gas. No bowel containing hernias. Musculoskeletal: No acute osseous abnormality or suspicious osseous lesion. Circumscribed fat attenuation lesion arising subjacent to the right latissimus dorsi. Incompletely included in the level of imaging measuring up to 9.2 cm in size. IMPRESSION: VASCULAR Focus of active contrast extravasation the proximal cecum. Patient does demonstrate right-sided colonic diverticula with minimal adjacent inflammation, suggesting possible diverticular etiology for this lower GI bleed. 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 NON-VASCULAR Circumscribed fat attenuation lesion subjacent to the right latissimus dorsi. 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 If pain or symptoms develop, consider reimaging. Electronically Signed   By:  Lovena Le M.D.   On: 11/20/2018 23:54        Scheduled Meds: .  insulin aspart  0-9 Units Subcutaneous TID WC  . pantoprazole (PROTONIX) IV  40 mg Intravenous Q12H   Continuous Infusions: . sodium chloride 75 mL/hr at 11/21/18 0157     LOS: 1 day     Cordelia Poche, MD [facility] 11/21/2018, 1:46 PM  If 7PM-7AM, please contact night-coverage [email or web address]

## 2018-11-21 NOTE — Consult Note (Addendum)
Consultation  Referring Provider: Dr. Lonny Prude      Primary Care Physician:  Nolene Ebbs, MD Primary Gastroenterologist: Dr. Ardis Hughs         Reason for Consultation: Rectal Bleed            HPI:   Ellen [name] is a 68 y.o. female the past medical history significant for breast cancer, previous diverticular bleed, diabetes, hypertension, and others listed below, who presented to the ER on 11/20/2018 with rectal bleeding.    Today, patient explains that she had been somewhat constipated lately which she describes as feeling "tight".  She took 3 Dulcolax around 6:00 on the morning of 11/20/2018 and later that evening around 730 she started with abdominal "griping" she then had a bowel movement which was "a lot of bright red blood with only small amount of stool". Had another bowel movement which seemed to be just dark red/maroon-colored blood around 9 or 10:00 at night.  She proceeded to the ER.  Patient tells me she has not had a bowel movement since being here.  Does describe a couple episodes of feeling "hot and faint" last night while laying in bed watching TV.  This happened 2-3 times and lasted for 15 to 20 minutes.  Does admit to using Aleve 2 tablets/day for bursitis diagnosed about 3 weeks ago.    Denies fever, chills, abdominal pain, nausea, vomiting or weight loss.  ED course: Blood pressure 96/68, pulse 123 sodium 139, potassium 3.2, BUN 18, creatinine 0.89, hemoglobin 11.9, COVID negative  GI history: 08/06/2018 colonoscopy Dr. Ardis Hughs: Diverticulosis in the left colon otherwise normal, repeat recommended in 5 yrs 05/09/17 admission for GI bleed: suspected diverticular, stabilized without intervention after one day  Past Medical History:  Diagnosis Date  . Breast CA ([place]) 10/2007   S/P Surgery and Radiation Tx completed 03/20/08  . Diabetes ([place])   . Glaucoma   . HTN (hypertension)   . Hyperlipidemia   . Personal history of radiation therapy 2009  . Thyroid goiter 03/2010   FNA of Left Thyroid Nodule - 05/14/2010    Past Surgical History:  Procedure Laterality Date  . ABDOMINAL HYSTERECTOMY  1986   Heavy bleeding  . BREAST BIOPSY  12/01/2007   Left axillary sentinal node bx : positive  . BREAST SURGERY  11/08/2007   Left partial mastectomy (lumpectomy)  . COLONOSCOPY  2010    Family History  Problem Relation Age of Onset  . Diabetes Father   . Liver cancer Father        Died at 77   . Other Brother        gunshot wound  . Colon cancer Neg Hx   . Rectal cancer Neg Hx   . Stomach cancer Neg Hx   . Esophageal cancer Neg Hx     Social History   Tobacco Use  . Smoking status: Former Smoker    Types: Cigarettes    Quit date: 02/25/2007    Years since quitting: 11.7  . Smokeless tobacco: Never Used  Substance Use Topics  . Alcohol use: No  . Drug use: No    Prior to Admission medications   Medication Sig Start Date End Date Taking? Authorizing Provider  amLODipine (NORVASC) 10 MG tablet Take 1 tablet (10 mg total) by mouth daily. 03/22/16  Yes Sagardia, Ines Bloomer, MD  calcium-vitamin D (OSCAL WITH D) 500-200 MG-UNIT per tablet Take 1 tablet by mouth 2 (two) times daily.   Yes  [provider]  cholecalciferol (VITAMIN D3) 25 MCG (1000 UT) tablet Take 2,000 Units by mouth daily.   Yes [provider]  hydroxypropyl methylcellulose / hypromellose (ISOPTO TEARS / GONIOVISC) 2.5 % ophthalmic solution Place 1 drop into both eyes 3 (three) times daily as needed for dry eyes.   Yes [provider]  metFORMIN (GLUCOPHAGE) 500 MG tablet Take 500 mg by mouth 2 (two) times daily. 03/24/17  Yes [provider]  naproxen (NAPROSYN) 500 MG tablet Take 1 tablet (500 mg total) by mouth 2 (two) times daily as needed. Patient taking differently: Take 500 mg by mouth 2 (two) times daily as needed for moderate pain.  10/17/18 10/17/19 Yes Rosemarie Ax, MD  pravastatin (PRAVACHOL) 40 MG tablet Take 1 tablet (40 mg total) by mouth  daily. 12/13/15  Yes Elby Beck, FNP  acetaminophen (TYLENOL) 325 MG tablet Take 2 tablets (650 mg total) by mouth every 6 (six) hours as needed. Do not take more than 4000mg  of tylenol per day Patient not taking: Reported on 11/20/2018 04/20/17   Couture, Cortni S, PA-C    Current Facility-Administered Medications  Medication Dose Route Frequency Provider Last Rate Last Dose  . 0.9 %  sodium chloride infusion   Intravenous Continuous Elwyn Reach, MD 75 mL/hr at 11/21/18 0157    . insulin aspart (novoLOG) injection 0-9 Units  0-9 Units Subcutaneous TID WC Garba, Mohammad L, MD      . ondansetron (ZOFRAN) tablet 4 mg  4 mg Oral Q6H PRN Elwyn Reach, MD       Or  . ondansetron (ZOFRAN) injection 4 mg  4 mg Intravenous Q6H PRN Gala Romney L, MD      . pantoprazole (PROTONIX) injection 40 mg  40 mg Intravenous Q12H Elwyn Reach, MD   40 mg at 11/21/18 0211  . sodium chloride (PF) 0.9 % injection             Allergies as of 11/20/2018 - Review Complete 11/20/2018  Allergen Reaction Noted  . Ace inhibitors Swelling 04/18/2010  . Simvastatin Swelling 04/03/2011     Review of Systems:    Constitutional: No weight loss, fever or chills Skin: No rash Cardiovascular: No chest pain Respiratory: No SOB Gastrointestinal: See HPI and otherwise negative Genitourinary: No dysuria Neurological: No headache, dizziness or syncope Musculoskeletal: No new muscle or joint pain Hematologic: No bruising Psychiatric: No history of depression or anxiety    Physical Exam:  Vital signs in last 24 hours: Temp:  [97.5 F (36.4 C)-98.6 F (37 C)] 98.1 F (36.7 C) (09/27 0650) Pulse Rate:  [60-123] 71 (09/27 0650) Resp:  [14-28] 14 (09/27 0650) BP: (96-171)/(65-103) 101/65 (09/27 0650) SpO2:  [97 %-100 %] 99 % (09/27 0650) Weight:  [69.9 kg] 69.9 kg (09/26 1942) Last BM Date: 11/21/18(lower GI bleed) General:   Pleasant AA female appears to be in NAD, Well developed, Well  nourished, alert and cooperative Head:  Normocephalic and atraumatic. Eyes:   PEERL, EOMI. No icterus. Conjunctiva pink. Ears:  Normal auditory acuity. Neck:  Supple Throat: Oral cavity and pharynx without inflammation, swelling or lesion.  Lungs: Respirations even and unlabored. Lungs clear to auscultation bilaterally.   No wheezes, crackles, or rhonchi.  Heart: Normal S1, S2. No MRG. Regular rate and rhythm. No peripheral edema, cyanosis or pallor.  Abdomen:  Soft, nondistended, nontender. No rebound or guarding. Normal bowel sounds. No appreciable masses or hepatomegaly. Rectal:  Not performed.  Msk:  Symmetrical without gross deformities. Peripheral pulses intact.  Extremities:  Without edema, no deformity or joint abnormality. Normal ROM, normal sensation. Neurologic:  Alert and  oriented x4;  grossly normal neurologically.  Skin:   Dry and intact without significant lesions or rashes. Psychiatric: Demonstrates good judgement and reason without abnormal affect or behaviors.   LAB RESULTS: Recent Labs    11/20/18 2035 11/21/18 0423  WBC 5.5 6.9  HGB 11.9* 8.4*  HCT 37.6 26.9*  PLT 350 228   BMET Recent Labs    11/20/18 2035 11/21/18 0423  NA 138 139  K 3.2* 3.6  CL 106 112*  CO2 23 20*  GLUCOSE 127* 113*  BUN 18 20  CREATININE 0.89 0.56  CALCIUM 9.1 7.6*   LFT Recent Labs    11/21/18 0423  PROT 5.7*  ALBUMIN 3.2*  AST 13*  ALT 11  ALKPHOS 41  BILITOT 0.5   STUDIES: Ct Angio Abd/pel W And/or Wo Contrast  Result Date: 11/20/2018 CLINICAL DATA:  Lower GI bleed EXAM: CTA ABDOMEN AND PELVIS wITHOUT AND WITH CONTRAST TECHNIQUE: Multidetector CT imaging of the abdomen and pelvis was performed using the standard protocol during bolus administration of intravenous contrast. Multiplanar reconstructed images and MIPs were obtained and reviewed to evaluate the vascular anatomy. CONTRAST:  162mL OMNIPAQUE IOHEXOL 350 MG/ML SOLN COMPARISON:  CT 05/09/2017 FINDINGS:  VASCULAR Aorta: Scattered atheromatous plaque of the otherwise normal caliber aorta. No evidence of aneurysm, dissection or vasculitis. Celiac: Patent without evidence of aneurysm, dissection, vasculitis or significant stenosis. SMA: Patent without evidence of aneurysm, dissection, vasculitis or significant stenosis. Renals: Single renal arteries bilaterally. Both renal arteries are patent without evidence of aneurysm, dissection, vasculitis or features of fibromuscular dysplasia. IMA: Calcified plaque results in moderate to severe narrowing at the ostium of the inferior mesenteric artery distal vessel normally opacifies possibly supplied by colic collaterals. Inflow: Proximal inflow vessels demonstrate atheromatous plaque. No significant stenosis. No evidence of aneurysm, dissection or vasculitis. Proximal Outflow: Bilateral common femoral and visualized portions of the superficial and profunda femoral arteries are patent without evidence of aneurysm, dissection, vasculitis or significant stenosis. Veins: Portal and hepatic veins normally opacify on delayed phase imaging. Review of the MIP images confirms the above findings. NON-VASCULAR Lower chest: Bandlike area of scarring in the right middle lobe. Lung bases are otherwise unremarkable. Hepatobiliary: Multiple fluid attenuation cysts are present throughout the left and right lobes of the liver. Additional subcentimeter hypoattenuating foci are too small to fully characterize though statistically likely benign and possibly reflective additional cysts. The gallbladder is normal. No biliary ductal dilatation. Pancreas: Unremarkable. No pancreatic ductal dilatation or surrounding inflammatory changes. Spleen: Normal in size without focal abnormality. Adrenals/Urinary Tract: Adrenal glands are unremarkable. Kidneys are normal, without renal calculi, focal lesion, or hydronephrosis. Bladder is unremarkable. Stomach/Bowel: Distal esophagus, stomach and duodenal sweep  are unremarkable. No bowel wall thickening or dilatation. No evidence of obstruction. A normal appendix is visualized. There are right and left-sided colonic diverticula. There is a blush of high-density contrast material within the cecum seen on arterial phase with expansion by the portal venous phase concerning for active contrast extravasation, possibly diverticular bleeding. Colon is fluid-filled. No colonic dilatation or wall thickening. Lymphatic: No suspicious or enlarged lymph nodes in the included lymphatic chains. Reproductive: Uterus is surgically absent. No concerning adnexal lesions. Other: No abdominopelvic free fluid or free gas. No bowel containing hernias. Musculoskeletal: No acute osseous abnormality or suspicious osseous lesion. Circumscribed fat attenuation lesion arising subjacent to  the right latissimus dorsi. Incompletely included in the level of imaging measuring up to 9.2 cm in size. IMPRESSION: VASCULAR Focus of active contrast extravasation the proximal cecum. Patient does demonstrate right-sided colonic diverticula with minimal adjacent inflammation, suggesting possible diverticular etiology for this lower GI bleed. 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 NON-VASCULAR Circumscribed fat attenuation lesion subjacent to the right latissimus dorsi. 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 If pain or symptoms develop, consider reimaging. Electronically Signed   By: Lovena Le M.D.   On: 11/20/2018 23:54    Impression / Plan:   Impression: 1. Rectal Bleed: Hemoglobin 11.9--> 8.4 overnight, CT angiogram with focus of active contrast extravasation in the proximal cecum, this most likely represents a diverticular bleed 2.  Hypokalemia: Replenished overnight 3.  Acute blood loss anemia 4.  Colonic diverticulosis  Plan: 1. Continue to monitor hgb with transfusion  as needed <7 2. Please await further recommendations from Dr. Loletha Carrow later today   Thank you for your kind consultation, we will continue to follow.  Lavone Nian [facility]  11/21/2018, 8:27 AM  I have reviewed the entire case in detail with the above APP and discussed the plan in detail.  Therefore, I agree with the diagnoses recorded above. In addition,  I have personally interviewed and examined the patient and have personally reviewed any abdominal/pelvic CT scan images.  My additional thoughts are as follows:  This patient has presented with probable diverticular bleeding, which occurred in early 2019 as well.  She had a colonoscopy in June this year with reportedly left-sided diverticulosis.  No potentially bleeding lesions such as AVM were noted in the right colon on that exam, so I wonder if she may have had small right-sided diverticuli that may have been between how sterile folds and possibly not noted on that exam.  A terminal ileal bleeding source seems less likely.   Last evening when I heard from the emergency department physician, they asked what any additional testing or action should be performed, since the patient was still actively bleeding while in the emergency department.  I recommended the patient go right to CT angiogram and, if positive, that interventional radiology be contacted immediately for an angiogram and control of bleeding.  Disappointingly, no action was taken on the positive CTA showing active bleeding the proximal right colon.  Fortunately, her bleeding appears to have stopped, at least for now.  She has over 4 units of blood in the last 24 hours, but has not yet reached transfusion threshold.  She is feeling weak on the blood loss, but otherwise no specific complaints.  She does not have abdominal pain, chest pain dyspnea or vomiting.  She must be monitored closely, maintain a liquid diet, and have serial hemoglobins.  I will sign her out to our overnight physician  and tomorrow's consult physician.  However, if this patient develops brisk rebleeding, please contact interventional radiology immediately.   Nelida Meuse III Office:[phone]

## 2018-11-21 NOTE — Plan of Care (Signed)
  Problem: Pain Managment: Goal: General experience of comfort will improve Outcome: Progressing   

## 2018-11-21 NOTE — ED Notes (Signed)
ED TO INPATIENT HANDOFF REPORT  ED Nurse Name and Phone #: jon wled   S Name/Age/Gender Ellen [name] 68 y.o. female Room/Bed: WA24/WA24  Code Status   Code Status: Prior  Home/SNF/Other Home Patient oriented to: self, place, time and situation Is this baseline? Yes   Triage Complete: Triage complete  Chief Complaint Rectal Bleeding   Triage Note Pt said she went to the bathroom this morning and was straining to have a BM. She took a laxative and around 15 minutes had a bowel movement that had bright red blood in her stool. Pt denies current pain, SOB, dizziness or CP.    Allergies Allergies  Allergen Reactions  . Ace Inhibitors Swelling  . Simvastatin Swelling    lips    Level of Care/Admitting Diagnosis ED Disposition    ED Disposition Condition Comment   Admit  Hospital Area: [facility] [[id number]]  Level of Care: Telemetry [5]  Admit to tele based on following criteria: Other see comments  Comments: GI Bleed  Covid Evaluation: Asymptomatic Screening Protocol (No Symptoms)  Diagnosis: Rectal bleed [id number]  Admitting Physician: Elwyn Reach [[id number]]  Attending Physician: Elwyn Reach [[id number]]  Estimated length of stay: past midnight tomorrow  Certification:: I certify this patient will need inpatient services for at least 2 midnights  PT Class (Do Not Modify): Inpatient [101]  PT Acc Code (Do Not Modify): Private [1]       B Medical/Surgery History Past Medical History:  Diagnosis Date  . Breast CA ([place]) 10/2007   S/P Surgery and Radiation Tx completed 03/20/08  . Diabetes ([place])   . Glaucoma   . HTN (hypertension)   . Hyperlipidemia   . Personal history of radiation therapy 2009  . Thyroid goiter 03/2010   FNA of Left Thyroid Nodule - 05/14/2010   Past Surgical History:  Procedure Laterality Date  . ABDOMINAL HYSTERECTOMY  1986   Heavy bleeding  . BREAST BIOPSY  12/01/2007   Left axillary sentinal node bx : positive  .  BREAST SURGERY  11/08/2007   Left partial mastectomy (lumpectomy)  . COLONOSCOPY  2010     A IV Location/Drains/Wounds Patient Lines/Drains/Airways Status   Active Line/Drains/Airways    Name:   Placement date:   Placement time:   Site:   Days:   Peripheral IV 11/20/18 Right;Lateral Forearm   11/20/18    2041    Forearm   1          Intake/Output Last 24 hours No intake or output data in the 24 hours ending 11/21/18 0027  Labs/Imaging Results for orders placed or performed during the hospital encounter of 11/20/18 (from the past 48 hour(s))  POC occult blood, ED     Status: Abnormal   Collection Time: 11/20/18  8:26 PM  Result Value Ref Range   Fecal Occult Bld POSITIVE (A) NEGATIVE  CBC with Differential/Platelet     Status: Abnormal   Collection Time: 11/20/18  8:35 PM  Result Value Ref Range   WBC 5.5 4.0 - 10.5 K/uL   RBC 4.35 3.87 - 5.11 MIL/uL   Hemoglobin 11.9 (L) 12.0 - 15.0 g/dL   HCT 37.6 36.0 - 46.0 %   MCV 86.4 80.0 - 100.0 fL   MCH 27.4 26.0 - 34.0 pg   MCHC 31.6 30.0 - 36.0 g/dL   RDW 14.6 11.5 - 15.5 %   Platelets 350 150 - 400 K/uL   nRBC 0.0 0.0 - 0.2 %  Neutrophils Relative % 46 %   Neutro Abs 2.5 1.7 - 7.7 K/uL   Lymphocytes Relative 42 %   Lymphs Abs 2.3 0.7 - 4.0 K/uL   Monocytes Relative 9 %   Monocytes Absolute 0.5 0.1 - 1.0 K/uL   Eosinophils Relative 2 %   Eosinophils Absolute 0.1 0.0 - 0.5 K/uL   Basophils Relative 1 %   Basophils Absolute 0.0 0.0 - 0.1 K/uL   Immature Granulocytes 0 %   Abs Immature Granulocytes 0.01 0.00 - 0.07 K/uL    Comment: Performed at [facility], [place] [address] [place], [place] [address]  Comprehensive metabolic panel     Status: Abnormal   Collection Time: 11/20/18  8:35 PM  Result Value Ref Range   Sodium 138 135 - 145 mmol/L   Potassium 3.2 (L) 3.5 - 5.1 mmol/L   Chloride 106 98 - 111 mmol/L   CO2 23 22 - 32 mmol/L   Glucose, Bld 127 (H) 70 - 99 mg/dL   BUN 18 8 - 23 mg/dL    Creatinine, Ser 0.89 0.44 - 1.00 mg/dL   Calcium 9.1 8.9 - 10.3 mg/dL   Total Protein 7.1 6.5 - 8.1 g/dL   Albumin 4.2 3.5 - 5.0 g/dL   AST 16 15 - 41 U/L   ALT 14 0 - 44 U/L   Alkaline Phosphatase 56 38 - 126 U/L   Total Bilirubin 0.2 (L) 0.3 - 1.2 mg/dL   GFR calc non Af Amer >60 >60 mL/min   GFR calc Af Amer >60 >60 mL/min   Anion gap 9 5 - 15    Comment: Performed at [facility], [place] [address] [place], [place] [address]  Type and screen     Status: None (Preliminary result)   Collection Time: 11/20/18  8:35 PM  Result Value Ref Range   ABO/RH(D) B POS    Antibody Screen PENDING    Sample Expiration      11/23/2018,2359 Performed at [facility], [place] [address], [place], [place] [address]    Ct Angio Abd/pel W And/or Wo Contrast  Result Date: 11/20/2018 CLINICAL DATA:  Lower GI bleed EXAM: CTA ABDOMEN AND PELVIS wITHOUT AND WITH CONTRAST TECHNIQUE: Multidetector CT imaging of the abdomen and pelvis was performed using the standard protocol during bolus administration of intravenous contrast. Multiplanar reconstructed images and MIPs were obtained and reviewed to evaluate the vascular anatomy. CONTRAST:  132mL OMNIPAQUE IOHEXOL 350 MG/ML SOLN COMPARISON:  CT 05/09/2017 FINDINGS: VASCULAR Aorta: Scattered atheromatous plaque of the otherwise normal caliber aorta. No evidence of aneurysm, dissection or vasculitis. Celiac: Patent without evidence of aneurysm, dissection, vasculitis or significant stenosis. SMA: Patent without evidence of aneurysm, dissection, vasculitis or significant stenosis. Renals: Single renal arteries bilaterally. Both renal arteries are patent without evidence of aneurysm, dissection, vasculitis or features of fibromuscular dysplasia. IMA: Calcified plaque results in moderate to severe narrowing at the ostium of the inferior mesenteric artery distal vessel normally opacifies possibly supplied by colic collaterals. Inflow:  Proximal inflow vessels demonstrate atheromatous plaque. No significant stenosis. No evidence of aneurysm, dissection or vasculitis. Proximal Outflow: Bilateral common femoral and visualized portions of the superficial and profunda femoral arteries are patent without evidence of aneurysm, dissection, vasculitis or significant stenosis. Veins: Portal and hepatic veins normally opacify on delayed phase imaging. Review of the MIP images confirms the above findings. NON-VASCULAR Lower chest: Bandlike area of scarring in the right middle lobe. Lung bases are otherwise unremarkable.  Hepatobiliary: Multiple fluid attenuation cysts are present throughout the left and right lobes of the liver. Additional subcentimeter hypoattenuating foci are too small to fully characterize though statistically likely benign and possibly reflective additional cysts. The gallbladder is normal. No biliary ductal dilatation. Pancreas: Unremarkable. No pancreatic ductal dilatation or surrounding inflammatory changes. Spleen: Normal in size without focal abnormality. Adrenals/Urinary Tract: Adrenal glands are unremarkable. Kidneys are normal, without renal calculi, focal lesion, or hydronephrosis. Bladder is unremarkable. Stomach/Bowel: Distal esophagus, stomach and duodenal sweep are unremarkable. No bowel wall thickening or dilatation. No evidence of obstruction. A normal appendix is visualized. There are right and left-sided colonic diverticula. There is a blush of high-density contrast material within the cecum seen on arterial phase with expansion by the portal venous phase concerning for active contrast extravasation, possibly diverticular bleeding. Colon is fluid-filled. No colonic dilatation or wall thickening. Lymphatic: No suspicious or enlarged lymph nodes in the included lymphatic chains. Reproductive: Uterus is surgically absent. No concerning adnexal lesions. Other: No abdominopelvic free fluid or free gas. No bowel containing  hernias. Musculoskeletal: No acute osseous abnormality or suspicious osseous lesion. Circumscribed fat attenuation lesion arising subjacent to the right latissimus dorsi. Incompletely included in the level of imaging measuring up to 9.2 cm in size. IMPRESSION: VASCULAR Focus of active contrast extravasation the proximal cecum. Patient does demonstrate right-sided colonic diverticula with minimal adjacent inflammation, suggesting possible diverticular etiology for this lower GI bleed. 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 NON-VASCULAR Circumscribed fat attenuation lesion subjacent to the right latissimus dorsi. 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 If pain or symptoms develop, consider reimaging. Electronically Signed   By: Lovena Le M.D.   On: 11/20/2018 23:54    Pending Labs [employer] (From admission, onward)    Start     Ordered   Signed and Held  Hemoglobin A1c  Once,   R    Comments: To assess prior glycemic control    Signed and Held   Signed and Held  HIV Antibody  (Routine Testing)  Once,   R     Signed and Held   Signed and Held  Comprehensive metabolic panel  Tomorrow morning,   R     Signed and Held   Signed and Held  CBC WITH DIFFERENTIAL  Now then every 6 hours,   R     Signed and Held          Vitals/Pain Today's Vitals   11/20/18 2100 11/20/18 2130 11/20/18 2200 11/20/18 2230  BP: 111/73 110/71 120/78 121/79  Pulse: (!) 105 94 75 79  Resp: (!) 28 (!) 24 19 (!) 21  Temp:      TempSrc:      SpO2: 98% 97% 100% 100%  Weight:      Height:      PainSc:        Isolation Precautions No active isolations  Medications Medications  sodium chloride (PF) 0.9 % injection (has no administration in time range)  sodium chloride 0.9 % bolus 1,000 mL (0 mLs Intravenous Stopped 11/20/18 2318)  pantoprazole (PROTONIX) EC tablet 40 mg (40 mg Oral  Given 11/20/18 2120)  iohexol (OMNIPAQUE) 350 MG/ML injection 100 mL (100 mLs Intravenous Contrast Given 11/20/18 2253)    Mobility walks Low fall risk   Focused Assessments -    R Recommendations: See Admitting Provider Note  Report given to:   Additional  Notes:

## 2018-11-22 DIAGNOSIS — K922 Gastrointestinal hemorrhage, unspecified: Secondary | ICD-10-CM

## 2018-11-22 DIAGNOSIS — K625 Hemorrhage of anus and rectum: Secondary | ICD-10-CM

## 2018-11-22 LAB — CBC WITH DIFFERENTIAL/PLATELET
Abs Immature Granulocytes: 0.01 10*3/uL (ref 0.00–0.07)
Abs Immature Granulocytes: 0.01 10*3/uL (ref 0.00–0.07)
Abs Immature Granulocytes: 0.03 10*3/uL (ref 0.00–0.07)
Basophils Absolute: 0 10*3/uL (ref 0.0–0.1)
Basophils Absolute: 0 10*3/uL (ref 0.0–0.1)
Basophils Absolute: 0 10*3/uL (ref 0.0–0.1)
Basophils Relative: 1 %
Basophils Relative: 1 %
Basophils Relative: 1 %
Eosinophils Absolute: 0.2 10*3/uL (ref 0.0–0.5)
Eosinophils Absolute: 0.2 10*3/uL (ref 0.0–0.5)
Eosinophils Absolute: 0.3 10*3/uL (ref 0.0–0.5)
Eosinophils Relative: 5 %
Eosinophils Relative: 6 %
Eosinophils Relative: 6 %
HCT: 24.6 % — ABNORMAL LOW (ref 36.0–46.0)
HCT: 24.7 % — ABNORMAL LOW (ref 36.0–46.0)
HCT: 29.2 % — ABNORMAL LOW (ref 36.0–46.0)
Hemoglobin: 7.5 g/dL — ABNORMAL LOW (ref 12.0–15.0)
Hemoglobin: 7.5 g/dL — ABNORMAL LOW (ref 12.0–15.0)
Hemoglobin: 8.7 g/dL — ABNORMAL LOW (ref 12.0–15.0)
Immature Granulocytes: 0 %
Immature Granulocytes: 0 %
Immature Granulocytes: 1 %
Lymphocytes Relative: 47 %
Lymphocytes Relative: 43 %
Lymphocytes Relative: 33 %
Lymphs Abs: 2 10*3/uL (ref 0.7–4.0)
Lymphs Abs: 1.5 10*3/uL (ref 0.7–4.0)
Lymphs Abs: 1.6 10*3/uL (ref 0.7–4.0)
MCH: 27.2 pg (ref 26.0–34.0)
MCH: 27 pg (ref 26.0–34.0)
MCH: 27.2 pg (ref 26.0–34.0)
MCHC: 30.5 g/dL (ref 30.0–36.0)
MCHC: 30.4 g/dL (ref 30.0–36.0)
MCHC: 29.8 g/dL — ABNORMAL LOW (ref 30.0–36.0)
MCV: 89.1 fL (ref 80.0–100.0)
MCV: 88.8 fL (ref 80.0–100.0)
MCV: 91.3 fL (ref 80.0–100.0)
Monocytes Absolute: 0.4 10*3/uL (ref 0.1–1.0)
Monocytes Absolute: 0.4 10*3/uL (ref 0.1–1.0)
Monocytes Absolute: 0.4 10*3/uL (ref 0.1–1.0)
Monocytes Relative: 10 %
Monocytes Relative: 11 %
Monocytes Relative: 8 %
Neutro Abs: 1.6 10*3/uL — ABNORMAL LOW (ref 1.7–7.7)
Neutro Abs: 1.3 10*3/uL — ABNORMAL LOW (ref 1.7–7.7)
Neutro Abs: 2.4 10*3/uL (ref 1.7–7.7)
Neutrophils Relative %: 37 %
Neutrophils Relative %: 39 %
Neutrophils Relative %: 51 %
Platelets: 226 10*3/uL (ref 150–400)
Platelets: 236 10*3/uL (ref 150–400)
Platelets: 257 10*3/uL (ref 150–400)
RBC: 2.76 MIL/uL — ABNORMAL LOW (ref 3.87–5.11)
RBC: 2.78 MIL/uL — ABNORMAL LOW (ref 3.87–5.11)
RBC: 3.2 MIL/uL — ABNORMAL LOW (ref 3.87–5.11)
RDW: 15 % (ref 11.5–15.5)
RDW: 15 % (ref 11.5–15.5)
RDW: 15.1 % (ref 11.5–15.5)
WBC: 4.3 10*3/uL (ref 4.0–10.5)
WBC: 3.4 10*3/uL — ABNORMAL LOW (ref 4.0–10.5)
WBC: 4.8 10*3/uL (ref 4.0–10.5)
nRBC: 0 % (ref 0.0–0.2)
nRBC: 0 % (ref 0.0–0.2)
nRBC: 0 % (ref 0.0–0.2)

## 2018-11-22 LAB — GLUCOSE, CAPILLARY
Glucose-Capillary: 97 mg/dL (ref 70–99)
Glucose-Capillary: 85 mg/dL (ref 70–99)
Glucose-Capillary: 105 mg/dL — ABNORMAL HIGH (ref 70–99)
Glucose-Capillary: 95 mg/dL (ref 70–99)

## 2018-11-22 LAB — HIV ANTIBODY (ROUTINE TESTING W REFLEX): HIV Screen 4th Generation wRfx: NONREACTIVE

## 2018-11-22 MED ORDER — POLYVINYL ALCOHOL 1.4 % OP SOLN
1.0000 [drp] | OPHTHALMIC | Status: DC | PRN
  Administered 2018-11-22: 1 [drp] via OPHTHALMIC
  Filled 2018-11-22: qty 15

## 2018-11-22 NOTE — Progress Notes (Signed)
Patient ID: Ellen [name], female   DOB: 07/05/1950, 68 y.o.   MRN: [id number]    Progress Note   Subjective   Day #2 CC; GI bleed Patient says she feels good, denies any lightheadedness or shortness of breath.  No abdominal pain.  She has been passing flatus, no bowel movement or blood since Saturday evening HGb 7.5 this am  Down from 8.4 yesterday   Objective   Vital signs in last 24 hours: Temp:  [98.1 F (36.7 C)-98.9 F (37.2 C)] 98.1 F (36.7 C) (09/28 0750) Pulse Rate:  [60-80] 64 (09/28 0750) Resp:  [16-20] 18 (09/28 0750) BP: (98-111)/(67-77) 111/68 (09/28 0750) SpO2:  [97 %-100 %] 99 % (09/28 0750) Last BM Date: 11/21/18 General: Pleasant African-American female in NAD Heart:  Regular rate and rhythm; no murmurs Lungs: Respirations even and unlabored, lungs CTA bilaterally Abdomen:  Soft, nontender and nondistended. Normal bowel sounds. Extremities:  Without edema. Neurologic:  Alert and oriented,  grossly normal neurologically. Psych:  Cooperative. Normal mood and affect.  Intake/Output from previous day: 09/27 0701 - 09/28 0700 In: 2063.8 [P.O.:360; I.V.:1703.8] Out: 1050 [Urine:1050] Intake/Output this shift: No intake/output data recorded.  Lab Results: Recent Labs    11/21/18 2037 11/22/18 0204 11/22/18 0816  WBC 4.9 4.3 3.4*  HGB 8.7* 7.5* 7.5*  HCT 27.5* 24.6* 24.7*  PLT 239 226 236   BMET Recent Labs    11/20/18 2035 11/21/18 0423  NA 138 139  K 3.2* 3.6  CL 106 112*  CO2 23 20*  GLUCOSE 127* 113*  BUN 18 20  CREATININE 0.89 0.56  CALCIUM 9.1 7.6*   LFT Recent Labs    11/21/18 0423  PROT 5.7*  ALBUMIN 3.2*  AST 13*  ALT 11  ALKPHOS 41  BILITOT 0.5   PT/INR No results for input(s): LABPROT, INR in the last 72 hours.  Studies/Results: Ct Angio Abd/pel W And/or Wo Contrast  Result Date: 11/20/2018 CLINICAL DATA:  Lower GI bleed EXAM: CTA ABDOMEN AND PELVIS wITHOUT AND WITH CONTRAST TECHNIQUE: Multidetector CT imaging of the  abdomen and pelvis was performed using the standard protocol during bolus administration of intravenous contrast. Multiplanar reconstructed images and MIPs were obtained and reviewed to evaluate the vascular anatomy. CONTRAST:  165mL OMNIPAQUE IOHEXOL 350 MG/ML SOLN COMPARISON:  CT 05/09/2017 FINDINGS: VASCULAR Aorta: Scattered atheromatous plaque of the otherwise normal caliber aorta. No evidence of aneurysm, dissection or vasculitis. Celiac: Patent without evidence of aneurysm, dissection, vasculitis or significant stenosis. SMA: Patent without evidence of aneurysm, dissection, vasculitis or significant stenosis. Renals: Single renal arteries bilaterally. Both renal arteries are patent without evidence of aneurysm, dissection, vasculitis or features of fibromuscular dysplasia. IMA: Calcified plaque results in moderate to severe narrowing at the ostium of the inferior mesenteric artery distal vessel normally opacifies possibly supplied by colic collaterals. Inflow: Proximal inflow vessels demonstrate atheromatous plaque. No significant stenosis. No evidence of aneurysm, dissection or vasculitis. Proximal Outflow: Bilateral common femoral and visualized portions of the superficial and profunda femoral arteries are patent without evidence of aneurysm, dissection, vasculitis or significant stenosis. Veins: Portal and hepatic veins normally opacify on delayed phase imaging. Review of the MIP images confirms the above findings. NON-VASCULAR Lower chest: Bandlike area of scarring in the right middle lobe. Lung bases are otherwise unremarkable. Hepatobiliary: Multiple fluid attenuation cysts are present throughout the left and right lobes of the liver. Additional subcentimeter hypoattenuating foci are too small to fully characterize though statistically likely benign and possibly reflective additional  cysts. The gallbladder is normal. No biliary ductal dilatation. Pancreas: Unremarkable. No pancreatic ductal dilatation or  surrounding inflammatory changes. Spleen: Normal in size without focal abnormality. Adrenals/Urinary Tract: Adrenal glands are unremarkable. Kidneys are normal, without renal calculi, focal lesion, or hydronephrosis. Bladder is unremarkable. Stomach/Bowel: Distal esophagus, stomach and duodenal sweep are unremarkable. No bowel wall thickening or dilatation. No evidence of obstruction. A normal appendix is visualized. There are right and left-sided colonic diverticula. There is a blush of high-density contrast material within the cecum seen on arterial phase with expansion by the portal venous phase concerning for active contrast extravasation, possibly diverticular bleeding. Colon is fluid-filled. No colonic dilatation or wall thickening. Lymphatic: No suspicious or enlarged lymph nodes in the included lymphatic chains. Reproductive: Uterus is surgically absent. No concerning adnexal lesions. Other: No abdominopelvic free fluid or free gas. No bowel containing hernias. Musculoskeletal: No acute osseous abnormality or suspicious osseous lesion. Circumscribed fat attenuation lesion arising subjacent to the right latissimus dorsi. Incompletely included in the level of imaging measuring up to 9.2 cm in size. IMPRESSION: VASCULAR Focus of active contrast extravasation the proximal cecum. Patient does demonstrate right-sided colonic diverticula with minimal adjacent inflammation, suggesting possible diverticular etiology for this lower GI bleed. 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 NON-VASCULAR Circumscribed fat attenuation lesion subjacent to the right latissimus dorsi. 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 If pain or symptoms develop, consider reimaging. Electronically Signed   By: Lovena Le M.D.   On: 11/20/2018 23:54       Assessment / Plan:    #56 68 year old African-American  female admitted with acute lower GI bleed in setting of history of recurrent diverticular bleeds.  Hemoglobin down about 5 g since onset of bleed, has not required transfusion No active bleeding in 36 hours CT angiography did show an area of extravasation in the proximal cecum 11/20/2018.  #2 anemia-secondary to acute GI bleed  Plan; advance to soft diet today Check follow-up hemoglobin this afternoon, and if stable okay for discharge to home. Patient had been taking a 325 mg aspirin daily at home for colon cancer prevention.  Advised to stay off of aspirin altogether for at least a week and then okay to resume that at 81 mg p.o. daily, given her history of recurrent diverticular bleeds.     Principal Problem:   Rectal bleed Active Problems:   HTN (hypertension)   History of tobacco use   Hypokalemia     LOS: 2 days   Amy Esterwood PA-C 11/22/2018, 9:22 AM .

## 2018-11-22 NOTE — Progress Notes (Signed)
PROGRESS NOTE    Ellen [name]  [id number] DOB: 1950-03-31 DOA: 11/20/2018 PCP: Nolene Ebbs, MD    Brief Narrative:  68 year old female with history of breast cancer, previous diverticular bleed, diabetes, hypertension and hyperlipidemia.  Patient also on maintenance aspirin at home.  Presented to the hospital secondary to rectal bleeding, painless and bright rectal bleed.  Admitted with acute lower GI bleeding.   Assessment & Plan:   Principal Problem:   Rectal bleed Active Problems:   HTN (hypertension)   History of tobacco use   Hypokalemia   Anemia of acute blood loss, acute lower GI bleeding: Likely diverticular bleed with history of diverticular bleeding.  CTA of the abdomen pelvis was significant for contrast extravasation on proximal cecum on arrival.  No intervention done. Hemoglobin dropped from 11.9-8.4-7.5. No more bleeding now.  However, patient does not have a bowel movement yet. Aspirin is stopped. Followed by gastroenterology, anticipating conservative management. Advance diet and monitor. Need to wait for bowel movement to ensure stabilization before discharge.  Acute blood loss anemia: Hemoglobin 7.5.  Vitals are stable.  No indication for transfusion now.  Hypokalemia: Replaced.  Type 2 diabetes: Well controlled.  Metformin as outpatient.  Hemoglobin A1c 5.9.  Currently on sliding scale insulin.  Resume metformin on discharge.  Hypertension: Blood pressures are stable.  On amlodipine that has been on hold because of acute GI bleeding.  Blood pressures are acceptable.   DVT prophylaxis: SCDs Code Status: Full code Family Communication: None Disposition Plan: Home.  Anticipate tomorrow.   Consultants:   Gastroenterology  Procedures:   None  Antimicrobials:   None   Subjective: Patient seen and examined.  No overnight events.  Her last bowel movement was night before and had blood mixed stool.  She has not have any bowel movement since  then.  Appetite is fair.  Hemoglobin 7.5.  Objective: Vitals:   11/21/18 2107 11/22/18 0525 11/22/18 0750 11/22/18 1356  BP: 110/67 103/77 111/68 140/79  Pulse: 80 60 64 72  Resp: 20 16 18 19   Temp: 98.9 F (37.2 C) 98.1 F (36.7 C) 98.1 F (36.7 C) 98.3 F (36.8 C)  TempSrc:   Oral Oral  SpO2: 97% 100% 99% 100%  Weight:      Height:        Intake/Output Summary (Last 24 hours) at 11/22/2018 1416 Last data filed at 11/22/2018 1245 Gross per 24 hour  Intake 2303.79 ml  Output 900 ml  Net 1403.79 ml   Filed Weights   11/20/18 1942  Weight: 69.9 kg    Examination:  General exam: Appears calm and comfortable  Respiratory system: Clear to auscultation. Respiratory effort normal. Cardiovascular system: S1 & S2 heard, RRR. No JVD, murmurs, rubs, gallops or clicks. No pedal edema. Gastrointestinal system: Abdomen is nondistended, soft and nontender. No organomegaly or masses felt. Normal bowel sounds heard. Central nervous system: Alert and oriented. No focal neurological deficits. Extremities: Symmetric 5 x 5 power. Skin: No rashes, lesions or ulcers Psychiatry: Judgement and insight appear normal. Mood & affect appropriate.     Data Reviewed: I have personally reviewed following labs and imaging studies  CBC: Recent Labs  Lab 11/21/18 1020 11/21/18 1350 11/21/18 2037 11/22/18 0204 11/22/18 0816  WBC 6.6 6.2 4.9 4.3 3.4*  NEUTROABS 4.4 3.8 2.2 1.6* 1.3*  HGB 7.6* 7.7* 8.7* 7.5* 7.5*  HCT 24.2* 24.1* 27.5* 24.6* 24.7*  MCV 86.4 87.3 89.3 89.1 88.8  PLT 240 212 239 226 236  Basic Metabolic Panel: Recent Labs  Lab 11/20/18 2035 11/21/18 0423  NA 138 139  K 3.2* 3.6  CL 106 112*  CO2 23 20*  GLUCOSE 127* 113*  BUN 18 20  CREATININE 0.89 0.56  CALCIUM 9.1 7.6*   GFR: Estimated Creatinine Clearance: 62.5 mL/min (by C-G formula based on SCr of 0.56 mg/dL). Liver Function Tests: Recent Labs  Lab 11/20/18 2035 11/21/18 0423  AST 16 13*  ALT 14 11   ALKPHOS 56 41  BILITOT 0.2* 0.5  PROT 7.1 5.7*  ALBUMIN 4.2 3.2*   No results for input(s): LIPASE, AMYLASE in the last 168 hours. No results for input(s): AMMONIA in the last 168 hours. Coagulation Profile: No results for input(s): INR, PROTIME in the last 168 hours. Cardiac Enzymes: No results for input(s): CKTOTAL, CKMB, CKMBINDEX, TROPONINI in the last 168 hours. BNP (last 3 results) No results for input(s): PROBNP in the last 8760 hours. HbA1C: Recent Labs    11/21/18 0423  HGBA1C 5.9*   CBG: Recent Labs  Lab 11/21/18 1155 11/21/18 1636 11/21/18 2104 11/22/18 0745 11/22/18 1142  GLUCAP 122* 110* 128* 97 85   Lipid Profile: No results for input(s): CHOL, HDL, LDLCALC, TRIG, CHOLHDL, LDLDIRECT in the last 72 hours. Thyroid Function Tests: No results for input(s): TSH, T4TOTAL, FREET4, T3FREE, THYROIDAB in the last 72 hours. Anemia Panel: No results for input(s): VITAMINB12, FOLATE, FERRITIN, TIBC, IRON, RETICCTPCT in the last 72 hours. Sepsis Labs: No results for input(s): PROCALCITON, LATICACIDVEN in the last 168 hours.  Recent Results (from the past 240 hour(s))  SARS Coronavirus 2 [facility] order, Performed in [facility] hospital lab) Nasopharyngeal Nasopharyngeal Swab     Status: None   Collection Time: 11/21/18  1:28 AM   Specimen: Nasopharyngeal Swab  Result Value Ref Range Status   SARS Coronavirus 2 NEGATIVE NEGATIVE Final    Comment: (NOTE) If result is NEGATIVE SARS-CoV-2 target nucleic acids are NOT DETECTED. The SARS-CoV-2 RNA is generally detectable in upper and lower  respiratory specimens during the acute phase of infection. The lowest  concentration of SARS-CoV-2 viral copies this assay can detect is 250  copies / mL. A negative result does not preclude SARS-CoV-2 infection  and should not be used as the sole basis for treatment or other  patient management decisions.  A negative result may occur with  improper specimen collection / handling,  submission of specimen other  than nasopharyngeal swab, presence of viral mutation(s) within the  areas targeted by this assay, and inadequate number of viral copies  (<250 copies / mL). A negative result must be combined with clinical  observations, patient history, and epidemiological information. If result is POSITIVE SARS-CoV-2 target nucleic acids are DETECTED. The SARS-CoV-2 RNA is generally detectable in upper and lower  respiratory specimens dur ing the acute phase of infection.  Positive  results are indicative of active infection with SARS-CoV-2.  Clinical  correlation with patient history and other diagnostic information is  necessary to determine patient infection status.  Positive results do  not rule out bacterial infection or co-infection with other viruses. If result is PRESUMPTIVE POSTIVE SARS-CoV-2 nucleic acids MAY BE PRESENT.   A presumptive positive result was obtained on the submitted specimen  and confirmed on repeat testing.  While 2019 novel coronavirus  (SARS-CoV-2) nucleic acids may be present in the submitted sample  additional confirmatory testing may be necessary for epidemiological  and / or clinical management purposes  to differentiate between  SARS-CoV-2 and  other Sarbecovirus currently known to infect humans.  If clinically indicated additional testing with an alternate test  methodology [phone]) is advised. The SARS-CoV-2 RNA is generally  detectable in upper and lower respiratory sp ecimens during the acute  phase of infection. The expected result is Negative. Fact Sheet for Patients:  StrictlyIdeas.no Fact Sheet for Healthcare Providers: BankingDealers.co.za This test is not yet approved or cleared by the [place] FDA and has been authorized for detection and/or diagnosis of SARS-CoV-2 by FDA under an Emergency Use Authorization (EUA).  This EUA will remain in effect (meaning this test can be  used) for the duration of the COVID-19 declaration under Section 564(b)(1) of the Act, 21 U.S.C. section 360bbb-3(b)(1), unless the authorization is terminated or revoked sooner. Performed at [facility], [place] [address] [place], [place] [address]          Radiology Studies: Ct Angio Abd/pel W And/or Wo Contrast  Result Date: 11/20/2018 CLINICAL DATA:  Lower GI bleed EXAM: CTA ABDOMEN AND PELVIS wITHOUT AND WITH CONTRAST TECHNIQUE: Multidetector CT imaging of the abdomen and pelvis was performed using the standard protocol during bolus administration of intravenous contrast. Multiplanar reconstructed images and MIPs were obtained and reviewed to evaluate the vascular anatomy. CONTRAST:  158mL OMNIPAQUE IOHEXOL 350 MG/ML SOLN COMPARISON:  CT 05/09/2017 FINDINGS: VASCULAR Aorta: Scattered atheromatous plaque of the otherwise normal caliber aorta. No evidence of aneurysm, dissection or vasculitis. Celiac: Patent without evidence of aneurysm, dissection, vasculitis or significant stenosis. SMA: Patent without evidence of aneurysm, dissection, vasculitis or significant stenosis. Renals: Single renal arteries bilaterally. Both renal arteries are patent without evidence of aneurysm, dissection, vasculitis or features of fibromuscular dysplasia. IMA: Calcified plaque results in moderate to severe narrowing at the ostium of the inferior mesenteric artery distal vessel normally opacifies possibly supplied by colic collaterals. Inflow: Proximal inflow vessels demonstrate atheromatous plaque. No significant stenosis. No evidence of aneurysm, dissection or vasculitis. Proximal Outflow: Bilateral common femoral and visualized portions of the superficial and profunda femoral arteries are patent without evidence of aneurysm, dissection, vasculitis or significant stenosis. Veins: Portal and hepatic veins normally opacify on delayed phase imaging. Review of the MIP images confirms the above  findings. NON-VASCULAR Lower chest: Bandlike area of scarring in the right middle lobe. Lung bases are otherwise unremarkable. Hepatobiliary: Multiple fluid attenuation cysts are present throughout the left and right lobes of the liver. Additional subcentimeter hypoattenuating foci are too small to fully characterize though statistically likely benign and possibly reflective additional cysts. The gallbladder is normal. No biliary ductal dilatation. Pancreas: Unremarkable. No pancreatic ductal dilatation or surrounding inflammatory changes. Spleen: Normal in size without focal abnormality. Adrenals/Urinary Tract: Adrenal glands are unremarkable. Kidneys are normal, without renal calculi, focal lesion, or hydronephrosis. Bladder is unremarkable. Stomach/Bowel: Distal esophagus, stomach and duodenal sweep are unremarkable. No bowel wall thickening or dilatation. No evidence of obstruction. A normal appendix is visualized. There are right and left-sided colonic diverticula. There is a blush of high-density contrast material within the cecum seen on arterial phase with expansion by the portal venous phase concerning for active contrast extravasation, possibly diverticular bleeding. Colon is fluid-filled. No colonic dilatation or wall thickening. Lymphatic: No suspicious or enlarged lymph nodes in the included lymphatic chains. Reproductive: Uterus is surgically absent. No concerning adnexal lesions. Other: No abdominopelvic free fluid or free gas. No bowel containing hernias. Musculoskeletal: No acute osseous abnormality or suspicious osseous lesion. Circumscribed fat attenuation lesion arising subjacent to the right latissimus dorsi. Incompletely included  in the level of imaging measuring up to 9.2 cm in size. IMPRESSION: VASCULAR Focus of active contrast extravasation the proximal cecum. Patient does demonstrate right-sided colonic diverticula with minimal adjacent inflammation, suggesting possible diverticular  etiology for this lower GI bleed. 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 NON-VASCULAR Circumscribed fat attenuation lesion subjacent to the right latissimus dorsi. 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 If pain or symptoms develop, consider reimaging. Electronically Signed   By: Lovena Le M.D.   On: 11/20/2018 23:54        Scheduled Meds:  insulin aspart  0-9 Units Subcutaneous TID WC   pantoprazole (PROTONIX) IV  40 mg Intravenous Q12H   Continuous Infusions:  sodium chloride Stopped (11/22/18 0159)     LOS: 2 days    Time spent: 25 minutes    Barb Merino, MD [facility] Pager [phone]  If 7PM-7AM, please contact night-coverage [email or web address] Password [email or web address] 11/22/2018, 2:16 PM

## 2018-11-23 DIAGNOSIS — D62 Acute posthemorrhagic anemia: Secondary | ICD-10-CM

## 2018-11-23 DIAGNOSIS — K5731 Diverticulosis of large intestine without perforation or abscess with bleeding: Secondary | ICD-10-CM

## 2018-11-23 LAB — CBC WITH DIFFERENTIAL/PLATELET
Abs Immature Granulocytes: 0.04 10*3/uL (ref 0.00–0.07)
Basophils Absolute: 0 10*3/uL (ref 0.0–0.1)
Basophils Relative: 1 %
Eosinophils Absolute: 0.3 10*3/uL (ref 0.0–0.5)
Eosinophils Relative: 5 %
HCT: 24.4 % — ABNORMAL LOW (ref 36.0–46.0)
Hemoglobin: 7.6 g/dL — ABNORMAL LOW (ref 12.0–15.0)
Immature Granulocytes: 1 %
Lymphocytes Relative: 37 %
Lymphs Abs: 1.9 10*3/uL (ref 0.7–4.0)
MCH: 27.8 pg (ref 26.0–34.0)
MCHC: 31.1 g/dL (ref 30.0–36.0)
MCV: 89.4 fL (ref 80.0–100.0)
Monocytes Absolute: 0.5 10*3/uL (ref 0.1–1.0)
Monocytes Relative: 10 %
Neutro Abs: 2.5 10*3/uL (ref 1.7–7.7)
Neutrophils Relative %: 46 %
Platelets: 235 10*3/uL (ref 150–400)
RBC: 2.73 MIL/uL — ABNORMAL LOW (ref 3.87–5.11)
RDW: 15.2 % (ref 11.5–15.5)
WBC: 5.2 10*3/uL (ref 4.0–10.5)
nRBC: 0 % (ref 0.0–0.2)

## 2018-11-23 LAB — HEMOGLOBIN AND HEMATOCRIT, BLOOD
HCT: 27 % — ABNORMAL LOW (ref 36.0–46.0)
HCT: 27.8 % — ABNORMAL LOW (ref 36.0–46.0)
Hemoglobin: 8.4 g/dL — ABNORMAL LOW (ref 12.0–15.0)
Hemoglobin: 8.5 g/dL — ABNORMAL LOW (ref 12.0–15.0)

## 2018-11-23 LAB — GLUCOSE, CAPILLARY
Glucose-Capillary: 98 mg/dL (ref 70–99)
Glucose-Capillary: 118 mg/dL — ABNORMAL HIGH (ref 70–99)
Glucose-Capillary: 110 mg/dL — ABNORMAL HIGH (ref 70–99)
Glucose-Capillary: 103 mg/dL — ABNORMAL HIGH (ref 70–99)

## 2018-11-23 NOTE — Progress Notes (Signed)
Patient ID: Ellen [name], female   DOB: [birth year], 68 y.o.   MRN: [id number]    Progress Note   Subjective   Day # 3 CC: acute GI bleed  Patient had one episode of dark bloody stool yesterday afternoon, she has not had any further bowel movements, and feels okay today. She says she is scared at this point because of the recurrence of blood yesterday.  HGB 7.6 this am, overall stable over the last 24 hours.   Objective   Vital signs in last 24 hours: Temp:  [98.1 F (36.7 C)-98.5 F (36.9 C)] 98.5 F (36.9 C) (09/29 0544) Pulse Rate:  [63-72] 63 (09/29 0544) Resp:  [14-19] 16 (09/29 0544) BP: (125-140)/(77-80) 125/80 (09/29 0544) SpO2:  [97 %-100 %] 97 % (09/29 0544) Last BM Date: 11/21/18 General:    African-American female in NAD Heart:  Regular rate and rhythm; no murmurs Lungs: Respirations even and unlabored, lungs CTA bilaterally Abdomen:  Soft, nontender and nondistended. Normal bowel sounds. Extremities:  Without edema. Neurologic:  Alert and oriented,  grossly normal neurologically. Psych:  Cooperative. Normal mood and affect.  Intake/Output from previous day: 09/28 0701 - 09/29 0700 In: 4386.9 [P.O.:2340; I.V.:2046.9] Out: 1300 [Urine:1300] Intake/Output this shift: No intake/output data recorded.  Lab Results: Recent Labs    11/22/18 0816 11/22/18 1406 11/23/18 0436  WBC 3.4* 4.8 5.2  HGB 7.5* 8.7* 7.6*  HCT 24.7* 29.2* 24.4*  PLT 236 257 235   BMET Recent Labs    11/20/18 2035 11/21/18 0423  NA 138 139  K 3.2* 3.6  CL 106 112*  CO2 23 20*  GLUCOSE 127* 113*  BUN 18 20  CREATININE 0.89 0.56  CALCIUM 9.1 7.6*   LFT Recent Labs    11/21/18 0423  PROT 5.7*  ALBUMIN 3.2*  AST 13*  ALT 11  ALKPHOS 41  BILITOT 0.5   PT/INR No results for input(s): LABPROT, INR in the last 72 hours.      Assessment / Plan:    #67 68 year old African-American female with acute recurrent diverticular bleed. CT angio positive on 11/20/2022 area of  extravasation in the proximal cecum.  Hemoglobin down 5 g since onset of bleeding, has not required transfusion She had one episode of dark bloody stool yesterday. Hemoglobin stable at 7.6 this morning  Plan; continue soft diet Discussed with patient, follow hemoglobin and would observe until tomorrow a.m. to assure no recurrence of hemorrhage, and if stable can be discharged home in a.m.   Principal Problem:   Rectal bleed Active Problems:   HTN (hypertension)   History of tobacco use   Hypokalemia     LOS: 3 days    EsterwoodPA-C  11/23/2018, 9:02 AM

## 2018-11-23 NOTE — Progress Notes (Signed)
PROGRESS NOTE    [name]  [id number] DOB: 1950-04-06 DOA: 11/20/2018 PCP: Ellen Ebbs, MD    Brief Narrative:  68 year old female with history of breast cancer, previous diverticular bleed, diabetes, hypertension and hyperlipidemia.  Patient also on maintenance aspirin at home.  Presented to the hospital secondary to rectal bleeding, painless and bright rectal bleed.  Admitted with acute lower GI bleeding.   Assessment & Plan:   Principal Problem:   Rectal bleed Active Problems:   HTN (hypertension)   History of tobacco use   Hypokalemia  Anemia of acute blood loss, acute lower GI bleeding: Likely diverticular bleed with history of diverticular bleeding.  CTA of the abdomen pelvis was significant for contrast extravasation on proximal cecum on arrival.  No intervention done. Hemoglobin dropped from 11.9-8.4-7.5-7.6 Patient was on aspirin 325 mg daily at home, stopped. Followed by gastroenterology, anticipating conservative management. Advancing diet. Last bowel movement with bleeding. Protonix is not indicated for diverticular bleed.  Will discontinue.  Acute blood loss anemia: Hemoglobin 7.6.  Vitals are stable.  No indication for transfusion now.  Check every 12 hours to ensure stabilization.  Hypokalemia: Replaced.  Type 2 diabetes: Well controlled.  Metformin as outpatient.  Hemoglobin A1c 5.9.  Currently on sliding scale insulin.  Resume metformin on discharge.  Hypertension: Blood pressures are stable.  On amlodipine that has been on hold because of acute GI bleeding.  Blood pressures are acceptable.   DVT prophylaxis: SCDs Code Status: Full code Family Communication: None Disposition Plan: Home.  Anticipate tomorrow.   Consultants:   Gastroenterology  Procedures:   None  Antimicrobials:   None   Subjective: Patient seen and examined.  No overnight events.  Her last bowel movement was night before and had blood mixed stool.  She has not  have any bowel movement since then.  Appetite is fair.  Hemoglobin 7.5.  Objective: Vitals:   11/22/18 0750 11/22/18 1356 11/22/18 2115 11/23/18 0544  BP: 111/68 140/79 135/77 125/80  Pulse: 64 72 72 63  Resp: 18 19 14 16   Temp: 98.1 F (36.7 C) 98.3 F (36.8 C) 98.1 F (36.7 C) 98.5 F (36.9 C)  TempSrc: Oral Oral Oral Oral  SpO2: 99% 100% 100% 97%  Weight:      Height:        Intake/Output Summary (Last 24 hours) at 11/23/2018 1045 Last data filed at 11/23/2018 1000 Gross per 24 hour  Intake 4005.32 ml  Output 1300 ml  Net 2705.32 ml   Filed Weights   11/20/18 1942  Weight: 69.9 kg    Examination:  General exam: Appears calm and comfortable  Respiratory system: Clear to auscultation. Respiratory effort normal. Cardiovascular system: S1 & S2 heard, RRR. No JVD, murmurs, rubs, gallops or clicks. No pedal edema. Gastrointestinal system: Abdomen is nondistended, soft and nontender. No organomegaly or masses felt. Normal bowel sounds heard. Central nervous system: Alert and oriented. No focal neurological deficits. Extremities: Symmetric 5 x 5 power. Skin: No rashes, lesions or ulcers Psychiatry: Judgement and insight appear normal. Mood & affect appropriate.     Data Reviewed: I have personally reviewed following labs and imaging studies  CBC: Recent Labs  Lab 11/21/18 2037 11/22/18 0204 11/22/18 0816 11/22/18 1406 11/23/18 0436  WBC 4.9 4.3 3.4* 4.8 5.2  NEUTROABS 2.2 1.6* 1.3* 2.4 2.5  HGB 8.7* 7.5* 7.5* 8.7* 7.6*  HCT 27.5* 24.6* 24.7* 29.2* 24.4*  MCV 89.3 89.1 88.8 91.3 89.4  PLT 239 226 236 257 235  Basic Metabolic Panel: Recent Labs  Lab 11/20/18 2035 11/21/18 0423  NA 138 139  K 3.2* 3.6  CL 106 112*  CO2 23 20*  GLUCOSE 127* 113*  BUN 18 20  CREATININE 0.89 0.56  CALCIUM 9.1 7.6*   GFR: Estimated Creatinine Clearance: 62.5 mL/min (by C-G formula based on SCr of 0.56 mg/dL). Liver Function Tests: Recent Labs  Lab 11/20/18 2035  11/21/18 0423  AST 16 13*  ALT 14 11  ALKPHOS 56 41  BILITOT 0.2* 0.5  PROT 7.1 5.7*  ALBUMIN 4.2 3.2*   No results for input(s): LIPASE, AMYLASE in the last 168 hours. No results for input(s): AMMONIA in the last 168 hours. Coagulation Profile: No results for input(s): INR, PROTIME in the last 168 hours. Cardiac Enzymes: No results for input(s): CKTOTAL, CKMB, CKMBINDEX, TROPONINI in the last 168 hours. BNP (last 3 results) No results for input(s): PROBNP in the last 8760 hours. HbA1C: Recent Labs    11/21/18 0423  HGBA1C 5.9*   CBG: Recent Labs  Lab 11/22/18 0745 11/22/18 1142 11/22/18 1616 11/22/18 2114 11/23/18 0747  GLUCAP 97 85 105* 95 98   Lipid Profile: No results for input(s): CHOL, HDL, LDLCALC, TRIG, CHOLHDL, LDLDIRECT in the last 72 hours. Thyroid Function Tests: No results for input(s): TSH, T4TOTAL, FREET4, T3FREE, THYROIDAB in the last 72 hours. Anemia Panel: No results for input(s): VITAMINB12, FOLATE, FERRITIN, TIBC, IRON, RETICCTPCT in the last 72 hours. Sepsis Labs: No results for input(s): PROCALCITON, LATICACIDVEN in the last 168 hours.  Recent Results (from the past 240 hour(s))  SARS Coronavirus 2 [facility] order, Performed in [facility] hospital lab) Nasopharyngeal Nasopharyngeal Swab     Status: None   Collection Time: 11/21/18  1:28 AM   Specimen: Nasopharyngeal Swab  Result Value Ref Range Status   SARS Coronavirus 2 NEGATIVE NEGATIVE Final    Comment: (NOTE) If result is NEGATIVE SARS-CoV-2 target nucleic acids are NOT DETECTED. The SARS-CoV-2 RNA is generally detectable in upper and lower  respiratory specimens during the acute phase of infection. The lowest  concentration of SARS-CoV-2 viral copies this assay can detect is 250  copies / mL. A negative result does not preclude SARS-CoV-2 infection  and should not be used as the sole basis for treatment or other  patient management decisions.  A negative result may occur with   improper specimen collection / handling, submission of specimen other  than nasopharyngeal swab, presence of viral mutation(s) within the  areas targeted by this assay, and inadequate number of viral copies  (<250 copies / mL). A negative result must be combined with clinical  observations, patient history, and epidemiological information. If result is POSITIVE SARS-CoV-2 target nucleic acids are DETECTED. The SARS-CoV-2 RNA is generally detectable in upper and lower  respiratory specimens dur ing the acute phase of infection.  Positive  results are indicative of active infection with SARS-CoV-2.  Clinical  correlation with patient history and other diagnostic information is  necessary to determine patient infection status.  Positive results do  not rule out bacterial infection or co-infection with other viruses. If result is PRESUMPTIVE POSTIVE SARS-CoV-2 nucleic acids MAY BE PRESENT.   A presumptive positive result was obtained on the submitted specimen  and confirmed on repeat testing.  While 2019 novel coronavirus  (SARS-CoV-2) nucleic acids may be present in the submitted sample  additional confirmatory testing may be necessary for epidemiological  and / or clinical management purposes  to differentiate between  SARS-CoV-2 and  other Sarbecovirus currently known to infect humans.  If clinically indicated additional testing with an alternate test  methodology [phone]) is advised. The SARS-CoV-2 RNA is generally  detectable in upper and lower respiratory sp ecimens during the acute  phase of infection. The expected result is Negative. Fact Sheet for Patients:  StrictlyIdeas.no Fact Sheet for Healthcare Providers: BankingDealers.co.za This test is not yet approved or cleared by the [place] FDA and has been authorized for detection and/or diagnosis of SARS-CoV-2 by FDA under an Emergency Use Authorization (EUA).  This EUA will  remain in effect (meaning this test can be used) for the duration of the COVID-19 declaration under Section 564(b)(1) of the Act, 21 U.S.C. section 360bbb-3(b)(1), unless the authorization is terminated or revoked sooner. Performed at [facility], [place] [address] [place], [place] [address]          Radiology Studies: No results found.      Scheduled Meds: . insulin aspart  0-9 Units Subcutaneous TID WC   Continuous Infusions: . sodium chloride 75 mL/hr at 11/23/18 1000     LOS: 3 days    Time spent: 25 minutes    Barb Merino, MD [facility] Pager [phone]  If 7PM-7AM, please contact night-coverage [email or web address] Password [email or web address] 11/23/2018, 10:45 AM

## 2018-11-24 ENCOUNTER — Telehealth: Payer: Self-pay

## 2018-11-24 ENCOUNTER — Other Ambulatory Visit: Payer: Self-pay

## 2018-11-24 DIAGNOSIS — Z8719 Personal history of other diseases of the digestive system: Secondary | ICD-10-CM

## 2018-11-24 LAB — HEMOGLOBIN AND HEMATOCRIT, BLOOD
HCT: 26.7 % — ABNORMAL LOW (ref 36.0–46.0)
Hemoglobin: 8.2 g/dL — ABNORMAL LOW (ref 12.0–15.0)

## 2018-11-24 LAB — GLUCOSE, CAPILLARY
Glucose-Capillary: 103 mg/dL — ABNORMAL HIGH (ref 70–99)
Glucose-Capillary: 110 mg/dL — ABNORMAL HIGH (ref 70–99)

## 2018-11-24 NOTE — Plan of Care (Signed)
  Problem: Education: Goal: Knowledge of General Education information will improve Description: Including pain rating scale, medication(s)/side effects and non-pharmacologic comfort measures Outcome: Completed/Met   Problem: Health Behavior/Discharge Planning: Goal: Ability to manage health-related needs will improve Outcome: Completed/Met   Problem: Clinical Measurements: Goal: Ability to maintain clinical measurements within normal limits will improve 11/24/2018 1228 by Annie Sable, RN Outcome: Completed/Met 11/24/2018 0837 by Annie Sable, RN Outcome: Progressing Goal: Will remain free from infection Outcome: Completed/Met Goal: Diagnostic test results will improve 11/24/2018 1228 by Annie Sable, RN Outcome: Completed/Met 11/24/2018 0837 by Annie Sable, RN Outcome: Progressing Goal: Respiratory complications will improve Outcome: Completed/Met Goal: Cardiovascular complication will be avoided 11/24/2018 1228 by Annie Sable, RN Outcome: Completed/Met 11/24/2018 0837 by Annie Sable, RN Outcome: Progressing   Problem: Activity: Goal: Risk for activity intolerance will decrease Outcome: Completed/Met   Problem: Nutrition: Goal: Adequate nutrition will be maintained 11/24/2018 1228 by Annie Sable, RN Outcome: Completed/Met 11/24/2018 0837 by Annie Sable, RN Outcome: Progressing   Problem: Coping: Goal: Level of anxiety will decrease 11/24/2018 1228 by Annie Sable, RN Outcome: Completed/Met 11/24/2018 0837 by Annie Sable, RN Outcome: Progressing   Problem: Elimination: Goal: Will not experience complications related to bowel motility 11/24/2018 1228 by Annie Sable, RN Outcome: Completed/Met 11/24/2018 0837 by Annie Sable, RN Outcome: Progressing Goal: Will not experience complications related to urinary retention Outcome: Completed/Met   Problem: Pain Managment: Goal: General experience of comfort will improve Outcome:  Completed/Met   Problem: Safety: Goal: Ability to remain free from injury will improve 11/24/2018 1228 by Annie Sable, RN Outcome: Completed/Met 11/24/2018 0837 by Annie Sable, RN Outcome: Progressing   Problem: Skin Integrity: Goal: Risk for impaired skin integrity will decrease Outcome: Completed/Met   Problem: Education: Goal: Ability to identify signs and symptoms of gastrointestinal bleeding will improve Outcome: Completed/Met   Problem: Bowel/Gastric: Goal: Will show no signs and symptoms of gastrointestinal bleeding 11/24/2018 1228 by Annie Sable, RN Outcome: Completed/Met 11/24/2018 0837 by Annie Sable, RN Outcome: Progressing   Problem: Fluid Volume: Goal: Will show no signs and symptoms of excessive bleeding 11/24/2018 1228 by Annie Sable, RN Outcome: Completed/Met 11/24/2018 0837 by Annie Sable, RN Outcome: Progressing   Problem: Clinical Measurements: Goal: Complications related to the disease process, condition or treatment will be avoided or minimized 11/24/2018 1228 by Annie Sable, RN Outcome: Completed/Met 11/24/2018 0837 by Annie Sable, RN Outcome: Progressing

## 2018-11-24 NOTE — Discharge Instructions (Signed)
Gastrointestinal Bleeding °Gastrointestinal (GI) bleeding is bleeding somewhere along the digestive tract, between the mouth and the anus. This tract includes the mouth, esophagus, stomach, small intestine, large intestine, and anus. The large intestine is often called the colon. °GI bleeding can be caused by various problems. The severity of these problems can range from mild to serious or even life-threatening. If you have GI bleeding, you may find blood in your stools (feces), you may have black stools, or you may vomit blood. If there is a lot of bleeding, you may need to stay in the hospital. °What are the causes? °This condition may be caused by: °· Inflammation, irritation, or swelling of the esophagus (esophagitis). The esophagus is part of the body that moves food from your mouth to your stomach. °· Swollen veins in the rectum (hemorrhoids). °· Areas of painful tearing in the anus that are often caused by passing hard stool (anal fissures). °· Pouches that form on the colon over time, with age, and may bleed a lot (diverticulosis). °· Inflammation (diverticulitis) in areas with diverticulosis. This can cause pain, fever, and bloody stools, although bleeding may be mild. °· Growths (polyps) or cancer. Colon cancer often starts out as precancerous polyps. °· Gastritis and ulcers. With these, bleeding may come from the upper GI tract, near the stomach. °What increases the risk? °You are more likely to develop this condition if you: °· Have an infection in your stomach from a type of bacteria called Helicobacter pylori. °· Take certain medicines, such as: °? NSAIDs. °? Aspirin. °? Selective serotonin reuptake inhibitors (SSRIs). °? Steroids. °? Antiplatelet or anticoagulant medicines. °· Smoke. °· Drink alcohol. °What are the signs or symptoms? °Common symptoms of this condition include: °· Bright red blood in your vomit, or vomit that looks like coffee grounds. °· Bloody, black, or tarry stools. °? Bleeding  from the lower GI tract will usually cause red or maroon blood in the stools. °? Bleeding from the upper GI tract may cause black, tarry stools that are often stronger smelling than usual. °? In certain cases, if the bleeding is fast enough, the stools may be red. °· Pain or cramping in the abdomen. °How is this diagnosed? °This condition may be diagnosed based on: °· Your medical history and a physical exam. °· Various tests, such as: °? Blood tests. °? Stool tests. °? X-rays and other imaging tests. °? Esophagogastroduodenoscopy (EGD). In this test, a flexible, lighted tube is used to look at your esophagus, stomach, and small intestine. °? Colonoscopy. In this test, a flexible, lighted tube is used to look at your colon. °How is this treated? °Treatment for this condition depends on the cause of the bleeding. For example: °· For bleeding from the esophagus, stomach, small intestine, or colon, the health care provider doing your EGD or colonoscopy may be able to stop the bleeding as part of the procedure. °· Inflammation or infection of the colon can be treated with medicines. °· Certain rectal problems can be treated with creams, suppositories, or warm baths. °· Medicines may be given to reduce acid in your stomach. °· Surgery is sometimes needed. °· Blood transfusions are sometimes needed if a lot of blood has been lost. °If bleeding is mild, you may be allowed to go home. If there is a lot of bleeding, you will need to stay in the hospital for observation. °Follow these instructions at home: ° °· Take over-the-counter and prescription medicines only as told by your health care provider. °·   Eat foods that are high in fiber, such as beans, whole grains, and fresh fruits and vegetables. This will help to keep your stools soft. Eating 1-3 prunes each day works well for many people. °· Drink enough fluid to keep your urine pale yellow. °· Keep all follow-up visits as told by your health care provider. This is  important. °Contact a health care provider if: °· Your symptoms do not improve. °Get help right away if: °· Your bleeding does not stop. °· You feel light-headed or you faint. °· You feel weak. °· You have severe cramps in your back or abdomen. °· You pass large blood clots in your stool. °· Your symptoms are getting worse. °· You have chest pain or fast heartbeats. °Summary °· Gastrointestinal (GI) bleeding is bleeding somewhere along the digestive tract, between the mouth and anus. GI bleeding can be caused by various problems. The severity of these problems can range from mild to serious or even life-threatening. °· Treatment for this condition depends on the cause of the bleeding. °· Take over-the-counter and prescription medicines only as told by your health care provider. °· Keep all follow-up visits as told by your health care provider. This is important. °· Get help right away if your bleeding increases, your symptoms are getting worse, or you have new symptoms. °This information is not intended to replace advice given to you by your health care provider. Make sure you discuss any questions you have with your health care provider. °Document Released: 02/08/2000 Document Revised: 09/23/2017 Document Reviewed: 09/23/2017 °Elsevier Patient Education © 2020 [employer] ° °

## 2018-11-24 NOTE — Discharge Summary (Signed)
Physician Discharge Summary  Ellen [name] [id number] DOB: 01/02/1951 DOA: 11/20/2018  PCP: Nolene Ebbs, MD  Admit date: 11/20/2018 Discharge date: 11/24/2018  Admitted From: Home. Disposition: Home.  Recommendations for Outpatient Follow-up:  1. Follow up with PCP in 1-2 weeks Follow-up hemoglobin scheduled for 10/2 at the GI office Home Health: Not applicable Equipment/Devices: Not applicable  Discharge Condition: Stable CODE STATUS: Full code Diet recommendation: Low-carb diet  Discharge summary:  Patient with history of previous diverticular bleed, diabetes on metformin, hypertension hyperlipidemia.  She also takes maintenance aspirin at home.  She presented to the hospital with rectal bleeding that was painless and bright blood. Patient had drop in hemoglobin from 11.9-8.4-7.5-7.6-7.6.  CTA of the abdomen pelvis on arrival was significant for contrast extravasation on proximal cecum, no intervention was done.  Ultimately stabilized. Currently with normal bowel function.  She has minimal blood along with bowel movements, however mostly improved. Followed by gastroenterology and they suggested conservative management, avoid any ascites, recheck hemoglobin in the next 2 days to ensure stabilization. We will have outpatient follow-up with GI.  Other chronic medical problems remained stable and she will resume her statin, metformin and blood pressure medication.   Discharge Diagnoses:  Principal Problem:   Rectal bleed Active Problems:   HTN (hypertension)   History of tobacco use   Hypokalemia   Diverticulosis of colon with hemorrhage   Acute blood loss anemia    Discharge Instructions  Discharge Instructions    Call MD for:   Complete by: As directed    Worsening rectal bleeding   Diet Carb Modified   Complete by: As directed    Increase activity slowly   Complete by: As directed      Allergies as of 11/24/2018      Reactions   Ace Inhibitors Swelling   Simvastatin Swelling   lips      Medication List    STOP taking these medications   acetaminophen 325 MG tablet Commonly known as: Tylenol   naproxen 500 MG tablet Commonly known as: NAPROSYN     TAKE these medications   amLODipine 10 MG tablet Commonly known as: NORVASC Take 1 tablet (10 mg total) by mouth daily.   calcium-vitamin D 500-200 MG-UNIT tablet Commonly known as: OSCAL WITH D Take 1 tablet by mouth 2 (two) times daily.   cholecalciferol 25 MCG (1000 UT) tablet Commonly known as: VITAMIN D3 Take 2,000 Units by mouth daily.   hydroxypropyl methylcellulose / hypromellose 2.5 % ophthalmic solution Commonly known as: ISOPTO TEARS / GONIOVISC Place 1 drop into both eyes 3 (three) times daily as needed for dry eyes.   metFORMIN 500 MG tablet Commonly known as: GLUCOPHAGE Take 500 mg by mouth 2 (two) times daily.   pravastatin 40 MG tablet Commonly known as: PRAVACHOL Take 1 tablet (40 mg total) by mouth daily.       Allergies  Allergen Reactions  . Ace Inhibitors Swelling  . Simvastatin Swelling    lips    Consultations:  Gastroenterology   Procedures/Studies: Mm 3d Screen Breast Bilateral  Result Date: 11/02/2018 CLINICAL DATA:  Screening. EXAM: DIGITAL SCREENING BILATERAL MAMMOGRAM WITH TOMO AND CAD COMPARISON:  Previous exam(s). ACR Breast Density Category c: The breast tissue is heterogeneously dense, which may obscure small masses. FINDINGS: There are no findings suspicious for malignancy. Stable post lumpectomy changes on the left. Images were processed with CAD. IMPRESSION: No mammographic evidence of malignancy. A result letter of this screening mammogram will be mailed  directly to the patient. RECOMMENDATION: Screening mammogram in one year. (Code:SM-B-01Y) BI-RADS CATEGORY  2: Benign. Electronically Signed   By: Lajean Manes M.D.   On: 11/02/2018 11:45   Ct Angio Abd/pel W And/or Wo Contrast  Result Date: 11/20/2018 CLINICAL DATA:  Lower GI  bleed EXAM: CTA ABDOMEN AND PELVIS wITHOUT AND WITH CONTRAST TECHNIQUE: Multidetector CT imaging of the abdomen and pelvis was performed using the standard protocol during bolus administration of intravenous contrast. Multiplanar reconstructed images and MIPs were obtained and reviewed to evaluate the vascular anatomy. CONTRAST:  132mL OMNIPAQUE IOHEXOL 350 MG/ML SOLN COMPARISON:  CT 05/09/2017 FINDINGS: VASCULAR Aorta: Scattered atheromatous plaque of the otherwise normal caliber aorta. No evidence of aneurysm, dissection or vasculitis. Celiac: Patent without evidence of aneurysm, dissection, vasculitis or significant stenosis. SMA: Patent without evidence of aneurysm, dissection, vasculitis or significant stenosis. Renals: Single renal arteries bilaterally. Both renal arteries are patent without evidence of aneurysm, dissection, vasculitis or features of fibromuscular dysplasia. IMA: Calcified plaque results in moderate to severe narrowing at the ostium of the inferior mesenteric artery distal vessel normally opacifies possibly supplied by colic collaterals. Inflow: Proximal inflow vessels demonstrate atheromatous plaque. No significant stenosis. No evidence of aneurysm, dissection or vasculitis. Proximal Outflow: Bilateral common femoral and visualized portions of the superficial and profunda femoral arteries are patent without evidence of aneurysm, dissection, vasculitis or significant stenosis. Veins: Portal and hepatic veins normally opacify on delayed phase imaging. Review of the MIP images confirms the above findings. NON-VASCULAR Lower chest: Bandlike area of scarring in the right middle lobe. Lung bases are otherwise unremarkable. Hepatobiliary: Multiple fluid attenuation cysts are present throughout the left and right lobes of the liver. Additional subcentimeter hypoattenuating foci are too small to fully characterize though statistically likely benign and possibly reflective additional cysts. The  gallbladder is normal. No biliary ductal dilatation. Pancreas: Unremarkable. No pancreatic ductal dilatation or surrounding inflammatory changes. Spleen: Normal in size without focal abnormality. Adrenals/Urinary Tract: Adrenal glands are unremarkable. Kidneys are normal, without renal calculi, focal lesion, or hydronephrosis. Bladder is unremarkable. Stomach/Bowel: Distal esophagus, stomach and duodenal sweep are unremarkable. No bowel wall thickening or dilatation. No evidence of obstruction. A normal appendix is visualized. There are right and left-sided colonic diverticula. There is a blush of high-density contrast material within the cecum seen on arterial phase with expansion by the portal venous phase concerning for active contrast extravasation, possibly diverticular bleeding. Colon is fluid-filled. No colonic dilatation or wall thickening. Lymphatic: No suspicious or enlarged lymph nodes in the included lymphatic chains. Reproductive: Uterus is surgically absent. No concerning adnexal lesions. Other: No abdominopelvic free fluid or free gas. No bowel containing hernias. Musculoskeletal: No acute osseous abnormality or suspicious osseous lesion. Circumscribed fat attenuation lesion arising subjacent to the right latissimus dorsi. Incompletely included in the level of imaging measuring up to 9.2 cm in size. IMPRESSION: VASCULAR Focus of active contrast extravasation the proximal cecum. Patient does demonstrate right-sided colonic diverticula with minimal adjacent inflammation, suggesting possible diverticular etiology for this lower GI bleed. 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 NON-VASCULAR Circumscribed fat attenuation lesion subjacent to the right latissimus dorsi. 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 If pain or symptoms develop, consider reimaging. Electronically Signed   By:  Lovena Le M.D.   On: 11/20/2018 23:54       Subjective: Patient seen and examined.  No overnight events.  Minimal bright  blood with bowel movement last night.   Discharge Exam: Vitals:   11/23/18 2047 11/24/18 0449  BP: (!) 151/77 (!) 114/97  Pulse: 68 72  Resp: 18 18  Temp: 98.5 F (36.9 C) 98.3 F (36.8 C)  SpO2: 99% 97%   Vitals:   11/23/18 0544 11/23/18 1342 11/23/18 2047 11/24/18 0449  BP: 125/80 128/80 (!) 151/77 (!) 114/97  Pulse: 63 67 68 72  Resp: 16 15 18 18   Temp: 98.5 F (36.9 C) 98.5 F (36.9 C) 98.5 F (36.9 C) 98.3 F (36.8 C)  TempSrc: Oral Oral Oral Oral  SpO2: 97% 100% 99% 97%  Weight:      Height:        General: Pt is alert, awake, not in acute distress Cardiovascular: RRR, S1/S2 +, no rubs, no gallops Respiratory: CTA bilaterally, no wheezing, no rhonchi Abdominal: Soft, NT, ND, bowel sounds + Extremities: no edema, no cyanosis    The results of significant diagnostics from this hospitalization (including imaging, microbiology, ancillary and laboratory) are listed below for reference.     Microbiology: Recent Results (from the past 240 hour(s))  SARS Coronavirus 2 [facility] order, Performed in [facility] hospital lab) Nasopharyngeal Nasopharyngeal Swab     Status: None   Collection Time: 11/21/18  1:28 AM   Specimen: Nasopharyngeal Swab  Result Value Ref Range Status   SARS Coronavirus 2 NEGATIVE NEGATIVE Final    Comment: (NOTE) If result is NEGATIVE SARS-CoV-2 target nucleic acids are NOT DETECTED. The SARS-CoV-2 RNA is generally detectable in upper and lower  respiratory specimens during the acute phase of infection. The lowest  concentration of SARS-CoV-2 viral copies this assay can detect is 250  copies / mL. A negative result does not preclude SARS-CoV-2 infection  and should not be used as the sole basis for treatment or other  patient management decisions.  A negative result may occur with  improper specimen collection /  handling, submission of specimen other  than nasopharyngeal swab, presence of viral mutation(s) within the  areas targeted by this assay, and inadequate number of viral copies  (<250 copies / mL). A negative result must be combined with clinical  observations, patient history, and epidemiological information. If result is POSITIVE SARS-CoV-2 target nucleic acids are DETECTED. The SARS-CoV-2 RNA is generally detectable in upper and lower  respiratory specimens dur ing the acute phase of infection.  Positive  results are indicative of active infection with SARS-CoV-2.  Clinical  correlation with patient history and other diagnostic information is  necessary to determine patient infection status.  Positive results do  not rule out bacterial infection or co-infection with other viruses. If result is PRESUMPTIVE POSTIVE SARS-CoV-2 nucleic acids MAY BE PRESENT.   A presumptive positive result was obtained on the submitted specimen  and confirmed on repeat testing.  While 2019 novel coronavirus  (SARS-CoV-2) nucleic acids may be present in the submitted sample  additional confirmatory testing may be necessary for epidemiological  and / or clinical management purposes  to differentiate between  SARS-CoV-2 and other Sarbecovirus currently known to infect humans.  If clinically indicated additional testing with an alternate test  methodology [phone]) is advised. The SARS-CoV-2 RNA is generally  detectable in upper and lower respiratory sp ecimens during the acute  phase of infection. The expected result is Negative. Fact Sheet for Patients:  StrictlyIdeas.no Fact Sheet for Healthcare Providers: BankingDealers.co.za This test is not yet approved or cleared by the [place] FDA and has been authorized  for detection and/or diagnosis of SARS-CoV-2 by FDA under an Emergency Use Authorization (EUA).  This EUA will remain in effect (meaning this  test can be used) for the duration of the COVID-19 declaration under Section 564(b)(1) of the Act, 21 U.S.C. section 360bbb-3(b)(1), unless the authorization is terminated or revoked sooner. Performed at [facility], [place] [address], [place], [place] [address]      Labs: BNP (last 3 results) No results for input(s): BNP in the last 8760 hours. Basic Metabolic Panel: Recent Labs  Lab 11/20/18 2035 11/21/18 0423  NA 138 139  K 3.2* 3.6  CL 106 112*  CO2 23 20*  GLUCOSE 127* 113*  BUN 18 20  CREATININE 0.89 0.56  CALCIUM 9.1 7.6*   Liver Function Tests: Recent Labs  Lab 11/20/18 2035 11/21/18 0423  AST 16 13*  ALT 14 11  ALKPHOS 56 41  BILITOT 0.2* 0.5  PROT 7.1 5.7*  ALBUMIN 4.2 3.2*   No results for input(s): LIPASE, AMYLASE in the last 168 hours. No results for input(s): AMMONIA in the last 168 hours. CBC: Recent Labs  Lab 11/21/18 2037 11/22/18 0204 11/22/18 0816 11/22/18 1406 11/23/18 0436 11/23/18 1640 11/23/18 2253 11/24/18 1043  WBC 4.9 4.3 3.4* 4.8 5.2  --   --   --   NEUTROABS 2.2 1.6* 1.3* 2.4 2.5  --   --   --   HGB 8.7* 7.5* 7.5* 8.7* 7.6* 8.4* 8.5* 8.2*  HCT 27.5* 24.6* 24.7* 29.2* 24.4* 27.0* 27.8* 26.7*  MCV 89.3 89.1 88.8 91.3 89.4  --   --   --   PLT 239 226 236 257 235  --   --   --    Cardiac Enzymes: No results for input(s): CKTOTAL, CKMB, CKMBINDEX, TROPONINI in the last 168 hours. BNP: Invalid input(s): POCBNP CBG: Recent Labs  Lab 11/23/18 0747 11/23/18 1151 11/23/18 1628 11/23/18 2100 11/24/18 0751  GLUCAP 98 118* 110* 103* 103*   D-Dimer No results for input(s): DDIMER in the last 72 hours. Hgb A1c No results for input(s): HGBA1C in the last 72 hours. Lipid Profile No results for input(s): CHOL, HDL, LDLCALC, TRIG, CHOLHDL, LDLDIRECT in the last 72 hours. Thyroid function studies No results for input(s): TSH, T4TOTAL, T3FREE, THYROIDAB in the last 72 hours.  Invalid input(s): FREET3 Anemia work  up No results for input(s): VITAMINB12, FOLATE, FERRITIN, TIBC, IRON, RETICCTPCT in the last 72 hours. Urinalysis    Component Value Date/Time   COLORURINE STRAW (A) 04/22/2017 1938   APPEARANCEUR CLEAR 04/22/2017 1938   LABSPEC 1.010 05/01/2017 1705   PHURINE 7.5 05/01/2017 1705   GLUCOSEU NEGATIVE 05/01/2017 1705   HGBUR TRACE (A) 05/01/2017 1705   BILIRUBINUR NEGATIVE 05/01/2017 1705   BILIRUBINUR Negative 05/30/2014 1508   KETONESUR NEGATIVE 05/01/2017 1705   PROTEINUR NEGATIVE 05/01/2017 1705   UROBILINOGEN 0.2 05/01/2017 1705   NITRITE NEGATIVE 05/01/2017 1705   LEUKOCYTESUR NEGATIVE 05/01/2017 1705   Sepsis Labs Invalid input(s): PROCALCITONIN,  WBC,  LACTICIDVEN Microbiology Recent Results (from the past 240 hour(s))  SARS Coronavirus 2 [facility] order, Performed in [facility] hospital lab) Nasopharyngeal Nasopharyngeal Swab     Status: None   Collection Time: 11/21/18  1:28 AM   Specimen: Nasopharyngeal Swab  Result Value Ref Range Status   SARS Coronavirus 2 NEGATIVE NEGATIVE Final    Comment: (NOTE) If result is NEGATIVE SARS-CoV-2 target nucleic acids are NOT DETECTED. The SARS-CoV-2 RNA is generally detectable in upper and lower  respiratory  specimens during the acute phase of infection. The lowest  concentration of SARS-CoV-2 viral copies this assay can detect is 250  copies / mL. A negative result does not preclude SARS-CoV-2 infection  and should not be used as the sole basis for treatment or other  patient management decisions.  A negative result may occur with  improper specimen collection / handling, submission of specimen other  than nasopharyngeal swab, presence of viral mutation(s) within the  areas targeted by this assay, and inadequate number of viral copies  (<250 copies / mL). A negative result must be combined with clinical  observations, patient history, and epidemiological information. If result is POSITIVE SARS-CoV-2 target nucleic acids are  DETECTED. The SARS-CoV-2 RNA is generally detectable in upper and lower  respiratory specimens dur ing the acute phase of infection.  Positive  results are indicative of active infection with SARS-CoV-2.  Clinical  correlation with patient history and other diagnostic information is  necessary to determine patient infection status.  Positive results do  not rule out bacterial infection or co-infection with other viruses. If result is PRESUMPTIVE POSTIVE SARS-CoV-2 nucleic acids MAY BE PRESENT.   A presumptive positive result was obtained on the submitted specimen  and confirmed on repeat testing.  While 2019 novel coronavirus  (SARS-CoV-2) nucleic acids may be present in the submitted sample  additional confirmatory testing may be necessary for epidemiological  and / or clinical management purposes  to differentiate between  SARS-CoV-2 and other Sarbecovirus currently known to infect humans.  If clinically indicated additional testing with an alternate test  methodology [phone]) is advised. The SARS-CoV-2 RNA is generally  detectable in upper and lower respiratory sp ecimens during the acute  phase of infection. The expected result is Negative. Fact Sheet for Patients:  StrictlyIdeas.no Fact Sheet for Healthcare Providers: BankingDealers.co.za This test is not yet approved or cleared by the [place] FDA and has been authorized for detection and/or diagnosis of SARS-CoV-2 by FDA under an Emergency Use Authorization (EUA).  This EUA will remain in effect (meaning this test can be used) for the duration of the COVID-19 declaration under Section 564(b)(1) of the Act, 21 U.S.C. section 360bbb-3(b)(1), unless the authorization is terminated or revoked sooner. Performed at [facility], [place] [address] [place], [place] [address]      Time coordinating discharge: 32 minutes  SIGNED:   Barb Merino, MD  [facility] 11/24/2018, 11:21 AM

## 2018-11-24 NOTE — Progress Notes (Signed)
Per night shift RN report, pt had one BM overnight. According to RN, stool was brown in color but the water around stool was noted to be bright red.

## 2018-11-24 NOTE — Telephone Encounter (Signed)
Lab order in Epic for 10/1

## 2018-11-24 NOTE — Progress Notes (Signed)
Pt discharged home today per Dr. Sloan Leiter. Pt's IV site D/C'd and WDL. Pt's VSS. Pt provided with home medication list and discharge instructions. Verbalized understanding. Pt left floor via WC in stable condition accompanied by NT.

## 2018-11-24 NOTE — Plan of Care (Signed)
  Problem: Education: Goal: Knowledge of General Education information will improve Description: Including pain rating scale, medication(s)/side effects and non-pharmacologic comfort measures Outcome: Completed/Met   Problem: Clinical Measurements: Goal: Ability to maintain clinical measurements within normal limits will improve Outcome: Progressing Goal: Will remain free from infection Outcome: Completed/Met Goal: Diagnostic test results will improve Outcome: Progressing Goal: Respiratory complications will improve Outcome: Completed/Met Goal: Cardiovascular complication will be avoided Outcome: Progressing   Problem: Activity: Goal: Risk for activity intolerance will decrease Outcome: Completed/Met   Problem: Nutrition: Goal: Adequate nutrition will be maintained Outcome: Progressing   Problem: Coping: Goal: Level of anxiety will decrease Outcome: Progressing   Problem: Elimination: Goal: Will not experience complications related to bowel motility Outcome: Progressing Goal: Will not experience complications related to urinary retention Outcome: Completed/Met   Problem: Pain Managment: Goal: General experience of comfort will improve Outcome: Completed/Met   Problem: Safety: Goal: Ability to remain free from injury will improve Outcome: Progressing   Problem: Skin Integrity: Goal: Risk for impaired skin integrity will decrease Outcome: Completed/Met   Problem: Education: Goal: Ability to identify signs and symptoms of gastrointestinal bleeding will improve Outcome: Completed/Met   Problem: Bowel/Gastric: Goal: Will show no signs and symptoms of gastrointestinal bleeding Outcome: Progressing   Problem: Fluid Volume: Goal: Will show no signs and symptoms of excessive bleeding Outcome: Progressing   Problem: Clinical Measurements: Goal: Complications related to the disease process, condition or treatment will be avoided or minimized Outcome: Progressing

## 2018-11-24 NOTE — Telephone Encounter (Signed)
-----   Message from Alfredia Ferguson, PA-C sent at 11/24/2018 10:44 AM EDT ----- Regarding: labs Please place orders for CBC on Friday, 11/25/2018, putting my name  She is being discharged today after diverticular bleed.  Reason for CBC is anemia to make it -thank you so

## 2018-11-24 NOTE — Progress Notes (Addendum)
Patient ID: Ellen [name], female   DOB: 1950-04-16, 68 y.o.   MRN: [id number]    Progress Note   Subjective   Day # 4 CC; diverticular bleed   hgb 8.5 stable  Patient had 1 stool last evening around tinged with red around the edges. Hemoglobin is been quite stable She has no complaints.     Objective   Vital signs in last 24 hours: Temp:  [98.3 F (36.8 C)-98.5 F (36.9 C)] 98.3 F (36.8 C) (09/30 0449) Pulse Rate:  [67-72] 72 (09/30 0449) Resp:  [15-18] 18 (09/30 0449) BP: (114-151)/(77-97) 114/97 (09/30 0449) SpO2:  [97 %-100 %] 97 % (09/30 0449) Last BM Date: 11/23/18 General: African-American female in NAD Heart:  Regular rate and rhythm; no murmurs Lungs: Respirations even and unlabored, lungs CTA bilaterally Abdomen:  Soft, nontender and nondistended. Normal bowel sounds. Extremities:  Without edema. Neurologic:  Alert and oriented,  grossly normal neurologically. Psych:  Cooperative. Normal mood and affect.   Lab Results: Recent Labs    11/22/18 0816 11/22/18 1406 11/23/18 0436 11/23/18 1640 11/23/18 2253  WBC 3.4* 4.8 5.2  --   --   HGB 7.5* 8.7* 7.6* 8.4* 8.5*  HCT 24.7* 29.2* 24.4* 27.0* 27.8*  PLT 236 257 235  --   --     Assessment / Plan:    #17   68 year old African-American female with acute recurrent diverticular hemorrhage. CT angio was positive on 11/20/2018 showing an area of extravasation in the proximal cecum.  Hemoglobin has been stable over the past 2 days. She did have a bowel movement last evening that was brown but with change of blood.  I think she is passing old blood from her colon.  Plan; okay for discharge from GI standpoint today Regular diet No aspirin or NSAIDs for the next 7 to 10 days and advised if she does resume aspirin to use 81 mg. We will place order for follow-up hemoglobin through our office on Friday, 11/26/2018.  Patient knows to call her office for any problems or return to the emergency room for any recurrent  active bleeding.     LOS: 4 days   Amy Esterwood PA-C 11/24/2018, 9:27 AM     [facility] Attending   I have taken an interval history, reviewed the chart and examined the patient. I agree with the Advanced Practitioner's note, impression and recommendations.    Gatha Mayer, MD, [facility] 11/24/2018 11:34 AM Pager [phone]

## 2018-11-26 ENCOUNTER — Other Ambulatory Visit (INDEPENDENT_AMBULATORY_CARE_PROVIDER_SITE_OTHER): Payer: Medicare Other

## 2018-11-26 DIAGNOSIS — Z8719 Personal history of other diseases of the digestive system: Secondary | ICD-10-CM

## 2018-11-26 LAB — CBC WITH DIFFERENTIAL/PLATELET
Basophils Absolute: 0 10*3/uL (ref 0.0–0.1)
Basophils Relative: 0.3 % (ref 0.0–3.0)
Eosinophils Absolute: 0.2 10*3/uL (ref 0.0–0.7)
Eosinophils Relative: 2.7 % (ref 0.0–5.0)
HCT: 26.6 % — ABNORMAL LOW (ref 36.0–46.0)
Hemoglobin: 8.9 g/dL — ABNORMAL LOW (ref 12.0–15.0)
Lymphocytes Relative: 33.1 % (ref 12.0–46.0)
Lymphs Abs: 2.1 10*3/uL (ref 0.7–4.0)
MCHC: 33.4 g/dL (ref 30.0–36.0)
MCV: 85.7 fl (ref 78.0–100.0)
Monocytes Absolute: 0.7 10*3/uL (ref 0.1–1.0)
Monocytes Relative: 10.4 % (ref 3.0–12.0)
Neutro Abs: 3.4 10*3/uL (ref 1.4–7.7)
Neutrophils Relative %: 53.5 % (ref 43.0–77.0)
Platelets: 349 10*3/uL (ref 150.0–400.0)
RBC: 3.1 Mil/uL — ABNORMAL LOW (ref 3.87–5.11)
RDW: 14.9 % (ref 11.5–15.5)
WBC: 6.4 10*3/uL (ref 4.0–10.5)

## 2018-11-30 ENCOUNTER — Other Ambulatory Visit: Payer: Self-pay

## 2018-11-30 DIAGNOSIS — D500 Iron deficiency anemia secondary to blood loss (chronic): Secondary | ICD-10-CM

## 2019-04-21 ENCOUNTER — Ambulatory Visit: Payer: Medicare Other | Attending: Internal Medicine

## 2019-04-21 DIAGNOSIS — Z23 Encounter for immunization: Secondary | ICD-10-CM | POA: Insufficient documentation

## 2019-04-21 NOTE — Progress Notes (Signed)
   Covid-19 Vaccination Clinic  Name:  Ellen [name]    MRN: [id number] DOB: August 29, 1950  04/21/2019  [name] was observed post Covid-19 immunization for 15 minutes without incidence. She was provided with Vaccine Information Sheet and instruction to access the V-Safe system.   [name] was instructed to call 911 with any severe reactions post vaccine: Marland Kitchen Difficulty breathing  . Swelling of your face and throat  . A fast heartbeat  . A bad rash all over your body  . Dizziness and weakness    Immunizations Administered    Name Date Dose VIS Date Route   Pfizer COVID-19 Vaccine 04/21/2019  1:25 PM 0.3 mL 02/04/2019 Intramuscular   Manufacturer: [place]   Lot: [id number]   [place]: [id number]

## 2019-05-16 ENCOUNTER — Encounter (HOSPITAL_COMMUNITY): Payer: Self-pay

## 2019-05-16 ENCOUNTER — Inpatient Hospital Stay (HOSPITAL_COMMUNITY)
Admission: EM | Admit: 2019-05-16 | Discharge: 2019-05-21 | DRG: 378 | Disposition: A | Discharge status: Home / Self Care | Payer: Medicare Other | Attending: Internal Medicine | Admitting: Internal Medicine

## 2019-05-16 ENCOUNTER — Other Ambulatory Visit: Payer: Self-pay

## 2019-05-16 DIAGNOSIS — Z8719 Personal history of other diseases of the digestive system: Secondary | ICD-10-CM

## 2019-05-16 DIAGNOSIS — Z87891 Personal history of nicotine dependence: Secondary | ICD-10-CM

## 2019-05-16 DIAGNOSIS — X58XXXA Exposure to other specified factors, initial encounter: Secondary | ICD-10-CM | POA: Diagnosis present

## 2019-05-16 DIAGNOSIS — Z20822 Contact with and (suspected) exposure to covid-19: Secondary | ICD-10-CM | POA: Diagnosis present

## 2019-05-16 DIAGNOSIS — C50412 Malignant neoplasm of upper-outer quadrant of left female breast: Secondary | ICD-10-CM | POA: Diagnosis present

## 2019-05-16 DIAGNOSIS — Z923 Personal history of irradiation: Secondary | ICD-10-CM

## 2019-05-16 DIAGNOSIS — Z800 Family history of malignant neoplasm of digestive organs: Secondary | ICD-10-CM

## 2019-05-16 DIAGNOSIS — T39395A Adverse effect of other nonsteroidal anti-inflammatory drugs [NSAID], initial encounter: Secondary | ICD-10-CM | POA: Diagnosis present

## 2019-05-16 DIAGNOSIS — D62 Acute posthemorrhagic anemia: Secondary | ICD-10-CM | POA: Diagnosis present

## 2019-05-16 DIAGNOSIS — D179 Benign lipomatous neoplasm, unspecified: Secondary | ICD-10-CM | POA: Diagnosis present

## 2019-05-16 DIAGNOSIS — K922 Gastrointestinal hemorrhage, unspecified: Secondary | ICD-10-CM | POA: Diagnosis present

## 2019-05-16 DIAGNOSIS — Z888 Allergy status to other drugs, medicaments and biological substances status: Secondary | ICD-10-CM

## 2019-05-16 DIAGNOSIS — E119 Type 2 diabetes mellitus without complications: Secondary | ICD-10-CM

## 2019-05-16 DIAGNOSIS — E785 Hyperlipidemia, unspecified: Secondary | ICD-10-CM | POA: Diagnosis present

## 2019-05-16 DIAGNOSIS — Z7984 Long term (current) use of oral hypoglycemic drugs: Secondary | ICD-10-CM

## 2019-05-16 DIAGNOSIS — Z853 Personal history of malignant neoplasm of breast: Secondary | ICD-10-CM

## 2019-05-16 DIAGNOSIS — K5731 Diverticulosis of large intestine without perforation or abscess with bleeding: Principal | ICD-10-CM | POA: Diagnosis present

## 2019-05-16 DIAGNOSIS — K625 Hemorrhage of anus and rectum: Secondary | ICD-10-CM | POA: Diagnosis not present

## 2019-05-16 DIAGNOSIS — Z79899 Other long term (current) drug therapy: Secondary | ICD-10-CM

## 2019-05-16 DIAGNOSIS — E876 Hypokalemia: Secondary | ICD-10-CM | POA: Diagnosis present

## 2019-05-16 DIAGNOSIS — Z833 Family history of diabetes mellitus: Secondary | ICD-10-CM

## 2019-05-16 DIAGNOSIS — Z6827 Body mass index (BMI) 27.0-27.9, adult: Secondary | ICD-10-CM

## 2019-05-16 DIAGNOSIS — I10 Essential (primary) hypertension: Secondary | ICD-10-CM | POA: Diagnosis present

## 2019-05-16 DIAGNOSIS — E669 Obesity, unspecified: Secondary | ICD-10-CM | POA: Diagnosis present

## 2019-05-16 DIAGNOSIS — K5909 Other constipation: Secondary | ICD-10-CM | POA: Diagnosis not present

## 2019-05-16 HISTORY — DX: Hemorrhage of anus and rectum: K62.5

## 2019-05-16 LAB — COMPREHENSIVE METABOLIC PANEL
ALT: 13 U/L (ref 0–44)
AST: 17 U/L (ref 15–41)
Albumin: 4.1 g/dL (ref 3.5–5.0)
Alkaline Phosphatase: 55 U/L (ref 38–126)
Anion gap: 11 (ref 5–15)
BUN: 33 mg/dL — ABNORMAL HIGH (ref 8–23)
CO2: 26 mmol/L (ref 22–32)
Calcium: 9.3 mg/dL (ref 8.9–10.3)
Chloride: 103 mmol/L (ref 98–111)
Creatinine, Ser: 1.05 mg/dL — ABNORMAL HIGH (ref 0.44–1.00)
GFR calc Af Amer: >60 mL/min (ref >60)
GFR calc non Af Amer: 55 mL/min — ABNORMAL LOW (ref >60)
Glucose, Bld: 127 mg/dL — ABNORMAL HIGH (ref 70–99)
Potassium: 3.7 mmol/L (ref 3.5–5.1)
Sodium: 140 mmol/L (ref 135–145)
Total Bilirubin: 0.5 mg/dL (ref 0.3–1.2)
Total Protein: 7.3 g/dL (ref 6.5–8.1)

## 2019-05-16 LAB — CBC
HCT: 34 % — ABNORMAL LOW (ref 36.0–46.0)
Hemoglobin: 10.5 g/dL — ABNORMAL LOW (ref 12.0–15.0)
MCH: 25.8 pg — ABNORMAL LOW (ref 26.0–34.0)
MCHC: 30.9 g/dL (ref 30.0–36.0)
MCV: 83.5 fL (ref 80.0–100.0)
Platelets: 378 10*3/uL (ref 150–400)
RBC: 4.07 MIL/uL (ref 3.87–5.11)
RDW: 19.3 % — ABNORMAL HIGH (ref 11.5–15.5)
WBC: 9.7 10*3/uL (ref 4.0–10.5)
nRBC: 0 % (ref 0.0–0.2)

## 2019-05-16 LAB — POC OCCULT BLOOD, ED: Fecal Occult Bld: POSITIVE — AB

## 2019-05-16 MED ORDER — PANTOPRAZOLE SODIUM 40 MG IV SOLR
40.0000 mg | Freq: Two times a day (BID) | INTRAVENOUS | Status: DC
  Administered 2019-05-20 (×2): 40 mg via INTRAVENOUS
  Filled 2019-05-16 (×2): qty 40

## 2019-05-16 MED ORDER — SODIUM CHLORIDE 0.9 % IV SOLN
8.0000 mg/h | INTRAVENOUS | Status: AC
  Administered 2019-05-16 – 2019-05-19 (×8): 8 mg/h via INTRAVENOUS
  Filled 2019-05-16 (×14): qty 80

## 2019-05-16 MED ORDER — SODIUM CHLORIDE 0.9 % IV SOLN
80.0000 mg | Freq: Once | INTRAVENOUS | Status: AC
  Administered 2019-05-16: 80 mg via INTRAVENOUS
  Filled 2019-05-16: qty 80

## 2019-05-16 NOTE — ED Notes (Signed)
Restriction on left arm and an extremity restriction band has been place on that arm

## 2019-05-16 NOTE — ED Provider Notes (Signed)
[facility] DEPT Provider Note   CSN: [id number] Arrival date & time: 05/16/19  2026     History Chief Complaint  Patient presents with  . Blood In [place] is a 69 y.o. female.  The history is provided by the patient and medical records.    69 year old female with history of breast cancer, diabetes, glaucoma, hypertension, hyperlipidemia, rectal bleeding, diverticulosis, presenting to the ED with GI bleeding.  States this began this evening while she was at work.  States she went to the bathroom at work and had a solid bowel movement but did notice bright red blood mixed in with her stool.  She went again shortly after this and it was all blood, remained bright red in color without any noted mixed clots.  She has not had any abdominal pain, nausea, or vomiting.  No fever or chills.  She reports history of GI bleeding similar to this in the past.  She had a colonoscopy last year which showed diverticulosis.  She is followed by Dr. Loletha Carrow with  GI.  She does take daily 81 mg aspirin and reports she has been taking Aleve recently due to bursitis in her hip.  Past Medical History:  Diagnosis Date  . Breast CA ([place]) 10/2007   S/P Surgery and Radiation Tx completed 03/20/08  . Diabetes ([place])   . Glaucoma   . HTN (hypertension)   . Hyperlipidemia   . Personal history of radiation therapy 2009  . Rectal bleeding   . Thyroid goiter 03/2010   FNA of Left Thyroid Nodule - 05/14/2010    Patient Active Problem List   Diagnosis Date Noted  . Diverticulosis of colon with hemorrhage   . Acute blood loss anemia   . Rectal bleed 11/20/2018  . Hypokalemia 11/20/2018  . Hemarthrosis of left knee 11/06/2017  . Left low back pain 07/14/2017  . GI bleed 05/09/2017  . Malignant neoplasm of overlapping sites of left female breast ([place]) 06/29/2015  . Dry eye syndrome 06/01/2014  . Eye disease 06/01/2014  . Goiter 04/28/2013  . History of tobacco  use 11/05/2011  . Breast cancer of upper-outer quadrant of left female breast ([place]) 11/04/2011  . Obesity (BMI 30-39.9) 04/03/2011  . HTN (hypertension)   . COLONIC POLYPS, ADENOMATOUS, HX OF 05/02/2008  . Hyperlipidemia 12/11/2003    Class: Chronic    Past Surgical History:  Procedure Laterality Date  . ABDOMINAL HYSTERECTOMY  1986   Heavy bleeding  . BREAST BIOPSY  12/01/2007   Left axillary sentinal node bx : positive  . BREAST SURGERY  11/08/2007   Left partial mastectomy (lumpectomy)  . COLONOSCOPY  2010     OB History   No obstetric history on file.     Family History  Problem Relation Age of Onset  . Diabetes Father   . Liver cancer Father        Died at 75   . Other Brother        gunshot wound  . Colon cancer Neg Hx   . Rectal cancer Neg Hx   . Stomach cancer Neg Hx   . Esophageal cancer Neg Hx     Social History   Tobacco Use  . Smoking status: Former Smoker    Types: Cigarettes    Quit date: 02/25/2007    Years since quitting: 12.2  . Smokeless tobacco: Never Used  Substance Use Topics  . Alcohol use: No  . Drug use:  No    Home Medications Prior to Admission medications   Medication Sig Start Date End Date Taking? Authorizing Provider  amLODipine (NORVASC) 10 MG tablet Take 1 tablet (10 mg total) by mouth daily. 03/22/16   Horald Pollen, MD  calcium-vitamin D (OSCAL WITH D) 500-200 MG-UNIT per tablet Take 1 tablet by mouth 2 (two) times daily.    [provider]  cholecalciferol (VITAMIN D3) 25 MCG (1000 UT) tablet Take 2,000 Units by mouth daily.    [provider]  hydroxypropyl methylcellulose / hypromellose (ISOPTO TEARS / GONIOVISC) 2.5 % ophthalmic solution Place 1 drop into both eyes 3 (three) times daily as needed for dry eyes.    [provider]  metFORMIN (GLUCOPHAGE) 500 MG tablet Take 500 mg by mouth 2 (two) times daily. 03/24/17   [provider]  pravastatin (PRAVACHOL) 40 MG tablet Take 1  tablet (40 mg total) by mouth daily. 12/13/15   Elby Beck, FNP    Allergies    Ace inhibitors and Simvastatin  Review of Systems   Review of Systems  Gastrointestinal: Positive for blood in stool.  All other systems reviewed and are negative.   Physical Exam Updated Vital Signs BP (!) 133/108 (BP Location: Right Arm)   Pulse (!) 101   Temp 98.1 F (36.7 C) (Oral)   Resp 18   Ht 5\' 3"  (1.6 m)   Wt 68.9 kg   SpO2 97%   BMI 26.93 kg/m   Physical Exam Vitals and nursing note reviewed.  Constitutional:      Appearance: She is well-developed.  HENT:     Head: Normocephalic and atraumatic.  Eyes:     Conjunctiva/sclera: Conjunctivae normal.     Pupils: Pupils are equal, round, and reactive to light.  Cardiovascular:     Rate and Rhythm: Normal rate and regular rhythm.     Heart sounds: Normal heart sounds.  Pulmonary:     Effort: Pulmonary effort is normal.     Breath sounds: Normal breath sounds.  Abdominal:     General: Bowel sounds are normal.     Palpations: Abdomen is soft.  Genitourinary:    Comments: Exam  Chaperoned by RN Bright red blood present surrounding rectum and on DRE Musculoskeletal:        General: Normal range of motion.     Cervical back: Normal range of motion.  Skin:    General: Skin is warm and dry.  Neurological:     Mental Status: She is alert and oriented to person, place, and time.     ED Results / Procedures / Treatments   Labs (all labs ordered are listed, but only abnormal results are displayed) Labs Reviewed  COMPREHENSIVE METABOLIC PANEL - Abnormal; Notable for the following components:      Result Value   Glucose, Bld 127 (*)    BUN 33 (*)    Creatinine, Ser 1.05 (*)    GFR calc non Af Amer 55 (*)    All other components within normal limits  CBC - Abnormal; Notable for the following components:   Hemoglobin 10.5 (*)    HCT 34.0 (*)    MCH 25.8 (*)    RDW 19.3 (*)    All other components within normal limits    POC OCCULT BLOOD, ED - Abnormal; Notable for the following components:   Fecal Occult Bld POSITIVE (*)    All other components within normal limits  SARS CORONAVIRUS 2 (TAT 6-24 HRS)  TYPE AND SCREEN    EKG None  Radiology No results found.  Procedures Procedures (including critical care time)  Medications Ordered in ED Medications - No data to display  ED Course  I have reviewed the triage vital signs and the nursing notes.  Pertinent labs & imaging results that were available during my care of the patient were reviewed by me and considered in my medical decision making (see chart for details).    MDM Rules/Calculators/A&P  69 year old female presenting to the ED with GI bleeding that began this evening.  Reports history of same.  States bleeding has been bright red in color without any noted clots intermixed.  Here she is afebrile nontoxic in appearance.  She is hemodynamically stable.  Her abdomen is soft and benign.  Rectal exam was performed, does have bright red blood surrounding rectum and on digital rectal exam.  Suspect this is likely diverticular.  She takes daily 81 mg aspirin, however has been taking Aleve recently due to bursitis of her hip which is likely contributing.  Labs today are overall reassuring, hemoglobin is stable at 10.5.  Will start on Protonix drip and plan to admit for close observation.  As she is stable and does not appear to need emergent intervention currently, I feel GI can be consulted in the AM.  Discussed with hospitalist, Dr. Jonelle Sidle-- will evaluate in the ED and admit.  Final Clinical Impression(s) / ED Diagnoses Final diagnoses:  Gastrointestinal hemorrhage, unspecified gastrointestinal hemorrhage type    Rx / DC Orders ED Discharge Orders    None       Larene [facility], PA-C 05/16/19 2303    Carmin Muskrat, MD 05/17/19 [phone]

## 2019-05-16 NOTE — H&P (Signed)
History and Physical   Ellen [name] [id number] DOB: 1950/11/29 DOA: 05/16/2019  Referring MD/NP/PA: Dr. Vanita Panda  PCP: Nolene Ebbs, MD   Outpatient Specialists: [facility] gastroenterology  Patient coming from: Home  Chief Complaint: Rectal  HPI: Ellen [name] is a 69 y.o. female with medical history significant of diverticular disease with previous GI bleed, history of breast cancer status post surgery and radiation, diabetes, hypertension, hyperlipidemia, morbid obesity, glaucoma who presented to the ER with rectal bleed.  Patient has been having bursitis lately and has been taking Aleve.  She has also been on aspirin 81 mg daily.  She noticed a progressive bleeding this evening around 815.  No pain.  She has had bleeding that is porous.  She was slightly weak and dizzy but currently stable.  Vitals all appear to be stable at the moment.  Stool guaiac positive.  She is being admitted with suspected diverticular bleed due to the painless nature.  Probably getting worse due to NSAID use.  Patient denied any fever.  No nausea or vomiting.  She denied any hematemesis.  She has been following up with a GI for previous bleeds that was found to be diverticular in nature..  ED Course: Temperature 98.1 blood pressure 133/108 pulse 101 respirate of 18 oxygen sat 97% room air.  White count 9.7 hemoglobin 10.5 and platelets 178.  BUN is 33 creatinine 1.05 glucose 127 the rest of the chemistry and CBC within normal.  Stool guaiac is positive.  She has been admitted with GI bleed after initiation of Protonix IV.  Review of Systems: As per HPI otherwise 10 point review of systems negative.    Past Medical History:  Diagnosis Date  . Breast CA ([place]) 10/2007   S/P Surgery and Radiation Tx completed 03/20/08  . Diabetes ([place])   . Glaucoma   . HTN (hypertension)   . Hyperlipidemia   . Personal history of radiation therapy 2009  . Rectal bleeding   . Thyroid goiter 03/2010   FNA of Left Thyroid Nodule  - 05/14/2010    Past Surgical History:  Procedure Laterality Date  . ABDOMINAL HYSTERECTOMY  1986   Heavy bleeding  . BREAST BIOPSY  12/01/2007   Left axillary sentinal node bx : positive  . BREAST SURGERY  11/08/2007   Left partial mastectomy (lumpectomy)  . COLONOSCOPY  2010     reports that she quit smoking about 12 years ago. Her smoking use included cigarettes. She has never used smokeless tobacco. She reports that she does not drink alcohol or use drugs.  Allergies  Allergen Reactions  . Ace Inhibitors Swelling  . Simvastatin Swelling    lips    Family History  Problem Relation Age of Onset  . Diabetes Father   . Liver cancer Father        Died at 83   . Other Brother        gunshot wound  . Colon cancer Neg Hx   . Rectal cancer Neg Hx   . Stomach cancer Neg Hx   . Esophageal cancer Neg Hx      Prior to Admission medications   Medication Sig Start Date End Date Taking? Authorizing Provider  amLODipine (NORVASC) 10 MG tablet Take 1 tablet (10 mg total) by mouth daily. 03/22/16   Horald Pollen, MD  calcium-vitamin D (OSCAL WITH D) 500-200 MG-UNIT per tablet Take 1 tablet by mouth 2 (two) times daily.    [provider]  cholecalciferol (VITAMIN D3)  25 MCG (1000 UT) tablet Take 2,000 Units by mouth daily.    [provider]  hydroxypropyl methylcellulose / hypromellose (ISOPTO TEARS / GONIOVISC) 2.5 % ophthalmic solution Place 1 drop into both eyes 3 (three) times daily as needed for dry eyes.    [provider]  metFORMIN (GLUCOPHAGE) 500 MG tablet Take 500 mg by mouth 2 (two) times daily. 03/24/17   [provider]  pravastatin (PRAVACHOL) 40 MG tablet Take 1 tablet (40 mg total) by mouth daily. 12/13/15   Elby Beck, FNP    Physical Exam: Vitals:   05/16/19 2038 05/16/19 2100 05/16/19 2232  BP: (!) 147/100  (!) 133/108  Pulse: 98  (!) 101  Resp: 18  18  Temp: 98.1 F (36.7 C)    TempSrc: Oral    SpO2: 100%   97%  Weight:  68.9 kg   Height:  5\' 3"  (1.6 m)       Constitutional: Morbidly obese, anxious, no acute distress Vitals:   05/16/19 2038 05/16/19 2100 05/16/19 2232  BP: (!) 147/100  (!) 133/108  Pulse: 98  (!) 101  Resp: 18  18  Temp: 98.1 F (36.7 C)    TempSrc: Oral    SpO2: 100%  97%  Weight:  68.9 kg   Height:  5\' 3"  (1.6 m)    Eyes: PERRL, lids and conjunctivae normal ENMT: Mucous membranes are moist. Posterior pharynx clear of any exudate or lesions.Normal dentition.  Neck: normal, supple, no masses, no thyromegaly Respiratory: clear to auscultation bilaterally, no wheezing, no crackles. Normal respiratory effort. No accessory muscle use.  Cardiovascular: Regular rate and rhythm, no murmurs / rubs / gallops. No extremity edema. 2+ pedal pulses. No carotid bruits.  Abdomen: no tenderness, no masses palpated. No hepatosplenomegaly. Bowel sounds positive.  Musculoskeletal: no clubbing / cyanosis. No joint deformity upper and lower extremities. Good ROM, no contractures. Normal muscle tone.  Skin: no rashes, lesions, ulcers. No induration Neurologic: CN 2-12 grossly intact. Sensation intact, DTR normal. Strength 5/5 in all 4.  Psychiatric: Normal judgment and insight. Alert and oriented x 3.  Anxious mood mood.     Labs on Admission: I have personally reviewed following labs and imaging studies  CBC: Recent Labs  Lab 05/16/19 2056  WBC 9.7  HGB 10.5*  HCT 34.0*  MCV 83.5  PLT [id number]   Basic Metabolic Panel: Recent Labs  Lab 05/16/19 2056  NA 140  K 3.7  CL 103  CO2 26  GLUCOSE 127*  BUN 33*  CREATININE 1.05*  CALCIUM 9.3   GFR: Estimated Creatinine Clearance: 48.4 mL/min (A) (by C-G formula based on SCr of 1.05 mg/dL (H)). Liver Function Tests: Recent Labs  Lab 05/16/19 2056  AST 17  ALT 13  ALKPHOS 55  BILITOT 0.5  PROT 7.3  ALBUMIN 4.1   No results for input(s): LIPASE, AMYLASE in the last 168 hours. No results for input(s): AMMONIA in the  last 168 hours. Coagulation Profile: No results for input(s): INR, PROTIME in the last 168 hours. Cardiac Enzymes: No results for input(s): CKTOTAL, CKMB, CKMBINDEX, TROPONINI in the last 168 hours. BNP (last 3 results) No results for input(s): PROBNP in the last 8760 hours. HbA1C: No results for input(s): HGBA1C in the last 72 hours. CBG: No results for input(s): GLUCAP in the last 168 hours. Lipid Profile: No results for input(s): CHOL, HDL, LDLCALC, TRIG, CHOLHDL, LDLDIRECT in the last 72 hours. Thyroid Function Tests: No results for input(s): TSH, T4TOTAL,  FREET4, T3FREE, THYROIDAB in the last 72 hours. Anemia Panel: No results for input(s): VITAMINB12, FOLATE, FERRITIN, TIBC, IRON, RETICCTPCT in the last 72 hours. Urine analysis:    Component Value Date/Time   COLORURINE STRAW (A) 04/22/2017 1938   APPEARANCEUR CLEAR 04/22/2017 1938   LABSPEC 1.010 05/01/2017 1705   PHURINE 7.5 05/01/2017 1705   GLUCOSEU NEGATIVE 05/01/2017 1705   HGBUR TRACE (A) 05/01/2017 1705   BILIRUBINUR NEGATIVE 05/01/2017 1705   BILIRUBINUR Negative 05/30/2014 1508   KETONESUR NEGATIVE 05/01/2017 1705   PROTEINUR NEGATIVE 05/01/2017 1705   UROBILINOGEN 0.2 05/01/2017 1705   NITRITE NEGATIVE 05/01/2017 1705   LEUKOCYTESUR NEGATIVE 05/01/2017 1705   Sepsis Labs: @LABRCNTIP (procalcitonin:4,lacticidven:4) )No results found for this or any previous visit (from the past 240 hour(s)).   Radiological Exams on Admission: No results found.    Assessment/Plan Principal Problem:   Rectal bleed Active Problems:   HTN (hypertension)   Hyperlipidemia   Obesity (BMI 30-39.9)   Breast cancer of upper-outer quadrant of left female breast ([place])   GI bleed   Diabetes ([place])     #1 rectal bleed: Most likely diverticular bleed.  Is painless.  She has history of diverticulosis.  Also has been taking NSAIDs.  We will admit the patient.  Keep n.p.o.  IV Protonix.  GI consult in the morning.  Serial CBCs and  IV fluids.  If hemoglobin drops further we will transfuse patient.  #2 hypertension: Continue with home regimen.  She will be n.p.o. however at the moment.  IV labetalol if blood pressure starts rising.  #3 hyperlipidemia: Holding statins.  #4 diabetes: Sliding scale insulin.  #5 morbid obesity: Dietary counseling.   DVT prophylaxis: SCD Code Status: Full code Family Communication: No family at bedside Disposition Plan: Home Consults called: None tonight.  [facility] in the morning Admission status: Observation  Severity of Illness: The appropriate patient status for this patient is OBSERVATION. Observation status is judged to be reasonable and necessary in order to provide the required intensity of service to ensure the patient's safety. The patient's presenting symptoms, physical exam findings, and initial radiographic and laboratory data in the context of their medical condition is felt to place them at decreased risk for further clinical deterioration. Furthermore, it is anticipated that the patient will be medically stable for discharge from the hospital within 2 midnights of admission. The following factors support the patient status of observation.   " The patient's presenting symptoms include rectal bleed. " The physical exam findings include no abdominal tenderness. " The initial radiographic and laboratory data are hemoglobin obtained and wiped positive x2.     Barbette Merino MD [facility] Pager [phone]  If 7PM-7AM, please contact night-coverage [email or web address] Password [facility]  05/16/2019, 11:04 PM

## 2019-05-16 NOTE — ED Triage Notes (Signed)
Arrived POV patient reports bright red rectal bleeding that started around 2015 this evening. Patient denies any pain associated with the bleeding. Patient reports the blood pours out. Patient says she is not having any other symptoms at this time, but the last time this happened she did become weak and dizzy.

## 2019-05-17 ENCOUNTER — Inpatient Hospital Stay (HOSPITAL_COMMUNITY): Payer: Medicare Other

## 2019-05-17 ENCOUNTER — Ambulatory Visit: Payer: Medicare Other

## 2019-05-17 ENCOUNTER — Other Ambulatory Visit: Payer: Self-pay

## 2019-05-17 DIAGNOSIS — K922 Gastrointestinal hemorrhage, unspecified: Secondary | ICD-10-CM | POA: Diagnosis present

## 2019-05-17 DIAGNOSIS — K5909 Other constipation: Secondary | ICD-10-CM | POA: Diagnosis not present

## 2019-05-17 DIAGNOSIS — K5731 Diverticulosis of large intestine without perforation or abscess with bleeding: Secondary | ICD-10-CM | POA: Diagnosis present

## 2019-05-17 DIAGNOSIS — Z8719 Personal history of other diseases of the digestive system: Secondary | ICD-10-CM | POA: Diagnosis not present

## 2019-05-17 DIAGNOSIS — Z6827 Body mass index (BMI) 27.0-27.9, adult: Secondary | ICD-10-CM | POA: Diagnosis not present

## 2019-05-17 DIAGNOSIS — Z79899 Other long term (current) drug therapy: Secondary | ICD-10-CM | POA: Diagnosis not present

## 2019-05-17 DIAGNOSIS — D62 Acute posthemorrhagic anemia: Secondary | ICD-10-CM | POA: Diagnosis present

## 2019-05-17 DIAGNOSIS — Z888 Allergy status to other drugs, medicaments and biological substances status: Secondary | ICD-10-CM | POA: Diagnosis not present

## 2019-05-17 DIAGNOSIS — T39395A Adverse effect of other nonsteroidal anti-inflammatory drugs [NSAID], initial encounter: Secondary | ICD-10-CM | POA: Diagnosis present

## 2019-05-17 DIAGNOSIS — E876 Hypokalemia: Secondary | ICD-10-CM | POA: Diagnosis present

## 2019-05-17 DIAGNOSIS — Z7984 Long term (current) use of oral hypoglycemic drugs: Secondary | ICD-10-CM | POA: Diagnosis not present

## 2019-05-17 DIAGNOSIS — Z87891 Personal history of nicotine dependence: Secondary | ICD-10-CM | POA: Diagnosis not present

## 2019-05-17 DIAGNOSIS — K625 Hemorrhage of anus and rectum: Secondary | ICD-10-CM

## 2019-05-17 DIAGNOSIS — E785 Hyperlipidemia, unspecified: Secondary | ICD-10-CM | POA: Diagnosis present

## 2019-05-17 DIAGNOSIS — X58XXXA Exposure to other specified factors, initial encounter: Secondary | ICD-10-CM | POA: Diagnosis present

## 2019-05-17 DIAGNOSIS — D179 Benign lipomatous neoplasm, unspecified: Secondary | ICD-10-CM | POA: Diagnosis present

## 2019-05-17 DIAGNOSIS — E119 Type 2 diabetes mellitus without complications: Secondary | ICD-10-CM | POA: Diagnosis present

## 2019-05-17 DIAGNOSIS — Z833 Family history of diabetes mellitus: Secondary | ICD-10-CM | POA: Diagnosis not present

## 2019-05-17 DIAGNOSIS — Z923 Personal history of irradiation: Secondary | ICD-10-CM | POA: Diagnosis not present

## 2019-05-17 DIAGNOSIS — Z853 Personal history of malignant neoplasm of breast: Secondary | ICD-10-CM | POA: Diagnosis not present

## 2019-05-17 DIAGNOSIS — Z20822 Contact with and (suspected) exposure to covid-19: Secondary | ICD-10-CM | POA: Diagnosis present

## 2019-05-17 DIAGNOSIS — Z800 Family history of malignant neoplasm of digestive organs: Secondary | ICD-10-CM | POA: Diagnosis not present

## 2019-05-17 DIAGNOSIS — I10 Essential (primary) hypertension: Secondary | ICD-10-CM | POA: Diagnosis present

## 2019-05-17 LAB — CBC
HCT: 28.8 % — ABNORMAL LOW (ref 36.0–46.0)
HCT: 24.8 % — ABNORMAL LOW (ref 36.0–46.0)
Hemoglobin: 8.9 g/dL — ABNORMAL LOW (ref 12.0–15.0)
Hemoglobin: 7.7 g/dL — ABNORMAL LOW (ref 12.0–15.0)
MCH: 25.7 pg — ABNORMAL LOW (ref 26.0–34.0)
MCH: 26 pg (ref 26.0–34.0)
MCHC: 30.9 g/dL (ref 30.0–36.0)
MCHC: 31 g/dL (ref 30.0–36.0)
MCV: 83.2 fL (ref 80.0–100.0)
MCV: 83.8 fL (ref 80.0–100.0)
Platelets: 307 10*3/uL (ref 150–400)
Platelets: 259 10*3/uL (ref 150–400)
RBC: 3.46 MIL/uL — ABNORMAL LOW (ref 3.87–5.11)
RBC: 2.96 MIL/uL — ABNORMAL LOW (ref 3.87–5.11)
RDW: 19.4 % — ABNORMAL HIGH (ref 11.5–15.5)
RDW: 19.5 % — ABNORMAL HIGH (ref 11.5–15.5)
WBC: 10 10*3/uL (ref 4.0–10.5)
WBC: 8 10*3/uL (ref 4.0–10.5)
nRBC: 0 % (ref 0.0–0.2)
nRBC: 0 % (ref 0.0–0.2)

## 2019-05-17 LAB — COMPREHENSIVE METABOLIC PANEL
ALT: 11 U/L (ref 0–44)
AST: 14 U/L — ABNORMAL LOW (ref 15–41)
Albumin: 3.6 g/dL (ref 3.5–5.0)
Alkaline Phosphatase: 43 U/L (ref 38–126)
Anion gap: 13 (ref 5–15)
BUN: 41 mg/dL — ABNORMAL HIGH (ref 8–23)
CO2: 20 mmol/L — ABNORMAL LOW (ref 22–32)
Calcium: 8.6 mg/dL — ABNORMAL LOW (ref 8.9–10.3)
Chloride: 108 mmol/L (ref 98–111)
Creatinine, Ser: 0.84 mg/dL (ref 0.44–1.00)
GFR calc Af Amer: >60 mL/min (ref >60)
GFR calc non Af Amer: >60 mL/min (ref >60)
Glucose, Bld: 106 mg/dL — ABNORMAL HIGH (ref 70–99)
Potassium: 4.3 mmol/L (ref 3.5–5.1)
Sodium: 141 mmol/L (ref 135–145)
Total Bilirubin: 0.4 mg/dL (ref 0.3–1.2)
Total Protein: 6.2 g/dL — ABNORMAL LOW (ref 6.5–8.1)

## 2019-05-17 LAB — HEMOGLOBIN AND HEMATOCRIT, BLOOD
HCT: 17.7 % — ABNORMAL LOW (ref 36.0–46.0)
HCT: 36.7 % (ref 36.0–46.0)
Hemoglobin: 5.2 g/dL — CL (ref 12.0–15.0)
Hemoglobin: 12.1 g/dL (ref 12.0–15.0)

## 2019-05-17 LAB — GLUCOSE, CAPILLARY
Glucose-Capillary: 99 mg/dL (ref 70–99)
Glucose-Capillary: 77 mg/dL (ref 70–99)
Glucose-Capillary: 102 mg/dL — ABNORMAL HIGH (ref 70–99)
Glucose-Capillary: 147 mg/dL — ABNORMAL HIGH (ref 70–99)

## 2019-05-17 LAB — SARS CORONAVIRUS 2 (TAT 6-24 HRS): SARS Coronavirus 2: NEGATIVE

## 2019-05-17 LAB — HEMOGLOBIN A1C
Hgb A1c MFr Bld: 6 % — ABNORMAL HIGH (ref 4.8–5.6)
Mean Plasma Glucose: 125.5 mg/dL

## 2019-05-17 LAB — PREPARE RBC (CROSSMATCH)

## 2019-05-17 MED ORDER — PANTOPRAZOLE SODIUM 40 MG IV SOLR: 40.0000 mg | Freq: Two times a day (BID) | INTRAVENOUS | Status: DC

## 2019-05-17 MED ORDER — ONDANSETRON HCL 4 MG/2ML IJ SOLN: 4.0000 mg | Freq: Four times a day (QID) | INTRAMUSCULAR | Status: DC | PRN

## 2019-05-17 MED ORDER — IOHEXOL 350 MG/ML SOLN
100.0000 mL | Freq: Once | INTRAVENOUS | Status: AC | PRN
  Administered 2019-05-17: 100 mL via INTRAVENOUS

## 2019-05-17 MED ORDER — DEXTROSE IN LACTATED RINGERS 5 % IV SOLN: INTRAVENOUS | Status: DC

## 2019-05-17 MED ORDER — ONDANSETRON HCL 4 MG PO TABS: 4.0000 mg | ORAL_TABLET | Freq: Four times a day (QID) | ORAL | Status: DC | PRN

## 2019-05-17 MED ORDER — INSULIN ASPART 100 UNIT/ML ~~LOC~~ SOLN
0.0000 [IU] | Freq: Three times a day (TID) | SUBCUTANEOUS | Status: DC
  Administered 2019-05-18 – 2019-05-21 (×2): 1 [IU] via SUBCUTANEOUS

## 2019-05-17 MED ORDER — SODIUM CHLORIDE 0.9% IV SOLUTION: Freq: Once | INTRAVENOUS | Status: AC

## 2019-05-17 MED ORDER — SODIUM CHLORIDE 0.9 % IV SOLN: INTRAVENOUS | Status: DC

## 2019-05-17 NOTE — Plan of Care (Signed)

## 2019-05-17 NOTE — ED Notes (Signed)
Pt called out for restroom assistance.  Upon entering the room, one of pt's IVs had come out and there was minor bleeding. RN notified. Pt was placed on bedpan and hand was cleaned from IV site bleeding. Bleeding stopped.  Pt has call light within reach.

## 2019-05-17 NOTE — Consult Note (Addendum)
Referring Provider: Dr. Irene Pap  Primary Care Physician:  Nolene Ebbs, MD Primary Gastroenterologist:  Dr. Ardis Hughs  Reason for Consultation: Painless hematochezia   HPI: Ellen [name] is a 69 y.o. female with a past medical history of hypertension, diabetes mellitus type 2, hyperlipidemia, breast cancer status post surgery 2009 and radiation 2010 and diverticular bleed 11/20/2018.  She presented to [facility] ED on 05/16/2019 with complaints of bright red rectal bleeding which occurred around 8 PM.  The patient reported to me (Dr. Henrene Pastor) multiple episodes of large-volume hematochezia.  No bowel movement today.  No associated abdominal or rectal pain.  No chest pain, shortness of breath or dizziness. No fever. She was concerned she was having another diverticular bleed. She takes Mirlax daily to prevent constipation. She takes Aleve 281m 2 tabs daily for bursitis pain and ASA 843monce daily. She was admitted to the hospital 11/20/2018 with a diverticular bleed. Admission Hg 11.9 dropped to 7.7. An abdominal pelvic CT angiogram showed area of extravasation in the proximal cecum. Her Hg stabilized without receiving a blood transfusion. Her most recent colonoscopy was 07/2018 which showed diverticulosis.    ED course: Sodium 140.  Potassium 3.7.  Glucose 127.  BUN 33.  Creatinine 1.05.  Alk phos 55.  Albumin 4.1.  AST 17.  ALT 13.  Total bili 0.5.  WBC 9.7.  Hemoglobin 10.5. ( Hg 8.9 on 1/0/03/2018).  Hematocrit 34.0.  Platelet 378.   GI History:  Abdominal/pelvic CT angiogram 11/20/2018: Stomach/Bowel: Distal esophagus, stomach and duodenal sweep are unremarkable. No bowel wall thickening or dilatation. No evidence of obstruction. A normal appendix is visualized. There are right and left-sided colonic diverticula. There is a blush of high-density contrast material within the cecum seen on arterial phase with expansion by the portal venous phase concerning for active  contrast extravasation, possibly diverticular bleeding. Colon is fluid-filled. No colonic dilatation or wall thickening.  VASCULARIMPRESSION: Focus of active contrast extravasation the proximal cecum. Patient does demonstrate right-sided colonic diverticula with minimal adjacent inflammation, suggesting possible diverticular etiology for this lower GI bleed.  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  NON-VASCULAR  Circumscribed fat attenuation lesion subjacent to the right latissimus dorsi. 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 If pain or symptoms develop, consider reimaging.  08/06/2018 colonoscopy Dr. JaArdis HughsDiverticulosis in the left colon otherwise normal, repeat recommended in 5 yrs  Colonoscopy May 2015 per Dr. JaArdis Hughs 2 small polyps biopsies showed benign colonic mucosa no adenomatous change. Recall colonoscopy 5 years.   Colonoscopy Sept. 2007 by Dr. JaArdis Hughs 3 tubular adenomas removed September 2007 at hepatic flexure   Past Medical History:  Diagnosis Date  . [place]   S/P Surgery and Radiation Tx completed 03/20/08  . [place] ([place]  . Glaucoma   . HTN (hypertension)   . Hyperlipidemia   . Personal history of radiation therapy 2009  . Rectal bleeding   . Thyroid goiter 03/2010   FNA of Left Thyroid Nodule - 05/14/2010    Past Surgical History:  Procedure Laterality Date  . ABDOMINAL HYSTERECTOMY  1986   Heavy bleeding  . BREAST BIOPSY  12/01/2007   Left axillary sentinal node bx : positive  . BREAST SURGERY  11/08/2007   Left partial mastectomy (lumpectomy)  . COLONOSCOPY  2010    Prior to Admission medications   Medication Sig Start Date  End Date Taking? Authorizing Provider  amLODipine (NORVASC) 10 MG tablet Take 1 tablet (10 mg total) by mouth daily. 03/22/16  Yes Sagardia, Ines Bloomer, MD   calcium-vitamin D (OSCAL WITH D) 500-200 MG-UNIT per tablet Take 1 tablet by mouth 2 (two) times daily.   Yes [provider]  cholecalciferol (VITAMIN D3) 25 MCG (1000 UT) tablet Take 2,000 Units by mouth daily.   Yes [provider]  hydroxypropyl methylcellulose / hypromellose (ISOPTO TEARS / GONIOVISC) 2.5 % ophthalmic solution Place 1 drop into both eyes 3 (three) times daily as needed for dry eyes.   Yes [provider]  latanoprost (XALATAN) 0.005 % ophthalmic solution Place 1 drop into both eyes at bedtime. 04/06/19  Yes [provider]  LUMIGAN 0.01 % SOLN Place 1 drop into both eyes at bedtime. 04/06/19  Yes [provider]  metFORMIN (GLUCOPHAGE) 500 MG tablet Take 500 mg by mouth 2 (two) times daily. 03/24/17  Yes [provider]  pravastatin (PRAVACHOL) 40 MG tablet Take 1 tablet (40 mg total) by mouth daily. 12/13/15  Yes Elby Beck, FNP    Current Facility-Administered Medications  Medication Dose Route Frequency Provider Last Rate Last Admin  . 0.9 %  sodium chloride infusion   Intravenous Continuous Elwyn Reach, MD 100 mL/hr at 05/17/19 0240 New Bag at 05/17/19 0240  . insulin aspart (novoLOG) injection 0-9 Units  0-9 Units Subcutaneous TID WC Garba, Mohammad L, MD      . ondansetron (ZOFRAN) tablet 4 mg  4 mg Oral Q6H PRN Elwyn Reach, MD       Or  . ondansetron (ZOFRAN) injection 4 mg  4 mg Intravenous Q6H PRN Gala Romney L, MD      . pantoprazole (PROTONIX) 80 mg in sodium chloride 0.9 % 100 mL (0.8 mg/mL) infusion  8 mg/hr Intravenous Continuous Larene Pickett, PA-C 10 mL/hr at 05/17/19 0807 8 mg/hr at 05/17/19 0807  . [START ON 05/20/2019] pantoprazole (PROTONIX) injection 40 mg  40 mg Intravenous Q12H Larene Pickett, PA-C        Allergies as of 05/16/2019 - Review Complete 05/16/2019  Allergen Reaction Noted  . Ace inhibitors Swelling 04/18/2010  . Simvastatin Swelling 04/03/2011    Family  History  Problem Relation Age of Onset  . Diabetes Father   . Liver cancer Father        Died at 54   . Other Brother        gunshot wound  . Colon cancer Neg Hx   . Rectal cancer Neg Hx   . Stomach cancer Neg Hx   . Esophageal cancer Neg Hx     Social History   Socioeconomic History  . Marital status: Divorced    Spouse name: Not on file  . Number of children: 3  . Years of education: 11th  . Highest education level: Not on file  Occupational History  . Occupation: retired    Comment: Working 2 jobs.  Tobacco Use  . Smoking status: Former Smoker    Types: Cigarettes    Quit date: 02/25/2007    Years since quitting: 12.2  . Smokeless tobacco: Never Used  Substance and Sexual Activity  . Alcohol use: No  . Drug use: No  . Sexual activity: Not Currently  Other Topics Concern  . Not on file  Social History Narrative   Widowed. Education: [employer] - 11th grade.   Lives with her daughter and granddaughter.  2 cups caffeine per day.   Right-handed.   Social Determinants of Health   Financial Resource Strain:   . Difficulty of Paying Living Expenses:   Food Insecurity:   . Worried About Charity fundraiser in the Last Year:   . Arboriculturist in the Last Year:   Transportation Needs:   . Film/video editor (Medical):   Marland Kitchen Lack of Transportation (Non-Medical):   Physical Activity:   . Days of Exercise per Week:   . Minutes of Exercise per Session:   Stress:   . Feeling of Stress :   Social Connections:   . Frequency of Communication with Friends and Family:   . Frequency of Social Gatherings with Friends and Family:   . Attends Religious Services:   . Active Member of Clubs or Organizations:   . Attends Archivist Meetings:   Marland Kitchen Marital Status:   Intimate Partner Violence:   . Fear of Current or Ex-Partner:   . Emotionally Abused:   Marland Kitchen Physically Abused:   . Sexually Abused:     Review of Systems: Gen: Denies fever, sweats or chills. No  weight loss.  CV: Denies chest pain, palpitations or edema. Resp: Denies cough, shortness of breath of hemoptysis.  GI: Se HPI. Denies heartburn, dysphagia, stomach or lower abdominal pain.   GU : Denies urinary burning, blood in urine, increased urinary frequency or incontinence. MS: Denies joint pain, muscles aches or weakness. Derm: Denies rash, itchiness, skin lesions or unhealing ulcers. Psych: Denies depression, anxiety, memory loss, suicidal ideation or confusion. Heme: Denies easy bruising, bleeding. Neuro:  Denies headaches, dizziness or paresthesias. Endo:  Denies any problems with DM, thyroid or adrenal function.  Physical Exam: Vital signs in last 24 hours: Temp:  [98 F (36.7 C)-98.2 F (36.8 C)] 98 F (36.7 C) (03/23 0609) Pulse Rate:  [66-101] 66 (03/23 0609) Resp:  [16-20] 16 (03/23 0609) BP: (92-147)/(58-108) 92/58 (03/23 0609) SpO2:  [97 %-100 %] 99 % (03/23 0609) Weight:  [68.9 kg-69.4 kg] 69.4 kg (03/23 0200) Last BM Date: 05/17/19 General:  Alert,  well-developed, well-nourished, pleasant and cooperative in NAD. Head:  Normocephalic and atraumatic. Eyes:  No scleral icterus. Conjunctiva pink. Ears:  Normal auditory acuity. Nose:  No deformity, discharge or lesions. Mouth: Dentition intact. No ulcers or lesions.  Neck:  Supple. No lymphadenopathy or thyromegaly.  Lungs:  Breath sounds clear throughout.  Heart:  RRR, no murmur.  Abdomen:  Soft, nondistended. Nontender. + BS x 4 quads.  Rectal:  Thick smear of bright red blood in the rectal vault. No significant hemorrhoids. No mass.  Msk:  Symmetrical without gross deformities.  Pulses:  Normal pulses noted. Extremities:  Without clubbing or edema. Neurologic:  Alert and  oriented x4. No focal deficits.  Skin:  Intact without significant lesions or rashes. Psych:  Alert and cooperative. Normal mood and affect.  Intake/Output from previous day: 03/22 0701 - 03/23 0700 In: 231.1 [I.V.:131.1; IV  Piggyback:100] Out: -  Intake/Output this shift: No intake/output data recorded.  Lab Results: Recent Labs    05/16/19 2056 05/17/19 0237 05/17/19 0740  WBC 9.7 10.0 8.0  HGB 10.5* 8.9* 7.7*  HCT 34.0* 28.8* 24.8*  PLT 378 307 259   BMET Recent Labs    05/16/19 2056 05/17/19 0237  NA 140 141  K 3.7 4.3  CL 103 108  CO2 26 20*  GLUCOSE 127* 106*  BUN 33* 41*  CREATININE 1.05* 0.84  CALCIUM 9.3  8.6*   LFT Recent Labs    05/17/19 0237  PROT 6.2*  ALBUMIN 3.6  AST 14*  ALT 11  ALKPHOS 43  BILITOT 0.4   PT/INR No results for input(s): LABPROT, INR in the last 72 hours. Hepatitis Panel No results for input(s): HEPBSAG, HCVAB, HEPAIGM, HEPBIGM in the last 72 hours.    Studies/Results: No results found.  IMPRESSION/PLAN:  35. 69 year old female with recurrent painless hematochezia, possible recurrent diverticular bleed. She takes Aleve daily and ASA 35m daily. Admission Hg 10.5 down to 7.7. No further hematochezia since midnight. She is hemodynamically stable.  -Monitor H/H Q 6 hours for now -Transfuse for Hg <  7. -IV NS @ 100cc/hr -Consider repeat abd/pelvic CT angiogram if she has any further hematochezia.  -Keep NPO for now -Continue Pantoprazole 860mhr infusion for now   2. History of colon polyps  3. History of breast cancer  Further recommendations per Dr. PeVerta Ellen Dorathy Daft3/23/2021, 10:35 AM  GI ATTENDING  History, laboratories, x-rays, prior colonoscopy report reviewed.  Patient personally seen and examined.  Agree with comprehensive consultation note as outlined above.  The patient has chronic constipation.  She also has a history of diverticular bleeding.  She presents now with multiple episodes of bright red blood per rectum yesterday.  She has been stable.  However significant drop in hemoglobin from admission level of 10.5 to most recent level of 5.2 for which she is receiving 2 units of blood.  She does take Aleve.  Her  BUN is 41.  She denies melena.  She is on IV PPI.  Last colonoscopy June 2020 with diverticulosis.  Previous CTA documented bleeding in the right colon (cecum).  CT is being repeated at this time.  In addition transfusions as mentioned.  Continue to follow closely.  JoDocia ChuckPeGeri [place] M.D. [facility]

## 2019-05-17 NOTE — Plan of Care (Signed)
Initiated care plan 

## 2019-05-17 NOTE — Progress Notes (Signed)
PROGRESS NOTE  Ellen [name] [id number] DOB: 04-12-1950 DOA: 05/16/2019 PCP: Ellen Ebbs, MD  HPI/Recap of past 24 hours: Ellen [name] is a 69 y.o. female with medical history significant of diverticular disease with previous diverticular GI bleed, history of breast cancer status post surgery and radiation, diabetes, hypertension, hyperlipidemia, morbid obesity, glaucoma who presented to [facility] ED with painless rectal bleed.  Patient has been having bursitis lately and has been taking Aleve.  She has also been on aspirin 81 mg daily.  Follows outpatient with Dr. Ardis [name] from [place].  05/17/19: Seen and examined.  Reports persistent rectal bleed.  Denies  Abdominal pain or nausea.  Hg drop to 7.7.  Will repeat H&H q6H and transfuse Hg drop less than 7.0.  GI [place] consulted and will see in consultation.  BP soft but maintaining MAP>70.  Continue IV fluid.   Assessment/Plan: Principal Problem:   Rectal bleed Active Problems:   HTN (hypertension)   Hyperlipidemia   Obesity (BMI 30-39.9)   Breast cancer of upper-outer quadrant of left female breast ([place])   GI bleed   Diabetes ([place])  Presumptive diverticular bleed Presented with hemoglobin of 10.5 with painless rectal bleed Rectal bleed is persistent Hemoglobin dropped down to 7.7 Type and screen, transfuse hemoglobin less than 7.0 H&H every 6 hours Maintain MAP greater than 65 Hx of left colonic diverticulosis with previous diverticular bleed Last colonoscopy was on June 2020, no polyps or cancer. Follow-up with Dr. Ardis [name] duration GI no ball consulted and will see in consultation  Acute blood loss anemia secondary to diverticular bleed Management as stated above  Essential hypertension Blood pressure is currently soft Hold off antihypertensives in the setting of active GI bleed Maintain MAP greater than 65 Continue IV fluid hydration  Hyperlipidemia LFTs are normal Resume statin  Type 2 diabetes Obtain  hemoglobin A1c, 6.0 on 05/17/2019 Hold off Metformin Continue insulin sliding scale Avoid hypoglycemia  DVT prophylaxis: SCDs; chemical DVT prophylaxis contraindicated in the setting of active GI bleed. Code Status: Full code Family Communication:  We will call family if okay with the patient.  Disposition Plan:  Patient from home.  Anticipate discharge to home in the next 48-72 hours, when rectal bleed has resolved and when GI signs off.  Consults called:  GI [place]    Objective: Vitals:   05/17/19 0020 05/17/19 0150 05/17/19 0200 05/17/19 0609  BP: 104/78 97/62  (!) 92/58  Pulse: 90 68  66  Resp: 20 20  16   Temp:  98.2 F (36.8 C)  98 F (36.7 C)  TempSrc:    Oral  SpO2: 97%   99%  Weight:   69.4 kg   Height:   5\' 2"  (1.575 m)     Intake/Output Summary (Last 24 hours) at 05/17/2019 1025 Last data filed at 05/17/2019 0336 Gross per 24 hour  Intake 231.13 ml  Output --  Net 231.13 ml   Filed Weights   05/16/19 2100 05/17/19 0200  Weight: 68.9 kg 69.4 kg    Exam:  . General: 69 y.o. year-old female well developed well nourished in no acute distress.  Alert and oriented x3. . Cardiovascular: Regular rate and rhythm with no rubs or gallops.  No thyromegaly or JVD noted.   Marland Kitchen Respiratory: Clear to auscultation with no wheezes or rales. Good inspiratory effort. . Abdomen: Soft nontender nondistended with normal bowel sounds x4 quadrants. . Musculoskeletal: No lower extremity edema. 2/4 pulses in all 4 extremities. Marland Kitchen Psychiatry: Mood  is appropriate for condition and setting   Data Reviewed: CBC: Recent Labs  Lab 05/16/19 2056 05/17/19 0237 05/17/19 0740  WBC 9.7 10.0 8.0  HGB 10.5* 8.9* 7.7*  HCT 34.0* 28.8* 24.8*  MCV 83.5 83.2 83.8  PLT 378 307 [id number]   Basic Metabolic Panel: Recent Labs  Lab 05/16/19 2056 05/17/19 0237  NA 140 141  K 3.7 4.3  CL 103 108  CO2 26 20*  GLUCOSE 127* 106*  BUN 33* 41*  CREATININE 1.05* 0.84  CALCIUM 9.3 8.6*    GFR: Estimated Creatinine Clearance: 59.3 mL/min (by C-G formula based on SCr of 0.84 mg/dL). Liver Function Tests: Recent Labs  Lab 05/16/19 2056 05/17/19 0237  AST 17 14*  ALT 13 11  ALKPHOS 55 43  BILITOT 0.5 0.4  PROT 7.3 6.2*  ALBUMIN 4.1 3.6   No results for input(s): LIPASE, AMYLASE in the last 168 hours. No results for input(s): AMMONIA in the last 168 hours. Coagulation Profile: No results for input(s): INR, PROTIME in the last 168 hours. Cardiac Enzymes: No results for input(s): CKTOTAL, CKMB, CKMBINDEX, TROPONINI in the last 168 hours. BNP (last 3 results) No results for input(s): PROBNP in the last 8760 hours. HbA1C: Recent Labs    05/17/19 0237  HGBA1C 6.0*   CBG: Recent Labs  Lab 05/17/19 0756  GLUCAP 99   Lipid Profile: No results for input(s): CHOL, HDL, LDLCALC, TRIG, CHOLHDL, LDLDIRECT in the last 72 hours. Thyroid Function Tests: No results for input(s): TSH, T4TOTAL, FREET4, T3FREE, THYROIDAB in the last 72 hours. Anemia Panel: No results for input(s): VITAMINB12, FOLATE, FERRITIN, TIBC, IRON, RETICCTPCT in the last 72 hours. Urine analysis:    Component Value Date/Time   COLORURINE STRAW (A) 04/22/2017 1938   APPEARANCEUR CLEAR 04/22/2017 1938   LABSPEC 1.010 05/01/2017 1705   PHURINE 7.5 05/01/2017 1705   GLUCOSEU NEGATIVE 05/01/2017 1705   HGBUR TRACE (A) 05/01/2017 1705   BILIRUBINUR NEGATIVE 05/01/2017 1705   BILIRUBINUR Negative 05/30/2014 1508   KETONESUR NEGATIVE 05/01/2017 1705   PROTEINUR NEGATIVE 05/01/2017 1705   UROBILINOGEN 0.2 05/01/2017 1705   NITRITE NEGATIVE 05/01/2017 1705   LEUKOCYTESUR NEGATIVE 05/01/2017 1705   Sepsis Labs: @LABRCNTIP (procalcitonin:4,lacticidven:4)  ) Recent Results (from the past 240 hour(s))  SARS CORONAVIRUS 2 (TAT 6-24 HRS) Nasopharyngeal Nasopharyngeal Swab     Status: None   Collection Time: 05/16/19 11:14 PM   Specimen: Nasopharyngeal Swab  Result Value Ref Range Status   SARS  Coronavirus 2 NEGATIVE NEGATIVE Final    Comment: (NOTE) SARS-CoV-2 target nucleic acids are NOT DETECTED. The SARS-CoV-2 RNA is generally detectable in upper and lower respiratory specimens during the acute phase of infection. Negative results do not preclude SARS-CoV-2 infection, do not rule out co-infections with other pathogens, and should not be used as the sole basis for treatment or other patient management decisions. Negative results must be combined with clinical observations, patient history, and epidemiological information. The expected result is Negative. Fact Sheet for Patients: SugarRoll.be Fact Sheet for Healthcare Providers: https://www.woods-mathews.com/ This test is not yet approved or cleared by the [place] FDA and  has been authorized for detection and/or diagnosis of SARS-CoV-2 by FDA under an Emergency Use Authorization (EUA). This EUA will remain  in effect (meaning this test can be used) for the duration of the COVID-19 declaration under Section 56 4(b)(1) of the Act, 21 U.S.C. section 360bbb-3(b)(1), unless the authorization is terminated or revoked sooner. Performed at [facility], [place]  [address] [place], [place] [address]       Studies: No results found.  Scheduled Meds: . insulin aspart  0-9 Units Subcutaneous TID WC  . [START ON 05/20/2019] pantoprazole  40 mg Intravenous Q12H    Continuous Infusions: . sodium chloride 100 mL/hr at 05/17/19 0240  . pantoprozole (PROTONIX) infusion 8 mg/hr (05/17/19 0807)     LOS: 0 days     Kayleen Memos, MD [facility] Pager [phone]  If 7PM-7AM, please contact night-coverage [email or web address] Password [facility] 05/17/2019, 10:25 AM

## 2019-05-18 LAB — CBC
HCT: 33.1 % — ABNORMAL LOW (ref 36.0–46.0)
Hemoglobin: 10.8 g/dL — ABNORMAL LOW (ref 12.0–15.0)
MCH: 27.8 pg (ref 26.0–34.0)
MCHC: 32.6 g/dL (ref 30.0–36.0)
MCV: 85.1 fL (ref 80.0–100.0)
Platelets: 265 10*3/uL (ref 150–400)
RBC: 3.89 MIL/uL (ref 3.87–5.11)
RDW: 17.5 % — ABNORMAL HIGH (ref 11.5–15.5)
WBC: 7.7 10*3/uL (ref 4.0–10.5)
nRBC: 0 % (ref 0.0–0.2)

## 2019-05-18 LAB — TYPE AND SCREEN
ABO/RH(D): B POS
Antibody Screen: NEGATIVE
Unit division: 0
Unit division: 0

## 2019-05-18 LAB — GLUCOSE, CAPILLARY
Glucose-Capillary: 138 mg/dL — ABNORMAL HIGH (ref 70–99)
Glucose-Capillary: 105 mg/dL — ABNORMAL HIGH (ref 70–99)
Glucose-Capillary: 111 mg/dL — ABNORMAL HIGH (ref 70–99)
Glucose-Capillary: 106 mg/dL — ABNORMAL HIGH (ref 70–99)

## 2019-05-18 LAB — BPAM RBC
Blood Product Expiration Date: 202104182359
Blood Product Expiration Date: 202104182359
ISSUE DATE / TIME: 202103231311
ISSUE DATE / TIME: 202103231535
Unit Type and Rh: 7300
Unit Type and Rh: 7300

## 2019-05-18 LAB — HEMOGLOBIN AND HEMATOCRIT, BLOOD
HCT: 33.2 % — ABNORMAL LOW (ref 36.0–46.0)
HCT: 34.3 % — ABNORMAL LOW (ref 36.0–46.0)
HCT: 36.3 % (ref 36.0–46.0)
Hemoglobin: 10.8 g/dL — ABNORMAL LOW (ref 12.0–15.0)
Hemoglobin: 11.3 g/dL — ABNORMAL LOW (ref 12.0–15.0)
Hemoglobin: 11.8 g/dL — ABNORMAL LOW (ref 12.0–15.0)

## 2019-05-18 LAB — BASIC METABOLIC PANEL
Anion gap: 6 (ref 5–15)
BUN: 12 mg/dL (ref 8–23)
CO2: 23 mmol/L (ref 22–32)
Calcium: 8 mg/dL — ABNORMAL LOW (ref 8.9–10.3)
Chloride: 111 mmol/L (ref 98–111)
Creatinine, Ser: 0.69 mg/dL (ref 0.44–1.00)
GFR calc Af Amer: >60 mL/min (ref >60)
GFR calc non Af Amer: >60 mL/min (ref >60)
Glucose, Bld: 135 mg/dL — ABNORMAL HIGH (ref 70–99)
Potassium: 3.4 mmol/L — ABNORMAL LOW (ref 3.5–5.1)
Sodium: 140 mmol/L (ref 135–145)

## 2019-05-18 MED ORDER — POTASSIUM CHLORIDE 10 MEQ/100ML IV SOLN
10.0000 meq | INTRAVENOUS | Status: AC
  Administered 2019-05-18 (×2): 10 meq via INTRAVENOUS
  Filled 2019-05-18 (×2): qty 100

## 2019-05-18 MED ORDER — POLYETHYLENE GLYCOL 3350 17 G PO PACK
17.0000 g | PACK | Freq: Two times a day (BID) | ORAL | Status: AC
  Administered 2019-05-18 (×2): 17 g via ORAL
  Filled 2019-05-18 (×2): qty 1

## 2019-05-18 NOTE — Progress Notes (Signed)
Had medium size reddish brown form stool.  Definitely positive for blood.

## 2019-05-18 NOTE — Progress Notes (Signed)
PROGRESS NOTE    Ellen [name]  [id number] DOB: 1950/04/16 DOA: 05/16/2019 PCP: Nolene Ebbs, MD   Brief Narrative: 69 year old with past medical history significant for diverticular disease, prior GI bleed from diverticulosis, history of breast cancer status post surgery and radiation, diabetes, hypertension, morbid obese, glaucoma who presents to the ED complaining of painless rectal bleeding.  Patient has been taking Aleve for pain.  Patient admitted with hematochezia.  Hemoglobin dropped to 5.  She received 2 unit of packed red blood cell.  Current hemoglobin around 10.  Assessment & Plan:   Principal Problem:   Rectal bleed Active Problems:   HTN (hypertension)   Hyperlipidemia   Obesity (BMI 30-39.9)   Breast cancer of upper-outer quadrant of left female breast ([place])   GI bleed   Diabetes ([place])  1-presumed diverticular bleed, acute And present with painless hematochezia. Hemoglobin drop down to 5 She received 2 unit of packed red blood cell Diet following and: Recommend continue with clear diet and monitor for further episode of GI bleed Hemoglobin increased to 10 Repeat hemoglobin tonight  2-acute blood loss anemia secondary to diverticular bleed: See #1  Hypertension: Continue to hold blood pressure medication in the setting of GI bleed  Hyperlipidemia continue with statins  Type 2 diabetes: Continue to hold Metformin while inpatient: Sliding scale Hypokalemia: Replete IV  Estimated body mass index is 27.98 kg/m as calculated from the following:   Height as of this encounter: 5\' 2"  (1.575 m).   Weight as of this encounter: 69.4 kg.   DVT prophylaxis: SCD Code Status: Full code Family Communication: Care discussed with patient Disposition Plan:  Patient is from: Home Anticipated d/c date: 1 or 2 days if resolution of GI bleed Barriers to d/c or necessity for inpatient status: Monitor for recurrence of GI bleed  Consultants:   GI  Procedures:   None Antimicrobials:    Subjective: No fever or episode of bloody bowel movement She denies abdominal pain Objective: Vitals:   05/17/19 1802 05/17/19 2137 05/18/19 0445 05/18/19 1127  BP:  124/76 107/64 122/80  Pulse: 68 91 (!) 57 63  Resp:  16 14 16   Temp: 98.5 F (36.9 C) 100 F (37.8 C) 98.9 F (37.2 C) 98.7 F (37.1 C)  TempSrc:  Oral Oral Oral  SpO2: 100% 98% 97% 99%  Weight:      Height:        Intake/Output Summary (Last 24 hours) at 05/18/2019 1435 Last data filed at 05/18/2019 [id number] Gross per 24 hour  Intake 2487.88 ml  Output -  Net 2487.88 ml   Filed Weights   05/16/19 2100 05/17/19 0200  Weight: 68.9 kg 69.4 kg    Examination:  General exam: Appears calm and comfortable  Respiratory system: Clear to auscultation. Respiratory effort normal. Cardiovascular system: S1 & S2 heard, RRR. No JVD, murmurs, rubs, gallops or clicks. No pedal edema. Gastrointestinal system: Abdomen is nondistended, soft and nontender. No organomegaly or masses felt. Normal bowel sounds heard. Central nervous system: Alert and oriented. No focal neurological deficits. Extremities: Symmetric 5 x 5 power. Skin: No rashes, lesions or ulcers Psychiatry: Judgement and insight appear normal. Mood & affect appropriate.     Data Reviewed: I have personally reviewed following labs and imaging studies  CBC: Recent Labs  Lab 05/16/19 2056 05/16/19 2056 05/17/19 0237 05/17/19 0237 05/17/19 0740 05/17/19 1125 05/17/19 2103 05/18/19 0254 05/18/19 1043  WBC 9.7  --  10.0  --  8.0  --   --  7.7  --   HGB 10.5*   < > 8.9*   < > 7.7* 5.2* 12.1 10.8*  10.8* 11.3*  HCT 34.0*   < > 28.8*   < > 24.8* 17.7* 36.7 33.2*  33.1* 34.3*  MCV 83.5  --  83.2  --  83.8  --   --  85.1  --   PLT 378  --  307  --  259  --   --  265  --    < > = values in this interval not displayed.   Basic Metabolic Panel: Recent Labs  Lab 05/16/19 2056 05/17/19 0237 05/18/19 0254  NA 140 141 140  K 3.7  4.3 3.4*  CL 103 108 111  CO2 26 20* 23  GLUCOSE 127* 106* 135*  BUN 33* 41* 12  CREATININE 1.05* 0.84 0.69  CALCIUM 9.3 8.6* 8.0*   GFR: Estimated Creatinine Clearance: 62.3 mL/min (by C-G formula based on SCr of 0.69 mg/dL). Liver Function Tests: Recent Labs  Lab 05/16/19 2056 05/17/19 0237  AST 17 14*  ALT 13 11  ALKPHOS 55 43  BILITOT 0.5 0.4  PROT 7.3 6.2*  ALBUMIN 4.1 3.6   No results for input(s): LIPASE, AMYLASE in the last 168 hours. No results for input(s): AMMONIA in the last 168 hours. Coagulation Profile: No results for input(s): INR, PROTIME in the last 168 hours. Cardiac Enzymes: No results for input(s): CKTOTAL, CKMB, CKMBINDEX, TROPONINI in the last 168 hours. BNP (last 3 results) No results for input(s): PROBNP in the last 8760 hours. HbA1C: Recent Labs    05/17/19 0237  HGBA1C 6.0*   CBG: Recent Labs  Lab 05/17/19 1148 05/17/19 1711 05/17/19 2130 05/18/19 0726 05/18/19 1124  GLUCAP 77 102* 147* 138* 105*   Lipid Profile: No results for input(s): CHOL, HDL, LDLCALC, TRIG, CHOLHDL, LDLDIRECT in the last 72 hours. Thyroid Function Tests: No results for input(s): TSH, T4TOTAL, FREET4, T3FREE, THYROIDAB in the last 72 hours. Anemia Panel: No results for input(s): VITAMINB12, FOLATE, FERRITIN, TIBC, IRON, RETICCTPCT in the last 72 hours. Sepsis Labs: No results for input(s): PROCALCITON, LATICACIDVEN in the last 168 hours.  Recent Results (from the past 240 hour(s))  SARS CORONAVIRUS 2 (TAT 6-24 HRS) Nasopharyngeal Nasopharyngeal Swab     Status: None   Collection Time: 05/16/19 11:14 PM   Specimen: Nasopharyngeal Swab  Result Value Ref Range Status   SARS Coronavirus 2 NEGATIVE NEGATIVE Final    Comment: (NOTE) SARS-CoV-2 target nucleic acids are NOT DETECTED. The SARS-CoV-2 RNA is generally detectable in upper and lower respiratory specimens during the acute phase of infection. Negative results do not preclude SARS-CoV-2 infection, do  not rule out co-infections with other pathogens, and should not be used as the sole basis for treatment or other patient management decisions. Negative results must be combined with clinical observations, patient history, and epidemiological information. The expected result is Negative. Fact Sheet for Patients: SugarRoll.be Fact Sheet for Healthcare Providers: https://www.woods-mathews.com/ This test is not yet approved or cleared by the [place] FDA and  has been authorized for detection and/or diagnosis of SARS-CoV-2 by FDA under an Emergency Use Authorization (EUA). This EUA will remain  in effect (meaning this test can be used) for the duration of the COVID-19 declaration under Section 56 4(b)(1) of the Act, 21 U.S.C. section 360bbb-3(b)(1), unless the authorization is terminated or revoked sooner. Performed at [facility], [place] [address], [place], [place] [address]  Radiology Studies: CT Angio Abd/Pel w/ and/or w/o  Result Date: 05/17/2019 CLINICAL DATA:  GI bleed EXAM: CTA ABDOMEN AND PELVIS WITHOUT AND WITH CONTRAST TECHNIQUE: Multidetector CT imaging of the abdomen and pelvis was performed using the standard protocol during bolus administration of intravenous contrast. Multiplanar reconstructed images and MIPs were obtained and reviewed to evaluate the vascular anatomy. CONTRAST:  150mL OMNIPAQUE IOHEXOL 350 MG/ML SOLN COMPARISON:  None. FINDINGS: VASCULAR Aorta: Normal caliber aorta without aneurysm, dissection, vasculitis or significant stenosis. Celiac: Patent without evidence of aneurysm, dissection, vasculitis or significant stenosis. SMA: Patent without evidence of aneurysm, dissection, vasculitis or significant stenosis. Renals: Both renal arteries are patent without evidence of aneurysm, dissection, vasculitis, fibromuscular dysplasia or significant stenosis. IMA: Patent without evidence of aneurysm, dissection,  vasculitis or significant stenosis. Inflow: Patent without evidence of aneurysm, dissection, vasculitis or significant stenosis. Proximal Outflow: Bilateral common femoral and visualized portions of the superficial and profunda femoral arteries are patent without evidence of aneurysm, dissection, vasculitis or significant stenosis. Veins: No obvious venous abnormality within the limitations of this arterial phase study. Review of the MIP images confirms the above findings. NON-VASCULAR Lower chest: Lung bases are clear. No effusions. Heart is normal size. Hepatobiliary: Low-density lesions within the liver are stable since prior study, most compatible with cysts. Gallbladder unremarkable. Pancreas: No focal abnormality or ductal dilatation. Spleen: No focal abnormality.  Normal size. Adrenals/Urinary Tract: No adrenal abnormality. No focal renal abnormality. No stones or hydronephrosis. Urinary bladder is unremarkable. Stomach/Bowel: Stomach, large and small bowel grossly unremarkable. No visible active extravasation to localize GI bleed. Lymphatic: No adenopathy Reproductive: Prior hysterectomy.  No adnexal masses. Other: No free fluid or free air. Musculoskeletal: No acute bony abnormality. IMPRESSION: VASCULAR Infrarenal abdominal aortic atherosclerosis. No aneurysm or dissection. No active extravasation of contrast within bowel to localize GI bleed. NON-VASCULAR No acute findings in the abdomen or pelvis. Electronically Signed   By: Rolm Baptise M.D.   On: 05/17/2019 20:42        Scheduled Meds: . insulin aspart  0-9 Units Subcutaneous TID WC  . [START ON 05/20/2019] pantoprazole  40 mg Intravenous Q12H  . polyethylene glycol  17 g Oral BID   Continuous Infusions: . dextrose 5% lactated ringers 100 mL/hr at 05/18/19 0628  . pantoprozole (PROTONIX) infusion 8 mg/hr (05/18/19 0627)     LOS: 1 day    Time spent: 35 minutes   [place] Fant A Rosalinda Seaman, MD [facility]   If 7PM-7AM, please  contact night-coverage [email or web address]  05/18/2019, 2:35 PM

## 2019-05-18 NOTE — Progress Notes (Addendum)
Ellen [name]  CC:  Painless hematochezia   Subjective: No bowel movement or blood from the rectum for the past 2 days. No nausea or vomiting. She is tolerating clear liquid diet without difficulty. She reports feeling well. Daughter at the bedside.    Objective:  Vital signs in last 24 hours: Temp:  [98.5 F (36.9 C)-100 F (37.8 C)] 98.9 F (37.2 C) (03/24 0445) Pulse Rate:  [57-91] 57 (03/24 0445) Resp:  [14-20] 14 (03/24 0445) BP: (104-131)/(64-81) 107/64 (03/24 0445) SpO2:  [97 %-100 %] 97 % (03/24 0445) Last BM Date: 04/19/19 General:   Alert, well-developed female in NAD. Heart: RRR, no murmur. Pulm: Breath sounds clear throughout.  Abdomen: Soft, nondistended. Nontender. + BS x 4 quads. No HSM. Central lower abdominal scar intact.  Extremities:  Without edema. Neurologic:  Alert and  oriented x4;  grossly normal neurologically. Psych:  Alert and cooperative. Normal mood and affect.  Intake/Output from previous day: 03/23 0701 - 03/24 0700 In: 3275.4 [I.V.:2645.4; Blood:630] Out: -  Intake/Output this shift: No intake/output data recorded.  Lab Results: Recent Labs    05/17/19 0237 05/17/19 0237 05/17/19 0740 05/17/19 0740 05/17/19 1125 05/17/19 2103 05/18/19 0254  WBC 10.0  --  8.0  --   --   --  7.7  HGB 8.9*   < > 7.7*   < > 5.2* 12.1 10.8*  10.8*  HCT 28.8*   < > 24.8*   < > 17.7* 36.7 33.2*  33.1*  PLT 307  --  259  --   --   --  265   < > = values in this interval not displayed.   BMET Recent Labs    05/16/19 2056 05/17/19 0237 05/18/19 0254  NA 140 141 140  K 3.7 4.3 3.4*  CL 103 108 111  CO2 26 20* 23  GLUCOSE 127* 106* 135*  BUN 33* 41* 12  CREATININE 1.05* 0.84 0.69  CALCIUM 9.3 8.6* 8.0*   LFT Recent Labs    05/17/19 0237  PROT 6.2*  ALBUMIN 3.6  AST 14*  ALT 11  ALKPHOS 43  BILITOT 0.4   PT/INR No results for input(s): LABPROT, INR in the last 72 hours. Hepatitis Panel No results for  input(s): HEPBSAG, HCVAB, HEPAIGM, HEPBIGM in the last 72 hours.  CT Angio Abd/Pel w/ and/or w/o  Result Date: 05/17/2019 CLINICAL DATA:  GI bleed EXAM: CTA ABDOMEN AND PELVIS WITHOUT AND WITH CONTRAST TECHNIQUE: Multidetector CT imaging of the abdomen and pelvis was performed using the standard protocol during bolus administration of intravenous contrast. Multiplanar reconstructed images and MIPs were obtained and reviewed to evaluate the vascular anatomy. CONTRAST:  14mL OMNIPAQUE IOHEXOL 350 MG/ML SOLN COMPARISON:  None. FINDINGS: VASCULAR Aorta: Normal caliber aorta without aneurysm, dissection, vasculitis or significant stenosis. Celiac: Patent without evidence of aneurysm, dissection, vasculitis or significant stenosis. SMA: Patent without evidence of aneurysm, dissection, vasculitis or significant stenosis. Renals: Both renal arteries are patent without evidence of aneurysm, dissection, vasculitis, fibromuscular dysplasia or significant stenosis. IMA: Patent without evidence of aneurysm, dissection, vasculitis or significant stenosis. Inflow: Patent without evidence of aneurysm, dissection, vasculitis or significant stenosis. Proximal Outflow: Bilateral common femoral and visualized portions of the superficial and profunda femoral arteries are patent without evidence of aneurysm, dissection, vasculitis or significant stenosis. Veins: No obvious venous abnormality within the limitations of this arterial phase study. Review of the MIP images confirms the above findings. NON-VASCULAR Lower chest: Lung bases are  clear. No effusions. Heart is normal size. Hepatobiliary: Low-density lesions within the liver are stable since prior study, most compatible with cysts. Gallbladder unremarkable. Pancreas: No focal abnormality or ductal dilatation. Spleen: No focal abnormality.  Normal size. Adrenals/Urinary Tract: No adrenal abnormality. No focal renal abnormality. No stones or hydronephrosis. Urinary bladder is  unremarkable. Stomach/Bowel: Stomach, large and small bowel grossly unremarkable. No visible active extravasation to localize GI bleed. Lymphatic: No adenopathy Reproductive: Prior hysterectomy.  No adnexal masses. Other: No free fluid or free air. Musculoskeletal: No acute bony abnormality. IMPRESSION: VASCULAR Infrarenal abdominal aortic atherosclerosis. No aneurysm or dissection. No active extravasation of contrast within bowel to localize GI bleed. NON-VASCULAR No acute findings in the abdomen or pelvis. Electronically Signed   By: Ellen [name] M.D.   On: 05/17/2019 20:42    Assessment / Plan:  76. 69 year old female with recurrent painless hematochezia, possible recurrent diverticular bleed with acute microcytic anemia.  She takes Aleve daily and ASA 81mg  daily. Admission Hg 10.5 down -> 8.9 ->7.7 -> 5.2 transfused with 2 units of PRBCs 3/23 -> 12.1-> 10.8 -> 10.8. Abd/Pelvic CT angiogram 3/23 without evidence of active GI bleeding.  Rectal exam 3/23 showed smear of thick red blood, no melena.  No BM or blood from the rectum for 24+ hours. She remains hemodynamically stable.  -Continue H/H Q 6 hrs -Transfuse for Hg < 7 -IVF @ 100cc/hr -Continue Clear liquids for now -Continue Pantoprazole infusion 8mg /hr  -Consider  Tagged RBC scan if active hematochezia recurs     2. History of colon polyps. Last colonoscopy was 08/06/2018 which showed diverticulosis left colon.   3. History of breast cancer   Principal Problem:   Rectal bleed Active Problems:   HTN (hypertension)   Hyperlipidemia   Obesity (BMI 30-39.9)   Breast cancer of upper-outer quadrant of left female breast ([place])   GI bleed   Diabetes ([place])     LOS: 1 day   Ellen [name]  05/18/2019, 8:11 AM  GI ATTENDING  Interval history data reviewed.  Patient seen and examined.  Agree with interval progress [name] as outlined above.  No further bleeding.  Hemoglobin of 5.2 seems spurious given response to transfusion.   Hemoglobin 10.8.  This patient is almost certainly had a diverticular bleed which has stopped.  We will give her a light purge.  Continue to monitor stools and blood counts.  Advance diet as tolerated.  If doing well tomorrow she might be discharged home.  Will follow.  Docia Chuck. Geri Seminole., M.D. [facility] G[facility]

## 2019-05-18 NOTE — Progress Notes (Signed)
   05/18/19 1100  Clinical Encounter Type  Visited With Patient  Visit Type Initial;Psychological support;Spiritual support  Referral From Nurse  Consult/Referral To Chaplain  Spiritual Encounters  Spiritual Needs Emotional;Other (Comment) (Spiritual Care Conversation/Support)  Stress Factors  Patient Stress Factors Health changes;Major life changes  Advance Directives (For Healthcare)  Does Patient Have a Medical Advance Directive? No  Would patient like information on creating a medical advance directive? No - Patient declined (Requested information, but does not want to complete yet)   I spoke with Ellen [name] per spiritual care consult to discuss an Forensic scientist. She was unaware of what an Advance Directive was. I provided her with information on the document and she stated that she is comfortable with her next-of-kin, her daughter, making her healthcare decisions in the event that she is ever unable to. A copy of the Advance Directive was provided to her to read over, and I let her know that if she changes her mind that we would be happy to help.  I also provided emotional and spiritual support for Ellen [name].   Please, contact Spiritual care for further assistance.   Chaplain Shanon Ace M.Div., [facility]

## 2019-05-19 LAB — BASIC METABOLIC PANEL
Anion gap: 7 (ref 5–15)
BUN: 7 mg/dL — ABNORMAL LOW (ref 8–23)
CO2: 24 mmol/L (ref 22–32)
Calcium: 8.3 mg/dL — ABNORMAL LOW (ref 8.9–10.3)
Chloride: 110 mmol/L (ref 98–111)
Creatinine, Ser: 0.65 mg/dL (ref 0.44–1.00)
GFR calc Af Amer: >60 mL/min (ref >60)
GFR calc non Af Amer: >60 mL/min (ref >60)
Glucose, Bld: 125 mg/dL — ABNORMAL HIGH (ref 70–99)
Potassium: 3.6 mmol/L (ref 3.5–5.1)
Sodium: 141 mmol/L (ref 135–145)

## 2019-05-19 LAB — CBC
HCT: 35.7 % — ABNORMAL LOW (ref 36.0–46.0)
HCT: 36.7 % (ref 36.0–46.0)
Hemoglobin: 11.5 g/dL — ABNORMAL LOW (ref 12.0–15.0)
Hemoglobin: 11.8 g/dL — ABNORMAL LOW (ref 12.0–15.0)
MCH: 27.6 pg (ref 26.0–34.0)
MCH: 27.7 pg (ref 26.0–34.0)
MCHC: 32.2 g/dL (ref 30.0–36.0)
MCHC: 32.2 g/dL (ref 30.0–36.0)
MCV: 85.6 fL (ref 80.0–100.0)
MCV: 86.2 fL (ref 80.0–100.0)
Platelets: 264 10*3/uL (ref 150–400)
Platelets: 263 10*3/uL (ref 150–400)
RBC: 4.17 MIL/uL (ref 3.87–5.11)
RBC: 4.26 MIL/uL (ref 3.87–5.11)
RDW: 17.9 % — ABNORMAL HIGH (ref 11.5–15.5)
RDW: 17.6 % — ABNORMAL HIGH (ref 11.5–15.5)
WBC: 4.9 10*3/uL (ref 4.0–10.5)
WBC: 5.3 10*3/uL (ref 4.0–10.5)
nRBC: 0 % (ref 0.0–0.2)
nRBC: 0 % (ref 0.0–0.2)

## 2019-05-19 LAB — GLUCOSE, CAPILLARY
Glucose-Capillary: 111 mg/dL — ABNORMAL HIGH (ref 70–99)
Glucose-Capillary: 99 mg/dL (ref 70–99)
Glucose-Capillary: 95 mg/dL (ref 70–99)
Glucose-Capillary: 94 mg/dL (ref 70–99)

## 2019-05-19 NOTE — Progress Notes (Addendum)
[facility] Progress Note  CC:  Painless hematochezia   Subjective: She reports feeling well. She reported passing a small amount of stool described as red velvet cake last night and this morning. No abdominal pain. No N/V.    Objective:  Vital signs in last 24 hours: Temp:  [97.5 F (36.4 C)-98.7 F (37.1 C)] 97.5 F (36.4 C) (03/25 0547) Pulse Rate:  [50-63] 50 (03/25 0547) Resp:  [16] 16 (03/24 2203) BP: (114-146)/(69-80) 114/69 (03/25 0547) SpO2:  [97 %-99 %] 99 % (03/25 0547) Last BM Date: 05/17/19 General:   Alert, well developed in NAD.  Heart: RRR, no murmur.  Pulm:  Lungs clear throughout.  Abdomen: Soft, nondistended. Nontender. + BS x 4 quadrants. No HSM. Central lower abdominal scar intact.  Extremities:  Without edema. Neurologic:  Alert and  oriented x4;  grossly normal neurologically. Psych:  Alert and cooperative. Normal mood and affect.  Intake/Output from previous day: 03/24 0701 - 03/25 0700 In: 3151.7 [P.O.:400; I.V.:2561.2; IV Piggyback:190.5] Out: 1400 [Urine:1400] Intake/Output this shift: No intake/output data recorded.  Lab Results: Recent Labs    05/17/19 0740 05/17/19 1125 05/18/19 0254 05/18/19 0254 05/18/19 1043 05/18/19 1844 05/19/19 0408  WBC 8.0  --  7.7  --   --   --  4.9  HGB 7.7*   < > 10.8*  10.8*   < > 11.3* 11.8* 11.5*  HCT 24.8*   < > 33.2*  33.1*   < > 34.3* 36.3 35.7*  PLT 259  --  265  --   --   --  264   < > = values in this interval not displayed.   BMET Recent Labs    05/17/19 0237 05/18/19 0254 05/19/19 0408  NA 141 140 141  K 4.3 3.4* 3.6  CL 108 111 110  CO2 20* 23 24  GLUCOSE 106* 135* 125*  BUN 41* 12 7*  CREATININE 0.84 0.69 0.65  CALCIUM 8.6* 8.0* 8.3*   LFT Recent Labs    05/17/19 0237  PROT 6.2*  ALBUMIN 3.6  AST 14*  ALT 11  ALKPHOS 43  BILITOT 0.4   PT/INR No results for input(s): LABPROT, INR in the last 72 hours. Hepatitis Panel No results for input(s):  HEPBSAG, HCVAB, HEPAIGM, HEPBIGM in the last 72 hours.  CT Angio Abd/Pel w/ and/or w/o  Result Date: 05/17/2019 CLINICAL DATA:  GI bleed EXAM: CTA ABDOMEN AND PELVIS WITHOUT AND WITH CONTRAST TECHNIQUE: Multidetector CT imaging of the abdomen and pelvis was performed using the standard protocol during bolus administration of intravenous contrast. Multiplanar reconstructed images and MIPs were obtained and reviewed to evaluate the vascular anatomy. CONTRAST:  128mL OMNIPAQUE IOHEXOL 350 MG/ML SOLN COMPARISON:  None. FINDINGS: VASCULAR Aorta: Normal caliber aorta without aneurysm, dissection, vasculitis or significant stenosis. Celiac: Patent without evidence of aneurysm, dissection, vasculitis or significant stenosis. SMA: Patent without evidence of aneurysm, dissection, vasculitis or significant stenosis. Renals: Both renal arteries are patent without evidence of aneurysm, dissection, vasculitis, fibromuscular dysplasia or significant stenosis. IMA: Patent without evidence of aneurysm, dissection, vasculitis or significant stenosis. Inflow: Patent without evidence of aneurysm, dissection, vasculitis or significant stenosis. Proximal Outflow: Bilateral common femoral and visualized portions of the superficial and profunda femoral arteries are patent without evidence of aneurysm, dissection, vasculitis or significant stenosis. Veins: No obvious venous abnormality within the limitations of this arterial phase study. Review of the MIP images confirms the above findings. NON-VASCULAR Lower chest: Lung bases are clear. No  effusions. Heart is normal size. Hepatobiliary: Low-density lesions within the liver are stable since prior study, most compatible with cysts. Gallbladder unremarkable. Pancreas: No focal abnormality or ductal dilatation. Spleen: No focal abnormality.  Normal size. Adrenals/Urinary Tract: No adrenal abnormality. No focal renal abnormality. No stones or hydronephrosis. Urinary bladder is unremarkable.  Stomach/Bowel: Stomach, large and small bowel grossly unremarkable. No visible active extravasation to localize GI bleed. Lymphatic: No adenopathy Reproductive: Prior hysterectomy.  No adnexal masses. Other: No free fluid or free air. Musculoskeletal: No acute bony abnormality. IMPRESSION: VASCULAR Infrarenal abdominal aortic atherosclerosis. No aneurysm or dissection. No active extravasation of contrast within bowel to localize GI bleed. NON-VASCULAR No acute findings in the abdomen or pelvis. Electronically Signed   By: Rolm Baptise M.D.   On: 05/17/2019 20:42    Assessment / Plan:  35. 69 year old female with recurrent painless hematochezia, possible recurrent diverticular bleed with acute microcytic anemia.  She takes Aleve daily and ASA 81mg  daily. Last colonoscopy 07/2018 showed diverticulosis left colon. Admission Hg 10.5 down -> 8.9 ->7.7 -> 5.2 transfused with 2 units of PRBCs 3/23 -> 12.1-> 10.8 -> 10.8 ->11.3. Today Hg 11.5. Abd/Pelvic CT angiogram 3/23 without evidence of active GI bleeding. She received 2 doses of Miralax yesterday. She passed a formed red stool "like red velvet cake" last night and earlier this morning. She remains hemodynamically stable.  -Continue Clear liquid diet for now -IVF 100cc/hr -Monitor closely for further GI bleeding.  -Repeat H/H this afternoon  -? Change PPI infusion to 40mg  IV bid -No repeat colonoscopy planned   Further recommendations per Dr. Henrene Pastor.  See below   Principal Problem:   Rectal bleed Active Problems:   HTN (hypertension)   Hyperlipidemia   Obesity (BMI 30-39.9)   Breast cancer of upper-outer quadrant of left female breast ([place])   GI bleed   Diabetes ([place])     LOS: 2 days   Patrecia Pour Kennedy-Smith  05/19/2019, 10:27 AM  GI ATTENDING  Interval history data reviewed.  Patient personally seen and examined.  Agree with interval progress note as outlined above.  Patient passed all blood.  No active bleeding.  Hemodynamically  stable.  Hemoglobin remains incredibly stable as well.  Advance diet.  Okay for discharge home tomorrow if no further bleeding.  We will sign off.  Docia Chuck. Geri Seminole., M.D. [facility] Gastroenter[facility]

## 2019-05-19 NOTE — Plan of Care (Signed)
  Problem: Clinical Measurements: Goal: Ability to maintain clinical measurements within normal limits will improve Outcome: Progressing Goal: Will remain free from infection Outcome: Progressing   

## 2019-05-19 NOTE — Progress Notes (Signed)
PROGRESS NOTE    Ellen [name]  [id number] DOB: 1950-12-28 DOA: 05/16/2019 PCP: Nolene Ebbs, MD   Brief Narrative: 69 year old with past medical history significant for diverticular disease, prior GI bleed from diverticulosis, history of breast cancer status post surgery and radiation, diabetes, hypertension, morbid obese, glaucoma who presents to the ED complaining of painless rectal bleeding.  Patient has been taking Aleve for pain.  Patient admitted with hematochezia.  Hemoglobin dropped to 5.  She received 2 unit of packed red blood cell.  Current hemoglobin around 10.  Assessment & Plan:   Principal Problem:   Rectal bleed Active Problems:   HTN (hypertension)   Hyperlipidemia   Obesity (BMI 30-39.9)   Breast cancer of upper-outer quadrant of left female breast ([place])   GI bleed   Diabetes ([place])  1-presumed diverticular bleed, acute And present with painless hematochezia. Hemoglobin drop down to 5 She received 2 unit of packed red blood cell Diet following and: Recommend continue with clear diet and monitor for further episode of GI bleed Hb stable. Had some redish stool./  Plan to monitor overnight.  Advance diet per gi.   2-acute blood loss anemia secondary to diverticular bleed: See #1 Hb stable.   Hypertension: Continue to hold blood pressure medication in the setting of GI bleed  Hyperlipidemia continue with statins  Type 2 diabetes: Continue to hold Metformin while inpatient: Sliding scale Hypokalemia: resolved.   Estimated body mass index is 27.98 kg/m as calculated from the following:   Height as of this encounter: 5\' 2"  (1.575 m).   Weight as of this encounter: 69.4 kg.   DVT prophylaxis: SCD Code Status: Full code Family Communication: Care discussed with patient Disposition Plan:  Patient is from: Home Anticipated d/c date: 1  day if resolution of GI bleed Barriers to d/c or necessity for inpatient status: Monitor for recurrence of GI  bleed  Consultants:   GI  Procedures:  None Antimicrobials:    Subjective: Had BM with reddish stool.  Denies abdominal pain.   Objective: Vitals:   05/18/19 2203 05/19/19 0547 05/19/19 0900 05/19/19 1000  BP: (!) 146/77 114/69    Pulse: (!) 52 (!) 50    Resp: 16  14 (!) 22  Temp: 98.4 F (36.9 C) (!) 97.5 F (36.4 C)    TempSrc: Oral Oral    SpO2: 97% 99%    Weight:      Height:        Intake/Output Summary (Last 24 hours) at 05/19/2019 1138 Last data filed at 05/19/2019 0553 Gross per 24 hour  Intake 3151.66 ml  Output 1400 ml  Net 1751.66 ml   Filed Weights   05/16/19 2100 05/17/19 0200  Weight: 68.9 kg 69.4 kg    Examination:  General exam: NAD Respiratory system: CTA Cardiovascular system: S 1, S 2 RRR Gastrointestinal system: BS present, soft, nt Central nervous system: alert. Non focal.  Extremities: S symmetric power.    Data Reviewed: I have personally reviewed following labs and imaging studies  CBC: Recent Labs  Lab 05/16/19 2056 05/16/19 2056 05/17/19 0237 05/17/19 0237 05/17/19 0740 05/17/19 1125 05/17/19 2103 05/18/19 0254 05/18/19 1043 05/18/19 1844 05/19/19 0408  WBC 9.7  --  10.0  --  8.0  --   --  7.7  --   --  4.9  HGB 10.5*   < > 8.9*   < > 7.7*   < > 12.1 10.8*  10.8* 11.3* 11.8* 11.5*  HCT 34.0*   < >  28.8*   < > 24.8*   < > 36.7 33.2*  33.1* 34.3* 36.3 35.7*  MCV 83.5  --  83.2  --  83.8  --   --  85.1  --   --  85.6  PLT 378  --  307  --  259  --   --  265  --   --  264   < > = values in this interval not displayed.   Basic Metabolic Panel: Recent Labs  Lab 05/16/19 2056 05/17/19 0237 05/18/19 0254 05/19/19 0408  NA 140 141 140 141  K 3.7 4.3 3.4* 3.6  CL 103 108 111 110  CO2 26 20* 23 24  GLUCOSE 127* 106* 135* 125*  BUN 33* 41* 12 7*  CREATININE 1.05* 0.84 0.69 0.65  CALCIUM 9.3 8.6* 8.0* 8.3*   GFR: Estimated Creatinine Clearance: 62.3 mL/min (by C-G formula based on SCr of 0.65 mg/dL). Liver  Function Tests: Recent Labs  Lab 05/16/19 2056 05/17/19 0237  AST 17 14*  ALT 13 11  ALKPHOS 55 43  BILITOT 0.5 0.4  PROT 7.3 6.2*  ALBUMIN 4.1 3.6   No results for input(s): LIPASE, AMYLASE in the last 168 hours. No results for input(s): AMMONIA in the last 168 hours. Coagulation Profile: No results for input(s): INR, PROTIME in the last 168 hours. Cardiac Enzymes: No results for input(s): CKTOTAL, CKMB, CKMBINDEX, TROPONINI in the last 168 hours. BNP (last 3 results) No results for input(s): PROBNP in the last 8760 hours. HbA1C: Recent Labs    05/17/19 0237  HGBA1C 6.0*   CBG: Recent Labs  Lab 05/18/19 0726 05/18/19 1124 05/18/19 1705 05/18/19 2205 05/19/19 0812  GLUCAP 138* 105* 111* 106* 111*   Lipid Profile: No results for input(s): CHOL, HDL, LDLCALC, TRIG, CHOLHDL, LDLDIRECT in the last 72 hours. Thyroid Function Tests: No results for input(s): TSH, T4TOTAL, FREET4, T3FREE, THYROIDAB in the last 72 hours. Anemia Panel: No results for input(s): VITAMINB12, FOLATE, FERRITIN, TIBC, IRON, RETICCTPCT in the last 72 hours. Sepsis Labs: No results for input(s): PROCALCITON, LATICACIDVEN in the last 168 hours.  Recent Results (from the past 240 hour(s))  SARS CORONAVIRUS 2 (TAT 6-24 HRS) Nasopharyngeal Nasopharyngeal Swab     Status: None   Collection Time: 05/16/19 11:14 PM   Specimen: Nasopharyngeal Swab  Result Value Ref Range Status   SARS Coronavirus 2 NEGATIVE NEGATIVE Final    Comment: (NOTE) SARS-CoV-2 target nucleic acids are NOT DETECTED. The SARS-CoV-2 RNA is generally detectable in upper and lower respiratory specimens during the acute phase of infection. Negative results do not preclude SARS-CoV-2 infection, do not rule out co-infections with other pathogens, and should not be used as the sole basis for treatment or other patient management decisions. Negative results must be combined with clinical observations, patient history, and  epidemiological information. The expected result is Negative. Fact Sheet for Patients: SugarRoll.be Fact Sheet for Healthcare Providers: https://www.woods-mathews.com/ This test is not yet approved or cleared by the [place] FDA and  has been authorized for detection and/or diagnosis of SARS-CoV-2 by FDA under an Emergency Use Authorization (EUA). This EUA will remain  in effect (meaning this test can be used) for the duration of the COVID-19 declaration under Section 56 4(b)(1) of the Act, 21 U.S.C. section 360bbb-3(b)(1), unless the authorization is terminated or revoked sooner. Performed at [facility], [place] [address] [place], [place] [address]          Radiology Studies: CT Angio  Abd/Pel w/ and/or w/o  Result Date: 05/17/2019 CLINICAL DATA:  GI bleed EXAM: CTA ABDOMEN AND PELVIS WITHOUT AND WITH CONTRAST TECHNIQUE: Multidetector CT imaging of the abdomen and pelvis was performed using the standard protocol during bolus administration of intravenous contrast. Multiplanar reconstructed images and MIPs were obtained and reviewed to evaluate the vascular anatomy. CONTRAST:  15mL OMNIPAQUE IOHEXOL 350 MG/ML SOLN COMPARISON:  None. FINDINGS: VASCULAR Aorta: Normal caliber aorta without aneurysm, dissection, vasculitis or significant stenosis. Celiac: Patent without evidence of aneurysm, dissection, vasculitis or significant stenosis. SMA: Patent without evidence of aneurysm, dissection, vasculitis or significant stenosis. Renals: Both renal arteries are patent without evidence of aneurysm, dissection, vasculitis, fibromuscular dysplasia or significant stenosis. IMA: Patent without evidence of aneurysm, dissection, vasculitis or significant stenosis. Inflow: Patent without evidence of aneurysm, dissection, vasculitis or significant stenosis. Proximal Outflow: Bilateral common femoral and visualized portions of the superficial and profunda  femoral arteries are patent without evidence of aneurysm, dissection, vasculitis or significant stenosis. Veins: No obvious venous abnormality within the limitations of this arterial phase study. Review of the MIP images confirms the above findings. NON-VASCULAR Lower chest: Lung bases are clear. No effusions. Heart is normal size. Hepatobiliary: Low-density lesions within the liver are stable since prior study, most compatible with cysts. Gallbladder unremarkable. Pancreas: No focal abnormality or ductal dilatation. Spleen: No focal abnormality.  Normal size. Adrenals/Urinary Tract: No adrenal abnormality. No focal renal abnormality. No stones or hydronephrosis. Urinary bladder is unremarkable. Stomach/Bowel: Stomach, large and small bowel grossly unremarkable. No visible active extravasation to localize GI bleed. Lymphatic: No adenopathy Reproductive: Prior hysterectomy.  No adnexal masses. Other: No free fluid or free air. Musculoskeletal: No acute bony abnormality. IMPRESSION: VASCULAR Infrarenal abdominal aortic atherosclerosis. No aneurysm or dissection. No active extravasation of contrast within bowel to localize GI bleed. NON-VASCULAR No acute findings in the abdomen or pelvis. Electronically Signed   By: Rolm Baptise M.D.   On: 05/17/2019 20:42        Scheduled Meds: . insulin aspart  0-9 Units Subcutaneous TID WC  . [START ON 05/20/2019] pantoprazole  40 mg Intravenous Q12H   Continuous Infusions: . dextrose 5% lactated ringers 100 mL/hr at 05/19/19 0153  . pantoprozole (PROTONIX) infusion 8 mg/hr (05/19/19 0152)     LOS: 2 days    Time spent: 35 minutes   Viaan Knippenberg A Christan Defranco, MD [facility]   If 7PM-7AM, please contact night-coverage [email or web address]  05/19/2019, 11:38 AM

## 2019-05-20 LAB — BASIC METABOLIC PANEL
Anion gap: 6 (ref 5–15)
BUN: 7 mg/dL — ABNORMAL LOW (ref 8–23)
CO2: 24 mmol/L (ref 22–32)
Calcium: 8.8 mg/dL — ABNORMAL LOW (ref 8.9–10.3)
Chloride: 109 mmol/L (ref 98–111)
Creatinine, Ser: 0.85 mg/dL (ref 0.44–1.00)
GFR calc Af Amer: >60 mL/min (ref >60)
GFR calc non Af Amer: >60 mL/min (ref >60)
Glucose, Bld: 102 mg/dL — ABNORMAL HIGH (ref 70–99)
Potassium: 3.9 mmol/L (ref 3.5–5.1)
Sodium: 139 mmol/L (ref 135–145)

## 2019-05-20 LAB — CBC
HCT: 36.6 % (ref 36.0–46.0)
HCT: 38.5 % (ref 36.0–46.0)
Hemoglobin: 11.9 g/dL — ABNORMAL LOW (ref 12.0–15.0)
Hemoglobin: 12.4 g/dL (ref 12.0–15.0)
MCH: 27.5 pg (ref 26.0–34.0)
MCH: 27.3 pg (ref 26.0–34.0)
MCHC: 32.5 g/dL (ref 30.0–36.0)
MCHC: 32.2 g/dL (ref 30.0–36.0)
MCV: 84.5 fL (ref 80.0–100.0)
MCV: 84.8 fL (ref 80.0–100.0)
Platelets: 273 10*3/uL (ref 150–400)
Platelets: 248 10*3/uL (ref 150–400)
RBC: 4.33 MIL/uL (ref 3.87–5.11)
RBC: 4.54 MIL/uL (ref 3.87–5.11)
RDW: 17.6 % — ABNORMAL HIGH (ref 11.5–15.5)
RDW: 17.7 % — ABNORMAL HIGH (ref 11.5–15.5)
WBC: 7.1 10*3/uL (ref 4.0–10.5)
WBC: 9.4 10*3/uL (ref 4.0–10.5)
nRBC: 0 % (ref 0.0–0.2)
nRBC: 0 % (ref 0.0–0.2)

## 2019-05-20 LAB — GLUCOSE, CAPILLARY
Glucose-Capillary: 106 mg/dL — ABNORMAL HIGH (ref 70–99)
Glucose-Capillary: 84 mg/dL (ref 70–99)
Glucose-Capillary: 97 mg/dL (ref 70–99)
Glucose-Capillary: 101 mg/dL — ABNORMAL HIGH (ref 70–99)

## 2019-05-20 MED ORDER — POLYETHYLENE GLYCOL 3350 17 G PO PACK
17.0000 g | PACK | Freq: Two times a day (BID) | ORAL | Status: DC
  Administered 2019-05-20 – 2019-05-21 (×2): 17 g via ORAL
  Filled 2019-05-20 (×2): qty 1

## 2019-05-20 NOTE — Plan of Care (Signed)

## 2019-05-20 NOTE — TOC Progression Note (Signed)
Transition of Care [facility]) - Progression Note    Patient Details  Name: Ellen [name] MRN: [id number] Date of Birth: 01/24/1951  [facility] CM/SW Contact  Purcell Mouton, RN Phone Number: 05/20/2019, 3:58 PM  Clinical Narrative:    Pt plan to discharge home with no needs at present time.    Expected Discharge Plan: Home/Self Care Barriers to Discharge: No Barriers Identified  Expected Discharge Plan and Services Expected Discharge Plan: Home/Self Care       Living arrangements for the past 2 months: Single Family Home                                       Social Determinants of Health (SDOH) Interventions    Readmission Risk Interventions No flowsheet data found.

## 2019-05-20 NOTE — Progress Notes (Signed)
PROGRESS NOTE    Ellen [name]  [id number] DOB: 1950-07-24 DOA: 05/16/2019 PCP: Nolene Ebbs, MD   Brief Narrative: 69 year old with past medical history significant for diverticular disease, prior GI bleed from diverticulosis, history of breast cancer status post surgery and radiation, diabetes, hypertension, morbid obese, glaucoma who presents to the ED complaining of painless rectal bleeding.  Patient has been taking Aleve for pain.  Patient admitted with hematochezia.  Hemoglobin dropped to 5.  She received 2 unit of packed red blood cell.  Current hemoglobin around 10.  Assessment & Plan:   Principal Problem:   Rectal bleed Active Problems:   HTN (hypertension)   Hyperlipidemia   Obesity (BMI 30-39.9)   Breast cancer of upper-outer quadrant of left female breast ([place])   GI bleed   Diabetes ([place])  1-Presumed diverticular bleed, acute: And present with painless hematochezia. Hemoglobin drop down to 5 She received 2 unit of packed red blood cell Diet following and: Recommend continue with clear diet and monitor for further episode of GI bleed Hb stable. Had some redish stool./  Plan to monitor overnight.  Continue with full liquid diet. Patient had 2 more BM movement with reddish stool. Discussed with GI, plan to repeat hb tomorrow and start miralax.   2-acute blood loss anemia secondary to diverticular bleed: See #1 Hb stable.   Hypertension: Continue to hold blood pressure medication in the setting of GI bleed  Hyperlipidemia continue with statins  Type 2 diabetes: Continue to hold Metformin while inpatient: Sliding scale Hypokalemia: resolved.   Estimated body mass index is 27.98 kg/m as calculated from the following:   Height as of this encounter: 5\' 2"  (1.575 m).   Weight as of this encounter: 69.4 kg.   DVT prophylaxis: SCD Code Status: Full code Family Communication: Care discussed with patient Disposition Plan:  Patient is from: Home Anticipated  d/c date: 1  day if resolution of GI bleed Barriers to d/c or necessity for inpatient status: continue to have bloody stool, plan  To monitor overnight.   Consultants:   GI  Procedures:  None Antimicrobials:    Subjective: Patient had 2  reddish stool.  Denies abdominal pain   Objective: Vitals:   05/19/19 1700 05/19/19 1800 05/19/19 2128 05/20/19 0624  BP:   133/75 123/72  Pulse:   (!) 56 (!) 51  Resp: 14 (!) 23 20 20   Temp:   99.3 F (37.4 C) 98.5 F (36.9 C)  TempSrc:   Oral Oral  SpO2:   100% 99%  Weight:      Height:        Intake/Output Summary (Last 24 hours) at 05/20/2019 1326 Last data filed at 05/20/2019 [id number] Gross per 24 hour  Intake 2066.72 ml  Output 0 ml  Net 2066.72 ml   Filed Weights   05/16/19 2100 05/17/19 0200  Weight: 68.9 kg 69.4 kg    Examination:  General exam: NAD Respiratory system: CTA Cardiovascular system: S 1, S 2 RRR Gastrointestinal system: BS present, soft, nt Central nervous system: Non focal.  Extremities: Symmetric power. .    Data Reviewed: I have personally reviewed following labs and imaging studies  CBC: Recent Labs  Lab 05/17/19 0740 05/17/19 1125 05/18/19 0254 05/18/19 0254 05/18/19 1043 05/18/19 1844 05/19/19 0408 05/19/19 1803 05/20/19 0431  WBC 8.0  --  7.7  --   --   --  4.9 5.3 7.1  HGB 7.7*   < > 10.8*  10.8*   < >  11.3* 11.8* 11.5* 11.8* 11.9*  HCT 24.8*   < > 33.2*  33.1*   < > 34.3* 36.3 35.7* 36.7 36.6  MCV 83.8  --  85.1  --   --   --  85.6 86.2 84.5  PLT 259  --  265  --   --   --  264 263 273   < > = values in this interval not displayed.   Basic Metabolic Panel: Recent Labs  Lab 05/16/19 2056 05/17/19 0237 05/18/19 0254 05/19/19 0408 05/20/19 0431  NA 140 141 140 141 139  K 3.7 4.3 3.4* 3.6 3.9  CL 103 108 111 110 109  CO2 26 20* 23 24 24   GLUCOSE 127* 106* 135* 125* 102*  BUN 33* 41* 12 7* 7*  CREATININE 1.05* 0.84 0.69 0.65 0.85  CALCIUM 9.3 8.6* 8.0* 8.3* 8.8*    GFR: Estimated Creatinine Clearance: 58.6 mL/min (by C-G formula based on SCr of 0.85 mg/dL). Liver Function Tests: Recent Labs  Lab 05/16/19 2056 05/17/19 0237  AST 17 14*  ALT 13 11  ALKPHOS 55 43  BILITOT 0.5 0.4  PROT 7.3 6.2*  ALBUMIN 4.1 3.6   No results for input(s): LIPASE, AMYLASE in the last 168 hours. No results for input(s): AMMONIA in the last 168 hours. Coagulation Profile: No results for input(s): INR, PROTIME in the last 168 hours. Cardiac Enzymes: No results for input(s): CKTOTAL, CKMB, CKMBINDEX, TROPONINI in the last 168 hours. BNP (last 3 results) No results for input(s): PROBNP in the last 8760 hours. HbA1C: No results for input(s): HGBA1C in the last 72 hours. CBG: Recent Labs  Lab 05/19/19 1218 05/19/19 1645 05/19/19 2056 05/20/19 0736 05/20/19 1204  GLUCAP 99 95 94 106* 84   Lipid Profile: No results for input(s): CHOL, HDL, LDLCALC, TRIG, CHOLHDL, LDLDIRECT in the last 72 hours. Thyroid Function Tests: No results for input(s): TSH, T4TOTAL, FREET4, T3FREE, THYROIDAB in the last 72 hours. Anemia Panel: No results for input(s): VITAMINB12, FOLATE, FERRITIN, TIBC, IRON, RETICCTPCT in the last 72 hours. Sepsis Labs: No results for input(s): PROCALCITON, LATICACIDVEN in the last 168 hours.  Recent Results (from the past 240 hour(s))  SARS CORONAVIRUS 2 (TAT 6-24 HRS) Nasopharyngeal Nasopharyngeal Swab     Status: None   Collection Time: 05/16/19 11:14 PM   Specimen: Nasopharyngeal Swab  Result Value Ref Range Status   SARS Coronavirus 2 NEGATIVE NEGATIVE Final    Comment: (NOTE) SARS-CoV-2 target nucleic acids are NOT DETECTED. The SARS-CoV-2 RNA is generally detectable in upper and lower respiratory specimens during the acute phase of infection. Negative results do not preclude SARS-CoV-2 infection, do not rule out co-infections with other pathogens, and should not be used as the sole basis for treatment or other patient management  decisions. Negative results must be combined with clinical observations, patient history, and epidemiological information. The expected result is Negative. Fact Sheet for Patients: SugarRoll.be Fact Sheet for Healthcare Providers: https://www.woods-mathews.com/ This test is not yet approved or cleared by the [place] FDA and  has been authorized for detection and/or diagnosis of SARS-CoV-2 by FDA under an Emergency Use Authorization (EUA). This EUA will remain  in effect (meaning this test can be used) for the duration of the COVID-19 declaration under Section 56 4(b)(1) of the Act, 21 U.S.C. section 360bbb-3(b)(1), unless the authorization is terminated or revoked sooner. Performed at [facility], [place] [address] [place], [place] [address]          Radiology  Studies: No results found.      Scheduled Meds: . insulin aspart  0-9 Units Subcutaneous TID WC  . pantoprazole  40 mg Intravenous Q12H   Continuous Infusions:    LOS: 3 days    Time spent: 35 minutes   Bell Cai A Theodoro Koval, MD [facility]   If 7PM-7AM, please contact night-coverage [email or web address]  05/20/2019, 1:26 PM

## 2019-05-21 LAB — CBC
HCT: 37.7 % (ref 36.0–46.0)
Hemoglobin: 12.2 g/dL (ref 12.0–15.0)
MCH: 28 pg (ref 26.0–34.0)
MCHC: 32.4 g/dL (ref 30.0–36.0)
MCV: 86.5 fL (ref 80.0–100.0)
Platelets: 283 10*3/uL (ref 150–400)
RBC: 4.36 MIL/uL (ref 3.87–5.11)
RDW: 17.4 % — ABNORMAL HIGH (ref 11.5–15.5)
WBC: 7.2 10*3/uL (ref 4.0–10.5)
nRBC: 0 % (ref 0.0–0.2)

## 2019-05-21 LAB — GLUCOSE, CAPILLARY
Glucose-Capillary: 88 mg/dL (ref 70–99)
Glucose-Capillary: 128 mg/dL — ABNORMAL HIGH (ref 70–99)

## 2019-05-21 MED ORDER — PANTOPRAZOLE SODIUM 40 MG PO TBEC
40.0000 mg | DELAYED_RELEASE_TABLET | Freq: Every day | ORAL | Status: DC
  Administered 2019-05-21: 40 mg via ORAL
  Filled 2019-05-21: qty 1

## 2019-05-21 MED ORDER — PANTOPRAZOLE SODIUM 40 MG PO TBEC: 40.0000 mg | DELAYED_RELEASE_TABLET | Freq: Every day | ORAL | 0 refills | Status: AC

## 2019-05-21 NOTE — Plan of Care (Signed)
  Problem: Clinical Measurements: Goal: Cardiovascular complication will be avoided Outcome: Progressing   Problem: Bowel/Gastric: Goal: Will show no signs and symptoms of gastrointestinal bleeding Outcome: Progressing   Problem: Fluid Volume: Goal: Will show no signs and symptoms of excessive bleeding Outcome: Progressing

## 2019-05-21 NOTE — Discharge Summary (Signed)
Physician Discharge Summary  Ellen [name] [id number] DOB: 1950/09/28 DOA: 05/16/2019  PCP: Nolene Ebbs, MD  Admit date: 05/16/2019 Discharge date: 05/21/2019  Admitted From: Home  Disposition:  Home  Recommendations for Outpatient Follow-up:  1. Follow up with PCP in 1-2 weeks 2. Please obtain BMP/CBC in one week 3. Follow up with GI, for further evaluation of GI bleed.   Home Health: none   Discharge Condition: Stable.  CODE STATUS: Full code Diet recommendation: Heart Healthy   Brief/Interim Summary: 69 year old with past medical history significant for diverticular disease, prior GI bleed from diverticulosis, history of breast cancer status post surgery and radiation, diabetes, hypertension, morbid obese, glaucoma who presents to the ED complaining of painless rectal bleeding.  Patient has been taking Aleve for pain.  Patient admitted with hematochezia.  Hemoglobin dropped to 5.  She received 2 unit of packed red blood cell.  Current hemoglobin around 10.   1-Presumed diverticular bleed, acute: And present with painless hematochezia. Hemoglobin drop down to 5 She received 2 unit of packed red blood cell Diet following and: Recommend continue with clear diet and monitor for further episode of GI bleed Hb stable. Had some redish stool./ was monitor overnight. No further bleeding. Hb stable.    2-acute blood loss anemia secondary to diverticular bleed: See #1 Hb stable.   Hypertension: Continue to hold blood pressure medication in the setting of GI bleed Advised to resume Norvasc if BP increases more than 130  Hyperlipidemia continue with statins  Type 2 diabetes: Continue to hold Metformin while inpatient: Sliding scale. Resume metformin at discharge.  Hypokalemia: resolved.   Discharge Diagnoses:  Principal Problem:   Rectal bleed Active Problems:   HTN (hypertension)   Hyperlipidemia   Obesity (BMI 30-39.9)   Breast cancer of upper-outer quadrant of  left female breast ([place])   GI bleed   Diabetes [facility])    Discharge Instructions  Discharge Instructions    Diet - low sodium heart healthy   Complete by: As directed    Increase activity slowly   Complete by: As directed      Allergies as of 05/21/2019      Reactions   Ace Inhibitors Swelling   Simvastatin Swelling   lips      Medication List    STOP taking these medications   amLODipine 10 MG tablet Commonly known as: NORVASC     TAKE these medications   calcium-vitamin D 500-200 MG-UNIT tablet Commonly known as: OSCAL WITH D Take 1 tablet by mouth 2 (two) times daily.   cholecalciferol 25 MCG (1000 UNIT) tablet Commonly known as: VITAMIN D3 Take 2,000 Units by mouth daily.   hydroxypropyl methylcellulose / hypromellose 2.5 % ophthalmic solution Commonly known as: ISOPTO TEARS / GONIOVISC Place 1 drop into both eyes 3 (three) times daily as needed for dry eyes.   latanoprost 0.005 % ophthalmic solution Commonly known as: XALATAN Place 1 drop into both eyes at bedtime.   Lumigan 0.01 % Soln Generic drug: bimatoprost Place 1 drop into both eyes at bedtime.   metFORMIN 500 MG tablet Commonly known as: GLUCOPHAGE Take 500 mg by mouth 2 (two) times daily.   pantoprazole 40 MG tablet Commonly known as: PROTONIX Take 1 tablet (40 mg total) by mouth daily.   pravastatin 40 MG tablet Commonly known as: PRAVACHOL Take 1 tablet (40 mg total) by mouth daily.      Follow-up Information    Irene Shipper, MD Follow up in 3  week(s).   Specialty: Gastroenterology Contact information: [address] [place] [address] [phone]        Nolene Ebbs, MD Follow up in 1 week(s).   Specialty: Internal Medicine Contact information: [place] [address] [phone]          Allergies  Allergen Reactions  . Ace Inhibitors Swelling  . Simvastatin Swelling    lips    Consultations: GI  Procedures/Studies: CT Angio  Abd/Pel w/ and/or w/o  Result Date: 05/17/2019 CLINICAL DATA:  GI bleed EXAM: CTA ABDOMEN AND PELVIS WITHOUT AND WITH CONTRAST TECHNIQUE: Multidetector CT imaging of the abdomen and pelvis was performed using the standard protocol during bolus administration of intravenous contrast. Multiplanar reconstructed images and MIPs were obtained and reviewed to evaluate the vascular anatomy. CONTRAST:  184mL OMNIPAQUE IOHEXOL 350 MG/ML SOLN COMPARISON:  None. FINDINGS: VASCULAR Aorta: Normal caliber aorta without aneurysm, dissection, vasculitis or significant stenosis. Celiac: Patent without evidence of aneurysm, dissection, vasculitis or significant stenosis. SMA: Patent without evidence of aneurysm, dissection, vasculitis or significant stenosis. Renals: Both renal arteries are patent without evidence of aneurysm, dissection, vasculitis, fibromuscular dysplasia or significant stenosis. IMA: Patent without evidence of aneurysm, dissection, vasculitis or significant stenosis. Inflow: Patent without evidence of aneurysm, dissection, vasculitis or significant stenosis. Proximal Outflow: Bilateral common femoral and visualized portions of the superficial and profunda femoral arteries are patent without evidence of aneurysm, dissection, vasculitis or significant stenosis. Veins: No obvious venous abnormality within the limitations of this arterial phase study. Review of the MIP images confirms the above findings. NON-VASCULAR Lower chest: Lung bases are clear. No effusions. Heart is normal size. Hepatobiliary: Low-density lesions within the liver are stable since prior study, most compatible with cysts. Gallbladder unremarkable. Pancreas: No focal abnormality or ductal dilatation. Spleen: No focal abnormality.  Normal size. Adrenals/Urinary Tract: No adrenal abnormality. No focal renal abnormality. No stones or hydronephrosis. Urinary bladder is unremarkable. Stomach/Bowel: Stomach, large and small bowel grossly  unremarkable. No visible active extravasation to localize GI bleed. Lymphatic: No adenopathy Reproductive: Prior hysterectomy.  No adnexal masses. Other: No free fluid or free air. Musculoskeletal: No acute bony abnormality. IMPRESSION: VASCULAR Infrarenal abdominal aortic atherosclerosis. No aneurysm or dissection. No active extravasation of contrast within bowel to localize GI bleed. NON-VASCULAR No acute findings in the abdomen or pelvis. Electronically Signed   By: Rolm Baptise M.D.   On: 05/17/2019 20:42     Subjective: Feeling better.  No bloody stool today   Discharge Exam: Vitals:   05/20/19 2209 05/21/19 0427  BP: (!) 158/83 121/89  Pulse: 64 65  Resp: 18 18  Temp: 98.8 F (37.1 C) 97.8 F (36.6 C)  SpO2: 99% 98%     General: Pt is alert, awake, not in acute distress Cardiovascular: RRR, S1/S2 +, no rubs, no gallops Respiratory: CTA bilaterally, no wheezing, no rhonchi Abdominal: Soft, NT, ND, bowel sounds + Extremities: no edema, no cyanosis    The results of significant diagnostics from this hospitalization (including imaging, microbiology, ancillary and laboratory) are listed below for reference.     Microbiology: Recent Results (from the past 240 hour(s))  SARS CORONAVIRUS 2 (TAT 6-24 HRS) Nasopharyngeal Nasopharyngeal Swab     Status: None   Collection Time: 05/16/19 11:14 PM   Specimen: Nasopharyngeal Swab  Result Value Ref Range Status   SARS Coronavirus 2 NEGATIVE NEGATIVE Final    Comment: (NOTE) SARS-CoV-2 target nucleic acids are NOT DETECTED. The SARS-CoV-2 RNA is generally detectable in upper  and lower respiratory specimens during the acute phase of infection. Negative results do not preclude SARS-CoV-2 infection, do not rule out co-infections with other pathogens, and should not be used as the sole basis for treatment or other patient management decisions. Negative results must be combined with clinical observations, patient history, and  epidemiological information. The expected result is Negative. Fact Sheet for Patients: SugarRoll.be Fact Sheet for Healthcare Providers: https://www.woods-mathews.com/ This test is not yet approved or cleared by the [place] FDA and  has been authorized for detection and/or diagnosis of SARS-CoV-2 by FDA under an Emergency Use Authorization (EUA). This EUA will remain  in effect (meaning this test can be used) for the duration of the COVID-19 declaration under Section 56 4(b)(1) of the Act, 21 U.S.C. section 360bbb-3(b)(1), unless the authorization is terminated or revoked sooner. Performed at [facility], [place] [address] [place], [place] [address]      Labs: BNP (last 3 results) No results for input(s): BNP in the last 8760 hours. Basic Metabolic Panel: Recent Labs  Lab 05/16/19 2056 05/17/19 0237 05/18/19 0254 05/19/19 0408 05/20/19 0431  NA 140 141 140 141 139  K 3.7 4.3 3.4* 3.6 3.9  CL 103 108 111 110 109  CO2 26 20* 23 24 24   GLUCOSE 127* 106* 135* 125* 102*  BUN 33* 41* 12 7* 7*  CREATININE 1.05* 0.84 0.69 0.65 0.85  CALCIUM 9.3 8.6* 8.0* 8.3* 8.8*   Liver Function Tests: Recent Labs  Lab 05/16/19 2056 05/17/19 0237  AST 17 14*  ALT 13 11  ALKPHOS 55 43  BILITOT 0.5 0.4  PROT 7.3 6.2*  ALBUMIN 4.1 3.6   No results for input(s): LIPASE, AMYLASE in the last 168 hours. No results for input(s): AMMONIA in the last 168 hours. CBC: Recent Labs  Lab 05/19/19 0408 05/19/19 1803 05/20/19 0431 05/20/19 1314 05/21/19 0442  WBC 4.9 5.3 7.1 9.4 7.2  HGB 11.5* 11.8* 11.9* 12.4 12.2  HCT 35.7* 36.7 36.6 38.5 37.7  MCV 85.6 86.2 84.5 84.8 86.5  PLT 264 263 273 248 283   Cardiac Enzymes: No results for input(s): CKTOTAL, CKMB, CKMBINDEX, TROPONINI in the last 168 hours. BNP: Invalid input(s): POCBNP CBG: Recent Labs  Lab 05/20/19 1204 05/20/19 1621 05/20/19 2205 05/21/19 0831 05/21/19 1100  GLUCAP  84 97 101* 88 128*   D-Dimer No results for input(s): DDIMER in the last 72 hours. Hgb A1c No results for input(s): HGBA1C in the last 72 hours. Lipid Profile No results for input(s): CHOL, HDL, LDLCALC, TRIG, CHOLHDL, LDLDIRECT in the last 72 hours. Thyroid function studies No results for input(s): TSH, T4TOTAL, T3FREE, THYROIDAB in the last 72 hours.  Invalid input(s): FREET3 Anemia work up No results for input(s): VITAMINB12, FOLATE, FERRITIN, TIBC, IRON, RETICCTPCT in the last 72 hours. Urinalysis    Component Value Date/Time   COLORURINE STRAW (A) 04/22/2017 1938   APPEARANCEUR CLEAR 04/22/2017 1938   LABSPEC 1.010 05/01/2017 1705   PHURINE 7.5 05/01/2017 1705   GLUCOSEU NEGATIVE 05/01/2017 1705   HGBUR TRACE (A) 05/01/2017 1705   BILIRUBINUR NEGATIVE 05/01/2017 1705   BILIRUBINUR Negative 05/30/2014 1508   KETONESUR NEGATIVE 05/01/2017 1705   PROTEINUR NEGATIVE 05/01/2017 1705   UROBILINOGEN 0.2 05/01/2017 1705   NITRITE NEGATIVE 05/01/2017 1705   LEUKOCYTESUR NEGATIVE 05/01/2017 1705   Sepsis Labs Invalid input(s): PROCALCITONIN,  WBC,  LACTICIDVEN Microbiology Recent Results (from the past 240 hour(s))  SARS CORONAVIRUS 2 (TAT 6-24 HRS) Nasopharyngeal Nasopharyngeal Swab  Status: None   Collection Time: 05/16/19 11:14 PM   Specimen: Nasopharyngeal Swab  Result Value Ref Range Status   SARS Coronavirus 2 NEGATIVE NEGATIVE Final    Comment: (NOTE) SARS-CoV-2 target nucleic acids are NOT DETECTED. The SARS-CoV-2 RNA is generally detectable in upper and lower respiratory specimens during the acute phase of infection. Negative results do not preclude SARS-CoV-2 infection, do not rule out co-infections with other pathogens, and should not be used as the sole basis for treatment or other patient management decisions. Negative results must be combined with clinical observations, patient history, and epidemiological information. The expected result is  Negative. Fact Sheet for Patients: SugarRoll.be Fact Sheet for Healthcare Providers: https://www.woods-mathews.com/ This test is not yet approved or cleared by the [place] FDA and  has been authorized for detection and/or diagnosis of SARS-CoV-2 by FDA under an Emergency Use Authorization (EUA). This EUA will remain  in effect (meaning this test can be used) for the duration of the COVID-19 declaration under Section 56 4(b)(1) of the Act, 21 U.S.C. section 360bbb-3(b)(1), unless the authorization is terminated or revoked sooner. Performed at [facility], [place] [address] [place], [place] [address]      Time coordinating discharge: 40 minutes  SIGNED:   Elmarie Shiley, MD  [facility]

## 2019-05-21 NOTE — Discharge Instructions (Signed)
Lower Gastrointestinal Bleeding  Lower gastrointestinal (GI) bleeding is the result of bleeding from the colon, rectum, or anal area. The colon is the last part of the digestive tract, where stool, also called feces, is formed. If you have lower GI bleeding, you may see blood in or on your stool. It may be bright red. Lower GI bleeding often stops without treatment. Continued or heavy bleeding needs emergency treatment at the hospital. What are the causes? Lower GI bleeding may be caused by:  A condition that causes pouches to form in the colon over time (diverticulosis).  Swelling and irritation (inflammation) in areas with diverticulosis (diverticulitis).  Inflammation of the colon (inflammatory bowel disease).  Swollen veins in the rectum (hemorrhoids).  Painful tears in the anus (anal fissures), often caused by passing hard stools.  Cancer of the colon or rectum.  Noncancerous growths (polyps) of the colon or rectum.  A bleeding disorder that impairs the formation of blood clots and causes easy bleeding (coagulopathy).  An abnormal weakening of a blood vessel where an artery and a vein come together (arteriovenous malformation). What increases the risk? You are more likely to develop this condition if:  You are older than 68 years of age.  You take aspirin or NSAIDs on a regular basis.  You take anticoagulant or antiplatelet drugs.  You have a history of high-dose X-ray treatment (radiation therapy) of the colon.  You recently had a colon polyp removed. What are the signs or symptoms? Symptoms of this condition include:  Bright red blood or blood clots coming from your rectum.  Bloody stools.  Black or maroon-colored stools.  Pain or cramping in the abdomen.  Weakness or dizziness.  Racing heartbeat. How is this diagnosed? This condition may be diagnosed based on:  Your symptoms and medical history.  A physical exam. During the exam, your health care  provider will check for signs of blood loss, such as low blood pressure and a rapid pulse.  Tests, such as: ? Flexible sigmoidoscopy. In this procedure, a flexible tube with a camera on the end is used to examine your anus and the first part of your colon to look for the source of bleeding. ? Colonoscopy. This is similar to a flexible sigmoidoscopy, but the camera can extend all the way to the uppermost part of your colon. ? Blood tests to measure your red blood cell count and to check for coagulopathy. ? An imaging study of your colon to look for a bleeding site. In some cases, you may have X-rays taken after a dye or radioactive substance is injected into your bloodstream (angiogram). How is this treated? Treatment for this condition depends on the cause of the bleeding. Heavy or persistent bleeding is treated at the hospital. Treatment may include:  Getting fluids through an IV tube inserted into one of your veins.  Getting blood through an IV tube (blood transfusion).  Stopping bleeding through high-heat coagulation, injections of certain medicines, or applying surgical clips. This can all be done during a colonoscopy.  Having a procedure that involves first doing an angiogram and then blocking blood flow to the bleeding site (embolization).  Stopping some of your regular medicines for a certain amount of time.  Having surgery to remove part of the colon. This may be needed if bleeding is severe and does not respond to other treatment. Follow these instructions at home:  Take over-the-counter and prescription medicines only as told by your health care provider. You may need to  avoid aspirin, NSAIDs, or other medicines that increase bleeding.  Eat foods that are high in fiber. This will help keep your stools soft. These foods include whole grains, legumes, fruits, and vegetables. Eating 1-3 prunes each day works well for many people.  Drink enough fluid to keep your urine clear or pale  yellow.  Keep all follow-up visits as told by your health care provider. This is important. Contact a health care provider if:  Your symptoms do not improve. Get help right away if:  Your bleeding increases.  You feel light-headed or you faint.  You feel weak.  You have severe cramps in your back or abdomen.  You pass large blood clots in your stool.  Your symptoms get worse. This information is not intended to replace advice given to you by your health care provider. Make sure you discuss any questions you have with your health care provider. Document Revised: 06/04/2018 Document Reviewed: 06/28/2015 Elsevier Patient Education  [place].

## 2019-05-31 ENCOUNTER — Ambulatory Visit: Payer: Medicare Other

## 2019-05-31 NOTE — Progress Notes (Signed)
   Covid-19 Vaccination Clinic  Name:  Ellen [name]    MRN: [id number] DOB: 11-09-1950  05/31/2019  Ellen [name] was observed post Covid-19 immunization for 30 minutes based on pre-vaccination screening without incident. She was provided with Vaccine Information Sheet and instruction to access the V-Safe system.   Ellen [name] was instructed to call 911 with any severe reactions post vaccine: Marland Kitchen Difficulty breathing  . Swelling of face and throat  . A fast heartbeat  . A bad rash all over body  . Dizziness and weakness   Immunizations Administered    Name Date Dose VIS Date Route   Pfizer COVID-19 Vaccine 05/31/2019 10:39 AM 0.3 mL 02/04/2019 Intramuscular   Manufacturer: [employer], [employer]   Lot: [id number]   [place]: [id number]

## 2019-06-20 ENCOUNTER — Other Ambulatory Visit: Payer: Self-pay

## 2019-06-22 ENCOUNTER — Ambulatory Visit: Payer: Medicare Other | Admitting: Gastroenterology

## 2019-06-22 ENCOUNTER — Encounter: Payer: Self-pay | Admitting: Gastroenterology

## 2019-06-22 VITALS — BP 140/76 | HR 72 | Temp 97.0°F | Ht 62.0 in | Wt 157.0 lb

## 2019-06-22 DIAGNOSIS — Z8719 Personal history of other diseases of the digestive system: Secondary | ICD-10-CM | POA: Diagnosis not present

## 2019-06-22 NOTE — Patient Instructions (Addendum)
If you are age 68 or older, your body mass index should be between 23-30. Your Body mass index is 28.72 kg/m. If this is out of the aforementioned range listed, please consider follow up with your Primary Care Provider.  If you are age 64 or younger, your body mass index should be between 19-25. Your Body mass index is 28.72 kg/m. If this is out of the aformentioned range listed, please consider follow up with your Primary Care Provider.   STOP: Aspirin 81mg    Please call our office with any recurrent bleeding that may occur.  Thank you for entrusting me with your care and choosing [facility].  Dr Ardis Hughs

## 2019-06-22 NOTE — Progress Notes (Signed)
Review of pertinent gastrointestinal problems: 1.  Personal  history of colonic polyps Colonoscopy 2007 three adenomatous polyps 2007; colonoscoipy 2010 no  polyps; colonoscopy 2015 two polps, not adenomatous.  Repeat colonoscopy June 2020 showed left-sided colon diverticulosis.  The colonoscopy was otherwise normal.  CT scans have shown diverticulosis throughout the colon. 2.  Acute lower GI bleeding felt likely to be lower source, likely diverticular.  CT angiogram September 2020 showed "focus of active extravasation in the proximal cecum" right sided diverticulosis noted again as well.  Recurrent lower GI bleeding March 2021 with another admission of overt bleeding.  She required 2 units of packed blood cells.  Again felt to be diverticular however no imaging tests confirmed active bleeding.  She underwent repeat CT angiogram March 2021 and it was negative   HPI: This is a very pleasant 69 year old woman whom I last saw about a year ago however she was hospitalized twice in the past 6 months for acute lower GI bleeding.  Her discharge hemoglobin was 12.2, her hemoglobin low was 5.2 and she only received 2 units.  I certainly do wonder about that value of 5.2 given her 7 g response with only 2 units of blood  Since discharge she has had no overt bleeding.  She has felt very well.  She takes a baby aspirin every day.  She takes this because a doctor sometime in her life told her it would be helpful to take to reduce her risk of colon cancer.  She has never been told to take this for cardiac or stroke related issues.  She has never had a heart attack or stroke or TIA.  ROS: complete GI ROS as described in HPI, all other review negative.  Constitutional:  No unintentional weight loss   Past Medical History:  Diagnosis Date  . Breast CA ([place]) 10/2007   S/P Surgery and Radiation Tx completed 03/20/08  . Diabetes ([place])   . Glaucoma   . HTN (hypertension)   . Hyperlipidemia   . Personal history  of radiation therapy 2009  . Rectal bleeding   . Thyroid goiter 03/2010   FNA of Left Thyroid Nodule - 05/14/2010    Past Surgical History:  Procedure Laterality Date  . ABDOMINAL HYSTERECTOMY  1986   Heavy bleeding  . BREAST BIOPSY  12/01/2007   Left axillary sentinal node bx : positive  . BREAST SURGERY  11/08/2007   Left partial mastectomy (lumpectomy)  . COLONOSCOPY  2010    Current Outpatient Medications  Medication Sig Dispense Refill  . amLODipine (NORVASC) 5 MG tablet Take 5 mg by mouth daily.    . calcium-vitamin D (OSCAL WITH D) 500-200 MG-UNIT per tablet Take 1 tablet by mouth 2 (two) times daily.    . cholecalciferol (VITAMIN D3) 25 MCG (1000 UT) tablet Take 2,000 Units by mouth daily.    . hydroxypropyl methylcellulose / hypromellose (ISOPTO TEARS / GONIOVISC) 2.5 % ophthalmic solution Place 1 drop into both eyes 3 (three) times daily as needed for dry eyes.    Marland Kitchen latanoprost (XALATAN) 0.005 % ophthalmic solution Place 1 drop into both eyes at bedtime.    Marland Kitchen LUMIGAN 0.01 % SOLN Place 1 drop into both eyes at bedtime.    . metFORMIN (GLUCOPHAGE) 500 MG tablet Take 500 mg by mouth 2 (two) times daily.  2  . pantoprazole (PROTONIX) 40 MG tablet Take 1 tablet (40 mg total) by mouth daily. 60 tablet 0  . pravastatin (PRAVACHOL) 40 MG tablet Take  1 tablet (40 mg total) by mouth daily. 90 tablet 1   No current facility-administered medications for this visit.    Allergies as of 06/22/2019 - Review Complete 06/22/2019  Allergen Reaction Noted  . Ace inhibitors Swelling 04/18/2010  . Simvastatin Swelling 04/03/2011    Family History  Problem Relation Age of Onset  . Diabetes Father   . Liver cancer Father        Died at 26   . Other Brother        gunshot wound  . Colon cancer Neg Hx   . Rectal cancer Neg Hx   . Stomach cancer Neg Hx   . Esophageal cancer Neg Hx     Social History   Socioeconomic History  . Marital status: Widowed    Spouse name: Not on file   . Number of children: 3  . Years of education: 11th  . Highest education level: Not on file  Occupational History  . Occupation: retired    Comment: Working 2 jobs.  Tobacco Use  . Smoking status: Former Smoker    Types: Cigarettes    Quit date: 02/25/2007    Years since quitting: 12.3  . Smokeless tobacco: Never Used  Substance and Sexual Activity  . Alcohol use: No  . Drug use: No  . Sexual activity: Not Currently  Other Topics Concern  . Not on file  Social History Narrative   Widowed. Education: [employer] - 11th grade.   Lives with her daughter and granddaughter.   2 cups caffeine per day.   Right-handed.   Social Determinants of Health   Financial Resource Strain:   . Difficulty of Paying Living Expenses:   Food Insecurity:   . Worried About Charity fundraiser in the Last Year:   . Arboriculturist in the Last Year:   Transportation Needs:   . Film/video editor (Medical):   Marland Kitchen Lack of Transportation (Non-Medical):   Physical Activity:   . Days of Exercise per Week:   . Minutes of Exercise per Session:   Stress:   . Feeling of Stress :   Social Connections:   . Frequency of Communication with Friends and Family:   . Frequency of Social Gatherings with Friends and Family:   . Attends Religious Services:   . Active Member of Clubs or Organizations:   . Attends Archivist Meetings:   Marland Kitchen Marital Status:   Intimate Partner Violence:   . Fear of Current or Ex-Partner:   . Emotionally Abused:   Marland Kitchen Physically Abused:   . Sexually Abused:      Physical Exam: BP 140/76   Pulse 72   Temp (!) 97 F (36.1 C)   Ht 5\' 2"  (1.575 m)   Wt 157 lb (71.2 kg)   BMI 28.72 kg/m  Constitutional: generally well-appearing Psychiatric: alert and oriented x3 Abdomen: soft, nontender, nondistended, no obvious ascites, no peritoneal signs, normal bowel sounds No peripheral edema noted in lower extremities  Assessment and plan: 69 y.o. female with recurrent  lower GI bleeds presumed diverticular  CT angiogram September 2020 showed cecal source.  Very likely right-sided colon diverticulosis however she also has left-sided colon diverticulosis which I have seen endoscopically.  Previously she was taking full-strength aspirin once daily to reduce the chance of colon cancer.  Colon cancer does not run in her family however.  She has never had stroke, TIA or heart attack.  She is very certain that she was  never told to take aspirin for cardiac or brain related issues.  She stopped taking full-strength several months ago and started taking a baby aspirin daily.  I certainly think it is best that she stop taking the baby aspirin as perhaps this may prevent future GI bleeding episodes.  She knows to call here if she has obvious lower GI bleeding again.  Please see the "Patient Instructions" section for addition details about the plan.  Owens Loffler, MD [facility] 06/22/2019, 9:43 AM   Total time on date of encounter was 25 minutes (this included time spent preparing to see the patient reviewing records; obtaining and/or reviewing separately obtained history; performing a medically appropriate exam and/or evaluation; counseling and educating the patient and family if present; ordering medications, tests or procedures if applicable; and documenting clinical information in the health record).

## 2019-09-13 ENCOUNTER — Other Ambulatory Visit: Payer: Self-pay

## 2019-09-13 ENCOUNTER — Encounter: Payer: Self-pay | Admitting: Podiatry

## 2019-09-13 ENCOUNTER — Ambulatory Visit: Payer: Medicare Other | Admitting: Podiatry

## 2019-09-13 DIAGNOSIS — B351 Tinea unguium: Secondary | ICD-10-CM

## 2019-09-13 DIAGNOSIS — L603 Nail dystrophy: Secondary | ICD-10-CM

## 2019-09-13 DIAGNOSIS — M79674 Pain in right toe(s): Secondary | ICD-10-CM

## 2019-09-13 MED ORDER — CEPHALEXIN 500 MG PO CAPS: 500.0000 mg | ORAL_CAPSULE | Freq: Three times a day (TID) | ORAL | 0 refills | Status: DC

## 2019-09-13 NOTE — Patient Instructions (Signed)

## 2019-09-14 NOTE — Progress Notes (Signed)
Subjective:   Patient ID: Ellen [name], female   DOB: 69 y.o.   MRN: [id number]   HPI 69 year old female presents the office for concerns of right toenail becoming thickened discolored she has noticed any drainage, clear coming from the nail.  She states that there is previously removed skin thickened discolored.  This was removed last year by Dr. Babs Bertin.  She has no other concerns today.   Review of Systems  All other systems reviewed and are negative.  Past Medical History:  Diagnosis Date  . Breast CA ([place]) 10/2007   S/P Surgery and Radiation Tx completed 03/20/08  . Diabetes ([place])   . Glaucoma   . HTN (hypertension)   . Hyperlipidemia   . Personal history of radiation therapy 2009  . Rectal bleeding   . Thyroid goiter 03/2010   FNA of Left Thyroid Nodule - 05/14/2010    Past Surgical History:  Procedure Laterality Date  . ABDOMINAL HYSTERECTOMY  1986   Heavy bleeding  . BREAST BIOPSY  12/01/2007   Left axillary sentinal node bx : positive  . BREAST SURGERY  11/08/2007   Left partial mastectomy (lumpectomy)  . COLONOSCOPY  2010     Current Outpatient Medications:  .  amLODipine (NORVASC) 5 MG tablet, Take 5 mg by mouth daily., Disp: , Rfl:  .  calcium-vitamin D (OSCAL WITH D) 500-200 MG-UNIT per tablet, Take 1 tablet by mouth 2 (two) times daily., Disp: , Rfl:  .  cephALEXin (KEFLEX) 500 MG capsule, Take 1 capsule (500 mg total) by mouth 3 (three) times daily., Disp: 21 capsule, Rfl: 0 .  cholecalciferol (VITAMIN D3) 25 MCG (1000 UT) tablet, Take 2,000 Units by mouth daily., Disp: , Rfl:  .  hydroxypropyl methylcellulose / hypromellose (ISOPTO TEARS / GONIOVISC) 2.5 % ophthalmic solution, Place 1 drop into both eyes 3 (three) times daily as needed for dry eyes., Disp: , Rfl:  .  latanoprost (XALATAN) 0.005 % ophthalmic solution, Place 1 drop into both eyes at bedtime., Disp: , Rfl:  .  LUMIGAN 0.01 % SOLN, Place 1 drop into both eyes at bedtime., Disp: , Rfl:  .   metFORMIN (GLUCOPHAGE) 500 MG tablet, Take 500 mg by mouth 2 (two) times daily., Disp: , Rfl: 2 .  pantoprazole (PROTONIX) 40 MG tablet, Take 1 tablet (40 mg total) by mouth daily., Disp: 60 tablet, Rfl: 0 .  pravastatin (PRAVACHOL) 40 MG tablet, Take 1 tablet (40 mg total) by mouth daily., Disp: 90 tablet, Rfl: 1  Allergies  Allergen Reactions  . Ace Inhibitors Swelling  . Simvastatin Swelling    lips         Objective:  Physical Exam  General: AAO x3, NAD  Dermatological: Right hallux toenail significantly got dystrophic, hypertrophic with yellow-brown discoloration, clear drainage present but no pus. There is no surrounding erythema, ascending cellulitis there is no fluctuance or crepitation there is no malodor.  The nail is loose Nailbed distally but primary care proximally.  Vascular: Dorsalis Pedis artery and Posterior Tibial artery pedal pulses are 2/4 bilateral with immedate capillary fill time.  There is no pain with calf compression, swelling, warmth, erythema.   Neruologic: Grossly intact via light touch bilateral.  Musculoskeletal: Tenderness to the right hallux toenail.  No other areas of discomfort.  Muscular strength 5/5 in all groups tested bilateral.  Gait: Unassisted, Nonantalgic.       Assessment:   69 year old female with right hallux onychodystrophy, onycholysis    Plan:  -Treatment options  discussed including all alternatives, risks, and complications -Etiology of symptoms were discussed -At this time, recommended total nail removal without chemical matricectomy to the right hallux toenail. Risks and complications were discussed with the patient for which they understand and  verbally consent to the procedure. Under sterile conditions a total of 3 mL of a mixture of 2% lidocaine plain and 0.5% Marcaine plain was infiltrated in a hallux block fashion. Once anesthetized, the skin was prepped in sterile fashion. A tourniquet was then applied. Next the right  hallux border was excised making sure to remove the entire offending nail border. Once the nail was  Removed, the area was debrided and the underlying skin was intact. The area was irrigated and hemostasis was obtained.  A dry sterile dressing was applied. After application of the dressing the tourniquet was removed and there is found to be an immediate capillary refill time to the digit. The patient tolerated the procedure well any complications. Post procedure instructions were discussed the patient for which he verbally understood. Follow-up in one week for nail check or sooner if any problems are to arise. Discussed signs/symptoms of worsening infection and directed to call the office immediately should any occur or go directly to the emergency room. In the meantime, encouraged to call the office with any questions, concerns, changes symptoms. -Nails sent for culture, pathology to [facility] and the specimen was given to [place] [facility], [place]. -Keflex    Return in about 2 weeks (around 09/27/2019).  Trula Slade DPM

## 2019-09-15 ENCOUNTER — Telehealth: Payer: Self-pay | Admitting: Podiatry

## 2019-09-15 NOTE — Telephone Encounter (Signed)
Pt called and wanted someone to call her back about her toe please assist

## 2019-09-15 NOTE — Telephone Encounter (Signed)
Pt states she had a toenail removed yesterday and has soaked twice and put on the neosporin and would like to know how long she needed to soak. I informed pt that she would need to soak at least 2-3 weeks, that at the end of the 3rd week, she could perform the last soak of the day and allow to air dry, if the area got a dry hard scab without redness, drainage, swelling or pain she could stop the soaks. Pt states understanding.

## 2019-09-20 ENCOUNTER — Other Ambulatory Visit: Payer: Self-pay | Admitting: Internal Medicine

## 2019-09-20 DIAGNOSIS — Z1231 Encounter for screening mammogram for malignant neoplasm of breast: Secondary | ICD-10-CM

## 2019-09-23 ENCOUNTER — Telehealth: Payer: Self-pay | Admitting: *Deleted

## 2019-09-23 NOTE — Telephone Encounter (Signed)
Left message informing pt Dr. Jacqualyn Posey had reviewed her test results and would discuss at the 09/27/2019 8:15am appt.

## 2019-09-23 NOTE — Telephone Encounter (Signed)
-----   Message from Trula Slade, DPM sent at 09/20/2019  8:01 AM EDT ----- Val- please let her know that the culture did not show fungus but damage. We can discuss further treatment options for the nail when I see her back for the nail check.

## 2019-09-27 ENCOUNTER — Ambulatory Visit: Payer: Medicare Other | Admitting: Podiatry

## 2019-09-27 ENCOUNTER — Other Ambulatory Visit: Payer: Self-pay

## 2019-09-27 DIAGNOSIS — L603 Nail dystrophy: Secondary | ICD-10-CM

## 2019-09-27 NOTE — Patient Instructions (Signed)

## 2019-09-27 NOTE — Progress Notes (Signed)
Subjective: 69 year old female presents the office for follow-up evaluation after having right total nail avulsion.  She also presents to discuss nail biopsy results.  She had some clear drainage but no pus.  No pain currently denies any swelling or redness.  She has no concerns otherwise today. Denies any systemic complaints such as fevers, chills, nausea, vomiting. No acute changes since last appointment, and no other complaints at this time.   Objective: AAO x3, NAD DP/PT pulses palpable bilaterally, CRT less than 3 seconds Status post total nail avulsion of the right hallux and mild granular tissue is present with some macerated tissue.  There is no drainage or pus identified today there is no edema, erythema decreasing cellulitis.  There is no areas of fluctuation crepitation. No pain with calf compression, swelling, warmth, erythema  Assessment: Status post total nail avulsion right hallux, onychodystrophy  Plan: -All treatment options discussed with the patient including all alternatives, risks, complications.  -Recommend continue soaking Epson salt soaks twice a day, antibiotic ointment and a bandage.  Keep the area open at night.  If not healed in 2 weeks let me know or sooner if there is any issues.  Monitor for any signs or symptoms of infection. -Review the nail biopsy results.  It does show findings consistent with microtrauma.  There is no fungal or melanin pigment identified.  Discussed some of the nail growth again and discussed the growing thicker discolored we will likely do a compound cream through [facility] or urea cream. -Patient encouraged to call the office with any questions, concerns, change in symptoms.   Trula Slade DPM

## 2019-10-07 ENCOUNTER — Telehealth: Payer: Self-pay | Admitting: *Deleted

## 2019-10-07 NOTE — Telephone Encounter (Signed)
Pt states she has some questions about her toe.

## 2019-10-07 NOTE — Telephone Encounter (Signed)
Left a message for pt to call 10/10/2019 concerning the toes and ask Dr. Leigh Aurora assistant.

## 2019-10-10 ENCOUNTER — Telehealth: Payer: Self-pay | Admitting: Hematology and Oncology

## 2019-10-10 NOTE — Telephone Encounter (Signed)
[id number] Faxed medical records to [facility] @ fax [phone]

## 2019-10-11 NOTE — Telephone Encounter (Signed)
Called and left message for the patient that I was returning the patient's call. Ellen [name]

## 2019-11-01 ENCOUNTER — Other Ambulatory Visit: Payer: Self-pay

## 2019-11-01 ENCOUNTER — Ambulatory Visit
Admission: RE | Admit: 2019-11-01 | Discharge: 2019-11-01 | Disposition: A | Discharge status: Home / Self Care | Payer: Medicare Other | Source: Ambulatory Visit | Attending: Internal Medicine | Admitting: Internal Medicine

## 2019-11-01 DIAGNOSIS — Z1231 Encounter for screening mammogram for malignant neoplasm of breast: Secondary | ICD-10-CM

## 2020-09-26 ENCOUNTER — Other Ambulatory Visit: Payer: Self-pay | Admitting: Internal Medicine

## 2020-09-26 DIAGNOSIS — Z1231 Encounter for screening mammogram for malignant neoplasm of breast: Secondary | ICD-10-CM

## 2020-11-15 ENCOUNTER — Ambulatory Visit: Payer: Medicare Other

## 2020-12-17 ENCOUNTER — Other Ambulatory Visit: Payer: Self-pay

## 2020-12-17 ENCOUNTER — Ambulatory Visit
Admission: RE | Admit: 2020-12-17 | Discharge: 2020-12-17 | Disposition: A | Discharge status: Home / Self Care | Payer: Medicare Other | Source: Ambulatory Visit | Attending: Internal Medicine | Admitting: Internal Medicine

## 2020-12-17 DIAGNOSIS — Z1231 Encounter for screening mammogram for malignant neoplasm of breast: Secondary | ICD-10-CM

## 2021-03-16 ENCOUNTER — Ambulatory Visit
Admission: EM | Admit: 2021-03-16 | Discharge: 2021-03-16 | Disposition: A | Discharge status: Home / Self Care | Payer: Medicare Other | Attending: Physician Assistant | Admitting: Physician Assistant

## 2021-03-16 ENCOUNTER — Other Ambulatory Visit: Payer: Self-pay

## 2021-03-16 DIAGNOSIS — R1030 Lower abdominal pain, unspecified: Secondary | ICD-10-CM

## 2021-03-16 DIAGNOSIS — M545 Low back pain, unspecified: Secondary | ICD-10-CM | POA: Diagnosis not present

## 2021-03-16 LAB — POCT URINALYSIS DIP (MANUAL ENTRY)
Bilirubin, UA: NEGATIVE
Glucose, UA: NEGATIVE mg/dL
Ketones, POC UA: NEGATIVE mg/dL
Leukocytes, UA: NEGATIVE
Nitrite, UA: NEGATIVE
Protein Ur, POC: NEGATIVE mg/dL
Spec Grav, UA: 1.01 (ref 1.010–1.025)
Urobilinogen, UA: 0.2 E.U./dL
pH, UA: 7 (ref 5.0–8.0)

## 2021-03-16 MED ORDER — TIZANIDINE HCL 4 MG PO TABS: 4.0000 mg | ORAL_TABLET | Freq: Four times a day (QID) | ORAL | 0 refills | Status: AC | PRN

## 2021-03-16 NOTE — ED Provider Notes (Signed)
[facility]    CSN: [id number] Arrival date & time: 03/16/21  1234      History   Chief Complaint Chief Complaint  Patient presents with   Back Pain    lumbar    HPI Ellen [name] is a 71 y.o. female.   Patient here today for evaluation of lower back pain and lower abdominal pain that has waxed and waned over the last week. She reports she does feel a tightness in her back at times. She does not feel constipated as she takes miralax daily and has been having regular bowel movements. She denies any blood in her stool. She has not had any nausea or vomiting. She does not report other treatment for symptoms.  The history is provided by the patient.  Back Pain Associated symptoms: abdominal pain   Associated symptoms: no fever    Past Medical History:  Diagnosis Date   Breast CA ([place]) 10/2007   S/P Surgery and Radiation Tx completed 03/20/08   Diabetes ([place])    Glaucoma    HTN (hypertension)    Hyperlipidemia    Personal history of radiation therapy 2009   Rectal bleeding    Thyroid goiter 03/2010   FNA of Left Thyroid Nodule - 05/14/2010    Patient Active Problem List   Diagnosis Date Noted   Diabetes ([place]) 05/16/2019   Diverticulosis of colon with hemorrhage    Acute blood loss anemia    Rectal bleed 11/20/2018   Hypokalemia 11/20/2018   Hemarthrosis of left knee 11/06/2017   Left low back pain 07/14/2017   GI bleed 05/09/2017   Malignant neoplasm of overlapping sites of left female breast ([place]) 06/29/2015   Dry eye syndrome 06/01/2014   Eye disease 06/01/2014   Goiter 04/28/2013   History of tobacco use 11/05/2011   Breast cancer of upper-outer quadrant of left female breast ([place]) 11/04/2011   Obesity (BMI 30-39.9) 04/03/2011   HTN (hypertension)    COLONIC POLYPS, ADENOMATOUS, HX OF 05/02/2008   Hyperlipidemia 12/11/2003    Class: Chronic    Past Surgical History:  Procedure Laterality Date   ABDOMINAL HYSTERECTOMY  1986   Heavy bleeding    BREAST BIOPSY  12/01/2007   Left axillary sentinal node bx : positive   BREAST LUMPECTOMY Left 2009   BREAST SURGERY  11/08/2007   Left partial mastectomy (lumpectomy)   COLONOSCOPY  2010    OB History   No obstetric history on file.      Home Medications    Prior to Admission medications   Medication Sig Start Date End Date Taking? Authorizing Provider  tiZANidine (ZANAFLEX) 4 MG tablet Take 1 tablet (4 mg total) by mouth every 6 (six) hours as needed for muscle spasms. 03/16/21  Yes Francene Finders, PA-C  amLODipine (NORVASC) 5 MG tablet Take 5 mg by mouth daily. 05/26/19   [provider]  calcium-vitamin D (OSCAL WITH D) 500-200 MG-UNIT per tablet Take 1 tablet by mouth 2 (two) times daily.    [provider]  cephALEXin (KEFLEX) 500 MG capsule Take 1 capsule (500 mg total) by mouth 3 (three) times daily. 09/13/19   Trula Slade, DPM  cholecalciferol (VITAMIN D3) 25 MCG (1000 UT) tablet Take 2,000 Units by mouth daily.    [provider]  hydroxypropyl methylcellulose / hypromellose (ISOPTO TEARS / GONIOVISC) 2.5 % ophthalmic solution Place 1 drop into both eyes 3 (three) times daily as needed for dry eyes.    [provider]  latanoprost (XALATAN) 0.005 % ophthalmic solution Place 1 drop into both eyes at bedtime. 04/06/19   [provider]  LUMIGAN 0.01 % SOLN Place 1 drop into both eyes at bedtime. 04/06/19   [provider]  metFORMIN (GLUCOPHAGE) 500 MG tablet Take 500 mg by mouth 2 (two) times daily. 03/24/17   [provider]  pantoprazole (PROTONIX) 40 MG tablet Take 1 tablet (40 mg total) by mouth daily. 05/21/19   Regalado, Belkys A, MD  pravastatin (PRAVACHOL) 40 MG tablet Take 1 tablet (40 mg total) by mouth daily. 12/13/15   Elby Beck, FNP    Family History Family History  Problem Relation Age of Onset   Diabetes Father    Liver cancer Father        Died at 14    Other Brother         gunshot wound   Colon cancer Neg Hx    Rectal cancer Neg Hx    Stomach cancer Neg Hx    Esophageal cancer Neg Hx     Social History Social History   Tobacco Use   Smoking status: Former    Types: Cigarettes    Quit date: 02/25/2007    Years since quitting: 14.0   Smokeless tobacco: Never  Vaping Use   Vaping Use: Never used  Substance Use Topics   Alcohol use: No   Drug use: No     Allergies   Ace inhibitors and Simvastatin   Review of Systems Review of Systems  Constitutional:  Negative for chills and fever.  Eyes:  Negative for discharge and redness.  Gastrointestinal:  Positive for abdominal pain. Negative for blood in stool, constipation, diarrhea, nausea and vomiting.  Musculoskeletal:  Positive for back pain and myalgias.    Physical Exam Triage Vital Signs ED Triage Vitals  Enc Vitals Group     BP 03/16/21 1308 (!) 148/80     Pulse Rate 03/16/21 1308 80     Resp 03/16/21 1308 18     Temp 03/16/21 1308 98.4 F (36.9 C)     Temp Source 03/16/21 1308 Oral     SpO2 03/16/21 1308 98 %     Weight --      Height --      Head Circumference --      Peak Flow --      Pain Score 03/16/21 1311 4     Pain Loc --      Pain Edu? --      Excl. in [place]? --    No data found.  Updated Vital Signs BP (!) 148/80 (BP Location: Right Arm)    Pulse 80    Temp 98.4 F (36.9 C) (Oral)    Resp 18    SpO2 98%      Physical Exam Vitals and nursing note reviewed.  Constitutional:      General: She is not in acute distress.    Appearance: Normal appearance. She is not ill-appearing.  HENT:     Head: Normocephalic and atraumatic.  Eyes:     Conjunctiva/sclera: Conjunctivae normal.  Cardiovascular:     Rate and Rhythm: Normal rate and regular rhythm.     Heart sounds: Normal heart sounds. No murmur heard. Pulmonary:     Effort: Pulmonary effort is normal. No respiratory distress.     Breath sounds: Normal breath sounds. No wheezing, rhonchi or rales.  Abdominal:      General: Abdomen is flat. There  is no distension.     Palpations: Abdomen is soft.     Tenderness: There is no abdominal tenderness. There is no guarding or rebound.  Musculoskeletal:     Comments: No TTP to midline low back or across low back bilaterally  Neurological:     Mental Status: She is alert.  Psychiatric:        Mood and Affect: Mood normal.        Behavior: Behavior normal.        Thought Content: Thought content normal.     UC Treatments / Results  Labs (all labs ordered are listed, but only abnormal results are displayed) Labs Reviewed  POCT URINALYSIS DIP (MANUAL ENTRY) - Abnormal; Notable for the following components:      Result Value   Blood, UA trace-intact (*)    All other components within normal limits    EKG   Radiology No results found.  Procedures Procedures (including critical care time)  Medications Ordered in UC Medications - No data to display  Initial Impression / Assessment and Plan / UC Course  I have reviewed the triage vital signs and the nursing notes.  Pertinent labs & imaging results that were available during my care of the patient were reviewed by me and considered in my medical decision making (see chart for details).    Unknown etiology of symptoms. Will treat to cover possible strain as patient does question if this is the cause of her symptoms. Recommended follow up with her PCP if symptoms persist or worsen as she may need imaging or labs.   Final Clinical Impressions(s) / UC Diagnoses   Final diagnoses:  Lower abdominal pain  Acute bilateral low back pain without sciatica   Discharge Instructions   None    ED Prescriptions     Medication Sig Dispense Auth. Provider   tiZANidine (ZANAFLEX) 4 MG tablet Take 1 tablet (4 mg total) by mouth every 6 (six) hours as needed for muscle spasms. 30 tablet Francene Finders, PA-C      PDMP not reviewed this encounter.   Francene Finders, PA-C 03/16/21 1345

## 2021-03-16 NOTE — ED Triage Notes (Signed)
One week h/o low back with some tightness that radiates to her low abdomen bilaterally. Soreness with lumbar extension. No meds taken.

## 2021-05-30 ENCOUNTER — Ambulatory Visit: Payer: Medicare Other | Admitting: Podiatry

## 2021-05-30 ENCOUNTER — Other Ambulatory Visit: Payer: Self-pay | Admitting: Internal Medicine

## 2021-05-30 DIAGNOSIS — L603 Nail dystrophy: Secondary | ICD-10-CM | POA: Diagnosis not present

## 2021-05-30 DIAGNOSIS — L03039 Cellulitis of unspecified toe: Secondary | ICD-10-CM

## 2021-05-30 MED ORDER — CEPHALEXIN 500 MG PO CAPS: 500.0000 mg | ORAL_CAPSULE | Freq: Three times a day (TID) | ORAL | 0 refills | Status: DC

## 2021-05-30 MED ORDER — MUPIROCIN 2 % EX OINT: 1.0000 "application " | TOPICAL_OINTMENT | Freq: Two times a day (BID) | CUTANEOUS | 2 refills | Status: AC

## 2021-05-30 NOTE — Patient Instructions (Signed)

## 2021-05-31 LAB — COMPLETE METABOLIC PANEL WITH GFR
AG Ratio: 1.4 (calc) (ref 1.0–2.5)
ALT: 9 U/L (ref 6–29)
AST: 11 U/L (ref 10–35)
Albumin: 4 g/dL (ref 3.6–5.1)
Alkaline phosphatase (APISO): 47 U/L (ref 37–153)
BUN: 18 mg/dL (ref 7–25)
CO2: 23 mmol/L (ref 20–32)
Calcium: 9.3 mg/dL (ref 8.6–10.4)
Chloride: 105 mmol/L (ref 98–110)
Creat: 0.71 mg/dL (ref 0.50–1.05)
Globulin: 2.9 g/dL (calc) (ref 1.9–3.7)
Glucose, Bld: 86 mg/dL (ref 65–99)
Potassium: 4.4 mmol/L (ref 3.5–5.3)
Sodium: 139 mmol/L (ref 135–146)
Total Bilirubin: 0.3 mg/dL (ref 0.2–1.2)
Total Protein: 6.9 g/dL (ref 6.1–8.1)
eGFR: 92 mL/min/{1.73_m2} (ref >=60)

## 2021-05-31 LAB — LIPID PANEL
Cholesterol: 158 mg/dL (ref <200)
HDL: 52 mg/dL (ref >=50)
LDL Cholesterol (Calc): 91 mg/dL (calc)
Non-HDL Cholesterol (Calc): 106 mg/dL (calc) (ref <130)
Total CHOL/HDL Ratio: 3 (calc) (ref <5.0)
Triglycerides: 60 mg/dL (ref <150)

## 2021-05-31 LAB — CBC
HCT: 37.9 % (ref 35.0–45.0)
Hemoglobin: 12.1 g/dL (ref 11.7–15.5)
MCH: 28.1 pg (ref 27.0–33.0)
MCHC: 31.9 g/dL — ABNORMAL LOW (ref 32.0–36.0)
MCV: 88.1 fL (ref 80.0–100.0)
MPV: 9.5 fL (ref 7.5–12.5)
Platelets: 378 10*3/uL (ref 140–400)
RBC: 4.3 10*6/uL (ref 3.80–5.10)
RDW: 13 % (ref 11.0–15.0)
WBC: 4 10*3/uL (ref 3.8–10.8)

## 2021-05-31 LAB — T4, FREE: Free T4: 1.2 ng/dL (ref 0.8–1.8)

## 2021-05-31 LAB — TSH: TSH: 1.23 mIU/L (ref 0.40–4.50)

## 2021-05-31 LAB — VITAMIN D 25 HYDROXY (VIT D DEFICIENCY, FRACTURES): Vit D, 25-Hydroxy: 46 ng/mL (ref 30–100)

## 2021-06-01 NOTE — Progress Notes (Signed)
Subjective: ?71 year old female presents the office today for concerns of drainage that came underneath her right big toenail.  On Saturday she had a pedicure and on some days when she pressed on the nail she had some drainage.  She went to the area checked.  No swelling redness or any pain associated with the nail.  She states that she been doing well with the nail since I last saw her until this started.  No fevers or chills or other concerns today. ? ?Objective: ?AAO x3, NAD ?DP/PT pulses palpable bilaterally, CRT less than 3 seconds ?Right hallux nail is hypertrophic, dystrophic.  Initially upon palpation small minimal amount of clear drainage from the distal portion of the nail.  Is able to debride the distal portion of the nail as well as remove the gel nail polish.  After I debrided this there is no further drainage or pus.  There is no pain on exam.  There is no edema, erythema or signs of infection otherwise. ?No pain with calf compression, swelling, warmth, erythema ? ?Assessment: ?Minimal drainage right hallux toenail ? ?Plan: ?-All treatment options discussed with the patient including all alternatives, risks, complications.  ?-Sharply debrided the nail without any complications or bleeding.  Given the small mount of drainage prescribed cephalexin.  Recommend Epsom salt soaks daily.  Monitor closely for any signs or symptoms of worsening infection or pain.  Should symptoms continue or worsen may need to proceed with nail avulsion.  She will go to monitor this closely. ?-Patient encouraged to call the office with any questions, concerns, change in symptoms.  ? ?Trula Slade DPM ? ?

## 2021-08-23 ENCOUNTER — Ambulatory Visit: Payer: Medicare Other | Admitting: Podiatry

## 2021-08-23 DIAGNOSIS — L603 Nail dystrophy: Secondary | ICD-10-CM

## 2021-08-29 NOTE — Progress Notes (Signed)
Subjective:  Patient ID: Ellen [name], female    DOB: [birth year],  MRN: [id number]  Chief Complaint  Patient presents with   Nail Problem    Nail has drainage     71 y.o. female presents with the above complaint.  Patient presents with complaint left hallux nail dystrophy.  Patient feels some drainage coming out of it no pain per se however the nail is very loose and dystrophic she would like to have removed.  Pain scale is 1 out of 10.  She is worried that it got caught under something.  She is worried about infection she has not seen anyone else prior to seeing me.  She denies any other acute complaints.   Review of Systems: Negative except as noted in the HPI. Denies N/V/F/Ch.  Past Medical History:  Diagnosis Date   Breast CA ([place]) 10/2007   S/P Surgery and Radiation Tx completed 03/20/08   Diabetes ([place])    Glaucoma    HTN (hypertension)    Hyperlipidemia    Personal history of radiation therapy 2009   Rectal bleeding    Thyroid goiter 03/2010   FNA of Left Thyroid Nodule - 05/14/2010    Current Outpatient Medications:    amLODipine (NORVASC) 5 MG tablet, Take 5 mg by mouth daily., Disp: , Rfl:    calcium-vitamin D (OSCAL WITH D) 500-200 MG-UNIT per tablet, Take 1 tablet by mouth 2 (two) times daily., Disp: , Rfl:    cephALEXin (KEFLEX) 500 MG capsule, Take 1 capsule (500 mg total) by mouth 3 (three) times daily., Disp: 21 capsule, Rfl: 0   cephALEXin (KEFLEX) 500 MG capsule, Take 1 capsule (500 mg total) by mouth 3 (three) times daily., Disp: 21 capsule, Rfl: 0   cholecalciferol (VITAMIN D3) 25 MCG (1000 UT) tablet, Take 2,000 Units by mouth daily., Disp: , Rfl:    hydroxypropyl methylcellulose / hypromellose (ISOPTO TEARS / GONIOVISC) 2.5 % ophthalmic solution, Place 1 drop into both eyes 3 (three) times daily as needed for dry eyes., Disp: , Rfl:    latanoprost (XALATAN) 0.005 % ophthalmic solution, Place 1 drop into both eyes at bedtime., Disp: , Rfl:    LUMIGAN 0.01 %  SOLN, Place 1 drop into both eyes at bedtime., Disp: , Rfl:    metFORMIN (GLUCOPHAGE) 500 MG tablet, Take 500 mg by mouth 2 (two) times daily., Disp: , Rfl: 2   mupirocin ointment (BACTROBAN) 2 %, Apply 1 application. topically 2 (two) times daily., Disp: 30 g, Rfl: 2   pantoprazole (PROTONIX) 40 MG tablet, Take 1 tablet (40 mg total) by mouth daily., Disp: 60 tablet, Rfl: 0   pravastatin (PRAVACHOL) 40 MG tablet, Take 1 tablet (40 mg total) by mouth daily., Disp: 90 tablet, Rfl: 1   tiZANidine (ZANAFLEX) 4 MG tablet, Take 1 tablet (4 mg total) by mouth every 6 (six) hours as needed for muscle spasms., Disp: 30 tablet, Rfl: 0  Social History   Tobacco Use  Smoking Status Former   Types: Cigarettes   Quit date: 02/25/2007   Years since quitting: 14.5  Smokeless Tobacco Never    Allergies  Allergen Reactions   Ace Inhibitors Swelling   Simvastatin Swelling    lips   Objective:  There were no vitals filed for this visit. There is no height or weight on file to calculate BMI. Constitutional Well developed. Well nourished.  Vascular Dorsalis pedis pulses palpable bilaterally. Posterior tibial pulses palpable bilaterally. Capillary refill normal to all digits.  No  cyanosis or clubbing noted. Pedal hair growth normal.  Neurologic Normal speech. Oriented to person, place, and time. Epicritic sensation to light touch grossly present bilaterally.  Dermatologic Pain on palpation of the entire/total nail on 1st digit of the left No other open wounds. No skin lesions.  Orthopedic: Normal joint ROM without pain or crepitus bilaterally. No visible deformities. No bony tenderness.   Radiographs: None Assessment:   1. Nail dystrophy    Plan:  Patient was evaluated and treated and all questions answered.  Nail contusion/dystrophy hallux, left -Patient elects to proceed with minor surgery to remove entire toenail today. Consent reviewed and signed by patient. -Entire/total nail  excised. See procedure note. -Educated on post-procedure care including soaking. Written instructions provided and reviewed. -Patient to follow up in 2 weeks for nail check.  Procedure: Excision of entire/total nail  Location: Left 1st toe digit Anesthesia: Lidocaine 1% plain; 1.5 mL and Marcaine 0.5% plain; 1.5 mL, digital block. Skin Prep: Betadine. Dressing: Silvadene; telfa; dry, sterile, compression dressing. Technique: Following skin prep, the toe was exsanguinated and a tourniquet was secured at the base of the toe. The affected nail border was freed and excised. The tourniquet was then removed and sterile dressing applied. Disposition: Patient tolerated procedure well. Patient to return in 2 weeks for follow-up.   No follow-ups on file.

## 2021-11-18 ENCOUNTER — Other Ambulatory Visit: Payer: Self-pay | Admitting: Internal Medicine

## 2021-11-18 DIAGNOSIS — Z1231 Encounter for screening mammogram for malignant neoplasm of breast: Secondary | ICD-10-CM

## 2021-12-19 ENCOUNTER — Ambulatory Visit: Payer: Medicare Other

## 2022-02-13 ENCOUNTER — Ambulatory Visit
Admission: RE | Admit: 2022-02-13 | Discharge: 2022-02-13 | Disposition: A | Discharge status: Home / Self Care | Payer: Medicare Other | Source: Ambulatory Visit | Attending: Internal Medicine | Admitting: Internal Medicine

## 2022-02-13 DIAGNOSIS — Z1231 Encounter for screening mammogram for malignant neoplasm of breast: Secondary | ICD-10-CM

## 2022-07-10 ENCOUNTER — Other Ambulatory Visit: Payer: Self-pay | Admitting: Internal Medicine

## 2022-07-11 LAB — LIPID PANEL
Cholesterol: 134 mg/dL (ref <200)
HDL: 45 mg/dL — ABNORMAL LOW (ref >=50)
LDL Cholesterol (Calc): 73 mg/dL (calc)
Non-HDL Cholesterol (Calc): 89 mg/dL (calc) (ref <130)
Total CHOL/HDL Ratio: 3 (calc) (ref <5.0)
Triglycerides: 84 mg/dL (ref <150)

## 2022-07-11 LAB — CBC
HCT: 36 % (ref 35.0–45.0)
Hemoglobin: 11.5 g/dL — ABNORMAL LOW (ref 11.7–15.5)
MCH: 27.3 pg (ref 27.0–33.0)
MCHC: 31.9 g/dL — ABNORMAL LOW (ref 32.0–36.0)
MCV: 85.3 fL (ref 80.0–100.0)
MPV: 9.5 fL (ref 7.5–12.5)
Platelets: 375 10*3/uL (ref 140–400)
RBC: 4.22 10*6/uL (ref 3.80–5.10)
RDW: 13 % (ref 11.0–15.0)
WBC: 10.7 10*3/uL (ref 3.8–10.8)

## 2022-07-11 LAB — COMPLETE METABOLIC PANEL WITH GFR
AG Ratio: 1.1 (calc) (ref 1.0–2.5)
ALT: 10 U/L (ref 6–29)
AST: 11 U/L (ref 10–35)
Albumin: 3.8 g/dL (ref 3.6–5.1)
Alkaline phosphatase (APISO): 61 U/L (ref 37–153)
BUN/Creatinine Ratio: 22 (calc) (ref 6–22)
BUN: 13 mg/dL (ref 7–25)
CO2: 23 mmol/L (ref 20–32)
Calcium: 8.9 mg/dL (ref 8.6–10.4)
Chloride: 104 mmol/L (ref 98–110)
Creat: 0.59 mg/dL — ABNORMAL LOW (ref 0.60–1.00)
Globulin: 3.5 g/dL (calc) (ref 1.9–3.7)
Glucose, Bld: 93 mg/dL (ref 65–99)
Potassium: 3.9 mmol/L (ref 3.5–5.3)
Sodium: 138 mmol/L (ref 135–146)
Total Bilirubin: 0.3 mg/dL (ref 0.2–1.2)
Total Protein: 7.3 g/dL (ref 6.1–8.1)
eGFR: 97 mL/min/{1.73_m2} (ref >=60)

## 2022-07-11 LAB — TSH: TSH: 0.73 mIU/L (ref 0.40–4.50)

## 2022-07-11 LAB — VITAMIN D 25 HYDROXY (VIT D DEFICIENCY, FRACTURES): Vit D, 25-Hydroxy: 53 ng/mL (ref 30–100)

## 2022-12-26 ENCOUNTER — Other Ambulatory Visit: Payer: Self-pay | Admitting: Internal Medicine

## 2022-12-26 DIAGNOSIS — Z1231 Encounter for screening mammogram for malignant neoplasm of breast: Secondary | ICD-10-CM

## 2023-02-16 ENCOUNTER — Ambulatory Visit
Admission: RE | Admit: 2023-02-16 | Discharge: 2023-02-16 | Disposition: A | Discharge status: Home / Self Care | Payer: Medicare Other | Source: Ambulatory Visit | Attending: Internal Medicine | Admitting: Internal Medicine

## 2023-02-16 DIAGNOSIS — Z1231 Encounter for screening mammogram for malignant neoplasm of breast: Secondary | ICD-10-CM

## 2023-04-11 ENCOUNTER — Emergency Department (HOSPITAL_COMMUNITY)
Admission: EM | Admit: 2023-04-11 | Discharge: 2023-04-11 | Disposition: A | Discharge status: Home / Self Care | Payer: Medicare Other | Attending: Emergency Medicine | Admitting: Emergency Medicine

## 2023-04-11 ENCOUNTER — Other Ambulatory Visit: Payer: Self-pay

## 2023-04-11 ENCOUNTER — Encounter (HOSPITAL_COMMUNITY): Payer: Self-pay | Admitting: *Deleted

## 2023-04-11 ENCOUNTER — Emergency Department (HOSPITAL_COMMUNITY): Payer: Medicare Other

## 2023-04-11 DIAGNOSIS — E119 Type 2 diabetes mellitus without complications: Secondary | ICD-10-CM | POA: Diagnosis not present

## 2023-04-11 DIAGNOSIS — Z79899 Other long term (current) drug therapy: Secondary | ICD-10-CM | POA: Insufficient documentation

## 2023-04-11 DIAGNOSIS — I10 Essential (primary) hypertension: Secondary | ICD-10-CM | POA: Diagnosis not present

## 2023-04-11 DIAGNOSIS — Z853 Personal history of malignant neoplasm of breast: Secondary | ICD-10-CM | POA: Diagnosis not present

## 2023-04-11 DIAGNOSIS — M160 Bilateral primary osteoarthritis of hip: Secondary | ICD-10-CM | POA: Diagnosis not present

## 2023-04-11 DIAGNOSIS — M25551 Pain in right hip: Secondary | ICD-10-CM | POA: Diagnosis present

## 2023-04-11 MED ORDER — DICLOFENAC SODIUM 1 % EX GEL: 2.0000 g | Freq: Four times a day (QID) | CUTANEOUS | 0 refills | Status: AC

## 2023-04-11 MED ORDER — ACETAMINOPHEN 500 MG PO TABS: 1000.0000 mg | ORAL_TABLET | Freq: Four times a day (QID) | ORAL | 0 refills | Status: AC | PRN

## 2023-04-11 MED ORDER — ACETAMINOPHEN 500 MG PO TABS
1000.0000 mg | ORAL_TABLET | Freq: Once | ORAL | Status: AC
  Administered 2023-04-11: 1000 mg via ORAL
  Filled 2023-04-11: qty 2

## 2023-04-11 NOTE — ED Provider Triage Note (Signed)
 Emergency Medicine Provider Triage Evaluation Note  Ellen [name] , a 73 y.o. female  was evaluated in triage.  Pt complains of hip pain. Atraumatic pain to R hip x 3 days, radiates down to R leg.  Felt a pop when she turns upon initial stasrt of the pain.  Able to bear weight.  No treatment tried.  No red flags  Review of Systems  Positive: As above Negative: As above  Physical Exam  BP (!) 170/95 (BP Location: Left Arm)   Pulse 78   Temp 98.7 F (37.1 C) (Oral)   Resp 16   Wt 69.9 kg   SpO2 99%   BMI 28.17 kg/m  Gen:   Awake, no distress   Resp:  Normal effort  MSK:   Moves extremities without difficulty  Other:    Medical Decision Making  Medically screening exam initiated at 3:11 PM.  Appropriate orders placed.  Ellen [name] was informed that the remainder of the evaluation will be completed by another provider, this initial triage assessment does not replace that evaluation, and the importance of remaining in the ED until their evaluation is complete.     Fayrene Helper, PA-C 04/11/23 1512

## 2023-04-11 NOTE — Discharge Instructions (Addendum)
 You have been evaluated for your hip pain.  Fortunately x-ray did not show any broken bone or dislocation but it does appear that you have osteoarthritis causing your discomfort.  Please apply Voltaren gel to the affected area up to 4 times daily as needed for pain control.  Take Tylenol as needed for pain and follow-up closely with your doctor for further care.  Return if you have any concern.

## 2023-04-11 NOTE — ED Provider Notes (Signed)
 [facility] DEPARTMENT AT [facility] Provider Note   CSN: [id number] Arrival date & time: 04/11/23  1447     History  Chief Complaint  Patient presents with   Hip Pain    Ellen [name] is a 73 y.o. female.  The history is provided by the patient and medical records. No language interpreter was used.  Hip Pain     73 year old female with history of breast cancer, hypertension, diabetes, thyroid goiter presenting with complaint of hip pain.  Patient report 3 days ago she was twisting to the side and felt a pop follows with pain to her right hip.  Pain radiates to her groin and down her leg worse with movement.  She still able to ambulate.  She is without any other complaint.  She denies any back pain no fever or chills no bowel bladder incontinence or saddle anesthesia no rash.  Home Medications Prior to Admission medications   Medication Sig Start Date End Date Taking? Authorizing Provider  amLODipine (NORVASC) 5 MG tablet Take 5 mg by mouth daily. 05/26/19   [provider]  calcium-vitamin D (OSCAL WITH D) 500-200 MG-UNIT per tablet Take 1 tablet by mouth 2 (two) times daily.    [provider]  cephALEXin (KEFLEX) 500 MG capsule Take 1 capsule (500 mg total) by mouth 3 (three) times daily. 09/13/19   Vivi Barrack, DPM  cephALEXin (KEFLEX) 500 MG capsule Take 1 capsule (500 mg total) by mouth 3 (three) times daily. 05/30/21   Vivi Barrack, DPM  cholecalciferol (VITAMIN D3) 25 MCG (1000 UT) tablet Take 2,000 Units by mouth daily.    [provider]  hydroxypropyl methylcellulose / hypromellose (ISOPTO TEARS / GONIOVISC) 2.5 % ophthalmic solution Place 1 drop into both eyes 3 (three) times daily as needed for dry eyes.    [provider]  latanoprost (XALATAN) 0.005 % ophthalmic solution Place 1 drop into both eyes at bedtime. 04/06/19   [provider]  LUMIGAN 0.01 % SOLN Place 1 drop into both eyes at bedtime.  04/06/19   [provider]  metFORMIN (GLUCOPHAGE) 500 MG tablet Take 500 mg by mouth 2 (two) times daily. 03/24/17   [provider]  mupirocin ointment (BACTROBAN) 2 % Apply 1 application. topically 2 (two) times daily. 05/30/21   Vivi Barrack, DPM  pantoprazole (PROTONIX) 40 MG tablet Take 1 tablet (40 mg total) by mouth daily. 05/21/19   Regalado, Belkys A, MD  pravastatin (PRAVACHOL) 40 MG tablet Take 1 tablet (40 mg total) by mouth daily. 12/13/15   Emi Belfast, FNP  tiZANidine (ZANAFLEX) 4 MG tablet Take 1 tablet (4 mg total) by mouth every 6 (six) hours as needed for muscle spasms. 03/16/21   Tomi Bamberger, PA-C      Allergies    Ace inhibitors and Simvastatin    Review of Systems   Review of Systems  Constitutional:  Negative for fever.  Skin:  Negative for wound.    Physical Exam Updated Vital Signs BP (!) 170/95 (BP Location: Left Arm)   Pulse 78   Temp 98.7 F (37.1 C) (Oral)   Resp 16   Wt 69.9 kg   SpO2 99%   BMI 28.17 kg/m  Physical Exam Vitals and nursing note reviewed.  Constitutional:      General: She is not in acute distress.    Appearance: She is well-developed. She is obese.  HENT:     Head: Atraumatic.  Eyes:     Conjunctiva/sclera: Conjunctivae normal.  Pulmonary:     Effort: Pulmonary effort is normal.  Musculoskeletal:        General: Tenderness (Right hip: Tenderness to the lateral hip as well as right inguinal region with full range of motion about the hip and no deformity noted.  Negative straight leg raise.) present.     Cervical back: Neck supple.     Comments: No midline spine tenderness.  Skin:    Capillary Refill: Capillary refill takes less than 2 seconds.     Findings: No rash.  Neurological:     Mental Status: She is alert.  Psychiatric:        Mood and Affect: Mood normal.     ED Results / Procedures / Treatments   Labs (all labs ordered are listed, but only abnormal results are displayed) Labs  Reviewed - No data to display  EKG None  Radiology DG Hip Unilat W or Wo Pelvis 2-3 Views Right Result Date: 04/11/2023 CLINICAL DATA:  Right hip/groin pain. Patient felt a pop on Wednesday and has pain ever since. EXAM: DG HIP (WITH OR WITHOUT PELVIS) 2-3V RIGHT COMPARISON:  CT 05/17/2019 FINDINGS: Bilateral hip osteoarthritis is noted, moderate. There is medial joint space narrowing with subchondral sclerosis and marginal spur formation. IMPRESSION: 1. No acute findings. 2. Moderate bilateral hip osteoarthritis. Electronically Signed   By: Signa Kell M.D.   On: 04/11/2023 16:13    Procedures Procedures    Medications Ordered in ED Medications  acetaminophen (TYLENOL) tablet 1,000 mg (1,000 mg Oral Given 04/11/23 1615)    ED Course/ Medical Decision Making/ A&P                                 Medical Decision Making Amount and/or Complexity of Data Reviewed Radiology: ordered.  Risk OTC drugs.   BP (!) 170/95 (BP Location: Left Arm)   Pulse 78   Temp 98.7 F (37.1 C) (Oral)   Resp 16   Wt 69.9 kg   SpO2 99%   BMI 28.17 kg/m   66:94 PM  73 year old female with history of breast cancer, hypertension, diabetes, thyroid goiter presenting with complaint of hip pain.  Patient report 3 days ago she was twisting to the side and felt a pop follows with pain to her right hip.  Pain radiates to her groin and down her leg worse with movement.  She still able to ambulate.  She is without any other complaint.  She denies any back pain no fever or chills no bowel bladder incontinence or saddle anesthesia no rash.  Exam notable for tenderness to lateral right hip palpable range of motion is normal.  Patient able to bear weight.  Negative straight leg raise.  No significant tenderness to right knee or right ankle.  Patient is neurovascular intact.  Vital signs notable for elevated blood pressure of 170/95.  Patient is afebrile no hypoxia.  X-ray of right hip and pelvis obtained  independently viewed interpreted by me without any acute finding.  Evidence of osteoarthritis with noted bilaterally.  Agree with radiology interpretation.  After Tylenol patient felt better.  She is able to ambulate.  I have low suspicion for occult fracture.  Will discharge home with Tylenol and Voltaren gel for symptom control.  Doubt cauda equina and doubt spinal fracture.        Final Clinical Impression(s) / ED Diagnoses Final diagnoses:  Right hip pain  Osteoarthritis of both hips, unspecified osteoarthritis type    Rx / DC Orders ED Discharge Orders          Ordered    acetaminophen (TYLENOL) 500 MG tablet  Every 6 hours PRN        04/11/23 1733    diclofenac Sodium (VOLTAREN) 1 % GEL  4 times daily        04/11/23 1733              Fayrene Helper, PA-C 04/11/23 1735    Lonell Grandchild, MD 04/11/23 [phone]

## 2023-04-11 NOTE — ED Triage Notes (Signed)
 Here by POV from home for R hip pain. Felt a pop Wednesday and has had pain ever since. Ambulatory. Pinpoints pain to mediathigh/ groin, outer thigh/ hip and down leg. Denies other sx. Rates 9/10. No meds PTA. Alert, NAD, calm.

## 2023-06-26 ENCOUNTER — Ambulatory Visit: Admitting: Podiatry

## 2023-06-26 DIAGNOSIS — L603 Nail dystrophy: Secondary | ICD-10-CM

## 2023-06-26 NOTE — Progress Notes (Unsigned)
 Subjective:  Patient ID: Ellen [name], female    DOB: Jul 12, 1950,  MRN: [id number]  No chief complaint on file.   73 y.o. female presents with the above complaint.  Patient presents with right hallux nail dystrophy.  It hurts with ambulation as her pressure she would like to have it removed she does not want make it permanent she is a diabetic with last A1c of 6.0.  She denies any other acute complaints.  Pain scale is 5 out of 10 dull aching nature   Review of Systems: Negative except as noted in the HPI. Denies N/V/F/Ch.  Past Medical History:  Diagnosis Date   Breast CA ([facility]) 10/2007   S/P Surgery and Radiation Tx completed 03/20/08   Diabetes ([facility])    Glaucoma    HTN (hypertension)    Hyperlipidemia    Personal history of radiation therapy 2009   Rectal bleeding    Thyroid  goiter 03/2010   FNA of Left Thyroid  Nodule - 05/14/2010    Current Outpatient Medications:    acetaminophen  (TYLENOL ) 500 MG tablet, Take 2 tablets (1,000 mg total) by mouth every 6 (six) hours as needed for mild pain (pain score 1-3) or moderate pain (pain score 4-6)., Disp: 30 tablet, Rfl: 0   amLODipine  (NORVASC ) 5 MG tablet, Take 5 mg by mouth daily., Disp: , Rfl:    calcium-vitamin D  (OSCAL WITH D) 500-200 MG-UNIT per tablet, Take 1 tablet by mouth 2 (two) times daily., Disp: , Rfl:    cholecalciferol (VITAMIN D3) 25 MCG (1000 UT) tablet, Take 2,000 Units by mouth daily., Disp: , Rfl:    diclofenac  Sodium (VOLTAREN ) 1 % GEL, Apply 2 g topically 4 (four) times daily., Disp: 200 g, Rfl: 0   hydroxypropyl methylcellulose / hypromellose (ISOPTO TEARS / GONIOVISC) 2.5 % ophthalmic solution, Place 1 drop into both eyes 3 (three) times daily as needed for dry eyes., Disp: , Rfl:    latanoprost (XALATAN) 0.005 % ophthalmic solution, Place 1 drop into both eyes at bedtime., Disp: , Rfl:    LUMIGAN 0.01 % SOLN, Place 1 drop into both eyes at bedtime., Disp: , Rfl:    metFORMIN  (GLUCOPHAGE ) 500 MG tablet, Take  500 mg by mouth 2 (two) times daily., Disp: , Rfl: 2   mupirocin  ointment (BACTROBAN ) 2 %, Apply 1 application. topically 2 (two) times daily., Disp: 30 g, Rfl: 2   pantoprazole  (PROTONIX ) 40 MG tablet, Take 1 tablet (40 mg total) by mouth daily., Disp: 60 tablet, Rfl: 0   pravastatin  (PRAVACHOL ) 40 MG tablet, Take 1 tablet (40 mg total) by mouth daily., Disp: 90 tablet, Rfl: 1   tiZANidine  (ZANAFLEX ) 4 MG tablet, Take 1 tablet (4 mg total) by mouth every 6 (six) hours as needed for muscle spasms., Disp: 30 tablet, Rfl: 0  Social History   Tobacco Use  Smoking Status Former   Current packs/day: 0.00   Types: Cigarettes   Quit date: 02/25/2007   Years since quitting: 16.3  Smokeless Tobacco Never    Allergies  Allergen Reactions   Ace Inhibitors Swelling   Simvastatin Swelling    lips   Objective:  There were no vitals filed for this visit. There is no height or weight on file to calculate BMI. Constitutional Well developed. Well nourished.  Vascular Dorsalis pedis pulses palpable bilaterally. Posterior tibial pulses palpable bilaterally. Capillary refill normal to all digits.  No cyanosis or clubbing noted. Pedal hair growth normal.  Neurologic Normal speech. Oriented to person, place, and  time. Epicritic sensation to light touch grossly present bilaterally.  Dermatologic Pain on palpation of the entire/total nail on 1st digit of the right No other open wounds. No skin lesions.  Orthopedic: Normal joint ROM without pain or crepitus bilaterally. No visible deformities. No bony tenderness.   Radiographs: None Assessment:   1. Nail dystrophy    Plan:  Patient was evaluated and treated and all questions answered.  Nail contusion/dystrophy  hallux, right -Patient elects to proceed with minor surgery to remove entire toenail today. Consent reviewed and signed by patient. -Entire/total nail excised. See procedure note. -Educated on post-procedure care including soaking.  Written instructions provided and reviewed. -Patient to follow up in 2 weeks for nail check.  Procedure: Excision of entire/total nail  Location: Right 1st toe digit Anesthesia: Lidocaine  1% plain; 1.5 mL and Marcaine 0.5% plain; 1.5 mL, digital block. Skin Prep: Betadine. Dressing: Silvadene; telfa; dry, sterile, compression dressing. Technique: Following skin prep, the toe was exsanguinated and a tourniquet was secured at the base of the toe. The affected nail border was freed and excised. The tourniquet was then removed and sterile dressing applied. Disposition: Patient tolerated procedure well. Patient to return in 2 weeks for follow-up.   No follow-ups on file.

## 2023-07-01 ENCOUNTER — Telehealth: Payer: Self-pay | Admitting: Podiatry

## 2023-07-01 NOTE — Telephone Encounter (Signed)
 Pt requests a letter to release her back to work. Are we able to provide this?

## 2023-07-02 ENCOUNTER — Encounter: Payer: Self-pay | Admitting: Podiatry

## 2023-07-15 ENCOUNTER — Ambulatory Visit: Admitting: Podiatry

## 2023-07-15 DIAGNOSIS — L603 Nail dystrophy: Secondary | ICD-10-CM | POA: Diagnosis not present

## 2023-07-15 NOTE — Progress Notes (Signed)
 Subjective: Ellen [name] is a 73 y.o.  female returns to office today for follow up evaluation after having right 1st total nail avulsion performed. Patient has been soaking using epsom salt and applying topical antibiotic covered with bandaid daily. Patient denies fevers, chills, nausea, vomiting. Denies any calf pain, chest pain, SOB.   Objective:  Vitals: Reviewed  General: Well developed, nourished, in no acute distress, alert and oriented x3   Dermatology: Skin is warm, dry and supple bilateral. right 1st nail bed appears to be clean, dry, with mild granular tissue and surrounding scab. There is no surrounding erythema, edema, drainage/purulence. The remaining nails appear unremarkable at this time. There are no other lesions or other signs of infection present.  Neurovascular status: Intact. No lower extremity swelling; No pain with calf compression bilateral.  Musculoskeletal: Decreased tenderness to palpation of the right 1st nail bed. Muscular strength within normal limits bilateral.   Assesement and Plan: S/p total nail avulsion, doing well.   -DisContinue soaking in epsom salts twice a day followed by antibiotic ointment and a band-aid. Can leave uncovered at night. Continue this until completely healed.  -If the area has not healed in 2 weeks, call the office for follow-up appointment, or sooner if any problems arise.  -Monitor for any signs/symptoms of infection. Call the office immediately if any occur or go directly to the emergency room. Call with any questions/concerns.  Makaylie Dedeaux, DPM

## 2023-11-28 ENCOUNTER — Ambulatory Visit
Admission: EM | Admit: 2023-11-28 | Discharge: 2023-11-28 | Disposition: A | Discharge status: Home / Self Care | Attending: Family Medicine | Admitting: Family Medicine

## 2023-11-28 DIAGNOSIS — L290 Pruritus ani: Secondary | ICD-10-CM

## 2023-11-28 DIAGNOSIS — K602 Anal fissure, unspecified: Secondary | ICD-10-CM

## 2023-11-28 MED ORDER — HYDROCORTISONE (PERIANAL) 2.5 % EX CREA
1.0000 | TOPICAL_CREAM | Freq: Two times a day (BID) | CUTANEOUS | 0 refills | Status: AC | PRN
Start: 2023-11-28 — End: ?

## 2023-11-28 MED ORDER — LIDOCAINE 5 % EX OINT
1.0000 | TOPICAL_OINTMENT | Freq: Three times a day (TID) | CUTANEOUS | 0 refills | Status: AC | PRN
Start: 2023-11-28 — End: ?

## 2023-11-28 NOTE — Discharge Instructions (Addendum)
 You may use hydrocortisone topical rectal cream twice daily and the lidocaine  topical ointment 3 times a day as needed to the rectal area.  To the tape test.  Placing a patient Scotch tape over the rectum at night and then look at it in the morning.  If you notice any worms on a please contact her PCP she may need treatment for pinworms.  You can also do sitz bath's and high-fiber diets as well.  Please follow-up with your PCP in 2 to 3 days for recheck.  Please go to the ER for any worsening symptoms.  Hope you feel better soon exclamation

## 2023-11-28 NOTE — ED Provider Notes (Signed)
 [facility]    CSN: [id number] Arrival date & time: 11/28/23  9062      History   Chief Complaint Chief Complaint  Patient presents with   Anal Itching    HPI Ellen [name] is a 73 y.o. female presents for anal itching.  Patient reports 2 weeks of persistent anal itching that is worse at night.  She denies any injury to the area.  No diarrhea or constipation.  No pain.  Denies having a history of this in the past.  States she called her PCP who sent her in a cream that she thinks was triamcinolone which she states it has not helped at all.  No exposure to anyone with similar symptoms.  No other concerns at this time  HPI  Past Medical History:  Diagnosis Date   Breast CA (HCC) 10/2007   S/P Surgery and Radiation Tx completed 03/20/08   Diabetes (HCC)    Glaucoma    HTN (hypertension)    Hyperlipidemia    Personal history of radiation therapy 2009   Rectal bleeding    Thyroid  goiter 03/2010   FNA of Left Thyroid  Nodule - 05/14/2010    Patient Active Problem List   Diagnosis Date Noted   Diabetes (HCC) 05/16/2019   Diverticulosis of colon with hemorrhage    Acute blood loss anemia    Rectal bleed 11/20/2018   Hypokalemia 11/20/2018   Hemarthrosis of left knee 11/06/2017   Left low back pain 07/14/2017   GI bleed 05/09/2017   Malignant neoplasm of overlapping sites of left female breast (HCC) 06/29/2015   Dry eye syndrome 06/01/2014   Eye disease 06/01/2014   Goiter 04/28/2013   History of tobacco use 11/05/2011   Breast cancer of upper-outer quadrant of left 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      Home Medications    Prior to Admission medications   Medication Sig Start Date End Date Taking? Authorizing Provider  hydrocortisone (ANUSOL-HC) 2.5 % rectal cream Place 1 Application rectally 2 (two) times daily as needed for anal itching. 11/28/23  Yes Roston Grunewald, Jodi R, NP  lidocaine  (XYLOCAINE ) 5 % ointment Apply 1 Application topically 3 (three) times daily as needed (anal pain/itching). 11/28/23  Yes Bastian Andreoli, Jodi R, NP  acetaminophen  (TYLENOL ) 500 MG tablet Take 2 tablets (1,000 mg total) by mouth every 6 (six) hours as needed for mild pain (pain score 1-3) or moderate pain (pain score 4-6). 04/11/23   Nivia Colon, PA-C  amLODipine  (NORVASC ) 5 MG tablet Take 5 mg by mouth daily. 05/26/19   [provider]  calcium-vitamin D  (OSCAL WITH D) 500-200 MG-UNIT per tablet Take 1 tablet by mouth 2 (two) times daily.    [provider]  cholecalciferol (VITAMIN D3) 25 MCG (1000 UT) tablet Take 2,000 Units by mouth daily.    [provider]  diclofenac  Sodium (VOLTAREN ) 1 % GEL Apply 2 g topically 4 (four) times  daily. 04/11/23   Nivia Colon, PA-C  hydroxypropyl methylcellulose / hypromellose (ISOPTO TEARS / GONIOVISC) 2.5 % ophthalmic solution Place 1 drop into both eyes 3 (three) times daily as needed for dry eyes.    [provider]  latanoprost (XALATAN) 0.005 % ophthalmic solution Place 1 drop into both eyes at bedtime. 04/06/19   [provider]  LUMIGAN 0.01 % SOLN Place 1 drop into both eyes at bedtime. 04/06/19   [provider]  metFORMIN  (GLUCOPHAGE ) 500 MG tablet Take 500 mg by mouth 2 (two) times daily. 03/24/17   [provider]  mupirocin  ointment (BACTROBAN ) 2 % Apply 1 application. topically 2 (two) times daily. 05/30/21   Gershon Donnice SAUNDERS, DPM  pantoprazole  (PROTONIX ) 40 MG tablet Take 1 tablet (40 mg total)  by mouth daily. 05/21/19   Regalado, Belkys A, MD  pravastatin  (PRAVACHOL ) 40 MG tablet Take 1 tablet (40 mg total) by mouth daily. 12/13/15   Avram Barnie NOVAK, FNP  tiZANidine  (ZANAFLEX ) 4 MG tablet Take 1 tablet (4 mg total) by mouth every 6 (six) hours as needed for muscle spasms. 03/16/21   Billy Asberry FALCON, PA-C    Family History Family History  Problem Relation Age of Onset   Diabetes Father    Liver cancer Father        Died at 24    Other Brother        gunshot wound   Colon cancer Neg Hx    Rectal cancer Neg Hx    Stomach cancer Neg Hx    Esophageal cancer Neg Hx     Social History Social History   Tobacco Use   Smoking status: Former    Current packs/day: 0.00    Types: Cigarettes    Quit date: 02/25/2007    Years since quitting: 16.7   Smokeless tobacco: Never  Vaping Use   Vaping status: Never Used  Substance Use Topics   Alcohol  use: No   Drug use: No     Allergies   Ace inhibitors and Simvastatin   Review of Systems Review of Systems  Gastrointestinal:        Anal itching     Physical Exam Triage Vital Signs ED Triage Vitals  Encounter Vitals Group     BP 11/28/23 1009 (!) 161/90     Girls Systolic BP Percentile --      Girls Diastolic BP Percentile --      Boys Systolic BP Percentile --      Boys Diastolic BP Percentile --      Pulse Rate 11/28/23 1009 60     Resp 11/28/23 1009 15     Temp 11/28/23 1009 98.4 F (36.9 C)     Temp Source 11/28/23 1009 Oral     SpO2 11/28/23 1009 98 %     Weight --      Height --      Head Circumference --      Peak Flow --      Pain Score 11/28/23 1008 0     Pain Loc --      Pain Education --      Exclude from Growth Chart --    No data found.  Updated Vital Signs BP (!) 161/90   Pulse 60   Temp 98.4 F (36.9 C) (Oral)   Resp 15   SpO2 98%   Visual Acuity Right Eye Distance:   Left Eye Distance:   Bilateral Distance:    Right Eye  Near:   Left Eye Near:    Bilateral Near:      Physical Exam Vitals and nursing note reviewed. Exam conducted with a chaperone present Editor, commissioning).  Constitutional:      General: She is not in acute distress.    Appearance: Normal appearance. She is not ill-appearing.  HENT:     Head: Normocephalic and atraumatic.  Eyes:     Pupils: Pupils are equal, round, and reactive to light.  Cardiovascular:     Rate and Rhythm: Normal rate.  Pulmonary:     Effort: Pulmonary effort is normal.  Genitourinary:     Comments: There is a small anal fissure to the 5 o'clock position.  There is no external hemorrhoids.  There is no swelling or bleeding.  Nontender to palpation.  No visible worms. Skin:    General: Skin is warm and dry.  Neurological:     General: No focal deficit present.     Mental Status: She is alert and oriented to person, place, and time.  Psychiatric:        Mood and Affect: Mood normal.        Behavior: Behavior normal.      UC Treatments / Results  Labs (all labs ordered are listed, but only abnormal results are displayed) Labs Reviewed - No data to display  EKG   Radiology No results found.  Procedures Procedures (including critical care time)  Medications Ordered in UC Medications - No data to display  Initial Impression / Assessment and Plan / UC Course  I have reviewed the triage vital signs and the nursing notes.  Pertinent labs & imaging results that were available during my care of the patient were reviewed by me and considered in my medical decision making (see chart for details).     Reviewed exam and symptoms with patient.  Discussed anal fissure.  Will do topical lidocaine  and hydrocortisone rectal cream.  No sign of pinworms on exam but as there are worsen I did advise her to do that tape test and to call her PCP if she notices anything on the tape in the morning.  Advised sitz bath's and high-fiber diet.  ER precautions reviewed. Final Clinical Impressions(s) / UC Diagnoses   Final  diagnoses:  Anal fissure  Anal itching     Discharge Instructions      You may use hydrocortisone topical rectal cream twice daily and the lidocaine  topical ointment 3 times a day as needed to the rectal area.  To the tape test.  Placing a patient Scotch tape over the rectum at night and then look at it in the morning.  If you notice any worms on a please contact her PCP she may need treatment for pinworms.  You can also do sitz bath's and high-fiber diets as well.  Please follow-up with your PCP in 2 to 3 days for recheck.  Please go to the ER for any worsening symptoms.  Hope you feel better soon exclamation     ED Prescriptions     Medication Sig Dispense Auth. Provider   lidocaine  (XYLOCAINE ) 5 % ointment Apply 1 Application topically 3 (three) times daily as needed (anal pain/itching). 30 g Elzena Muston, Jodi R, NP   hydrocortisone (ANUSOL-HC) 2.5 % rectal cream Place 1 Application rectally 2 (two) times daily as needed for anal itching. 30 g Renessa Wellnitz, Jodi R, NP      PDMP not reviewed this encounter.   Loreda Myla SAUNDERS, NP 11/28/23 1049

## 2023-11-28 NOTE — ED Triage Notes (Signed)
 Pt present with rectal itching. Pt states she was prescribed cream,she did not have any relief. Pt denies pain. Pt states she feels a bump. States it has been going on for weeks.

## 2024-01-15 ENCOUNTER — Other Ambulatory Visit: Payer: Self-pay | Admitting: Internal Medicine

## 2024-01-15 DIAGNOSIS — Z1231 Encounter for screening mammogram for malignant neoplasm of breast: Secondary | ICD-10-CM

## 2024-02-11 ENCOUNTER — Ambulatory Visit (INDEPENDENT_AMBULATORY_CARE_PROVIDER_SITE_OTHER)

## 2024-02-11 ENCOUNTER — Ambulatory Visit
Admission: EM | Admit: 2024-02-11 | Discharge: 2024-02-11 | Disposition: A | Discharge status: Home / Self Care | Attending: Student | Admitting: Student

## 2024-02-11 DIAGNOSIS — M109 Gout, unspecified: Secondary | ICD-10-CM

## 2024-02-11 DIAGNOSIS — M7989 Other specified soft tissue disorders: Secondary | ICD-10-CM

## 2024-02-11 MED ORDER — PREDNISONE 20 MG PO TABS: 40.0000 mg | ORAL_TABLET | Freq: Every day | ORAL | 0 refills | Status: AC

## 2024-02-11 NOTE — ED Triage Notes (Signed)
 Pt present with lt hand swelling  x 3 days.   States her hand felt sore on Monday and the swelling started Tuesday. 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   Home interventions: none

## 2024-02-11 NOTE — ED Provider Notes (Signed)
 [facility]    CSN: [id number] Arrival date & time: 02/11/24  9160      History   Chief Complaint Chief Complaint  Patient presents with   Hand Swelling    HPI Ellen [name] is a 73 y.o. female presenting w L hand swelling.    Pt present with lt hand swelling  x 3 days.    States her hand felt sore on Monday and the swelling started Tuesday. 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   No prior history of gout, though she did eat red meat 1 day ago.   Home interventions: none    HPI  Past Medical History:  Diagnosis Date   Breast CA (HCC) 10/2007   S/P Surgery and Radiation Tx completed 03/20/08   Diabetes (HCC)    Glaucoma    HTN (hypertension)    Hyperlipidemia    Personal history of radiation therapy 2009   Rectal bleeding    Thyroid  goiter 03/2010   FNA of Left Thyroid  Nodule - 05/14/2010    Patient Active Problem List   Diagnosis Date Noted   Diabetes (HCC) 05/16/2019   Diverticulosis of colon with hemorrhage    Acute blood loss anemia    Rectal bleed 11/20/2018   Hypokalemia 11/20/2018   Hemarthrosis of left knee 11/06/2017   Left low back pain 07/14/2017   GI bleed 05/09/2017   Malignant neoplasm of overlapping sites of left female breast (HCC) 06/29/2015   Dry eye syndrome 06/01/2014   Eye disease 06/01/2014   Goiter 04/28/2013   History of tobacco use 11/05/2011   Breast cancer of upper-outer quadrant of left female breast (HCC) 11/04/2011   Obesity (BMI 30-39.9) 04/03/2011   HTN (hypertension)    History of colonic polyps 05/02/2008   Hyperlipidemia 12/11/2003    Class: Chronic    Past Surgical History:  Procedure Laterality Date   ABDOMINAL HYSTERECTOMY  1986   Heavy bleeding   BREAST BIOPSY  12/01/2007   Left axillary sentinal node bx : positive   BREAST LUMPECTOMY Left 2009   BREAST SURGERY  11/08/2007   Left partial mastectomy (lumpectomy)   COLONOSCOPY  2010     OB History   No obstetric history on file.      Home Medications    Prior to Admission medications  Medication Sig Start Date End Date Taking? Authorizing Provider  acetaminophen  (TYLENOL ) 500 MG tablet Take 2 tablets (1,000 mg total) by mouth every 6 (six) hours as needed for mild pain (pain score 1-3) or moderate pain (pain score 4-6). 04/11/23  Yes Nivia Colon, PA-C  amLODipine  (NORVASC ) 5 MG tablet Take 5 mg by mouth daily. 05/26/19  Yes [provider]  calcium-vitamin D  (OSCAL WITH D) 500-200 MG-UNIT per tablet Take 1 tablet by mouth 2 (two) times daily.   Yes [provider]  cholecalciferol (VITAMIN D3) 25 MCG (1000 UT) tablet Take 2,000 Units by mouth daily.   Yes [provider]  diclofenac  Sodium (VOLTAREN ) 1 % GEL Apply 2 g topically 4 (four) times daily. 04/11/23  Yes Nivia Colon, PA-C  hydrocortisone  (ANUSOL -HC) 2.5 % rectal cream Place 1 Application rectally 2 (two) times daily as needed for anal itching. 11/28/23  Yes Mayer, Jodi R, NP  hydroxypropyl methylcellulose / hypromellose (ISOPTO TEARS / GONIOVISC) 2.5 % ophthalmic solution Place 1 drop into both eyes 3 (three) times daily as needed for dry eyes.  Yes [provider]  latanoprost (XALATAN) 0.005 % ophthalmic solution Place 1 drop into both eyes at bedtime. 04/06/19  Yes [provider]  lidocaine  (XYLOCAINE ) 5 % ointment Apply 1 Application topically 3 (three) times daily as needed (anal pain/itching). 11/28/23  Yes Mayer, Jodi R, NP  LUMIGAN 0.01 % SOLN Place 1 drop into both eyes at bedtime. 04/06/19  Yes [provider]  metFORMIN  (GLUCOPHAGE ) 500 MG tablet Take 500 mg by mouth 2 (two) times daily. 03/24/17  Yes [provider]  mupirocin  ointment (BACTROBAN ) 2 % Apply 1 application. topically 2 (two) times daily. 05/30/21  Yes Gershon Donnice SAUNDERS, DPM  pantoprazole  (PROTONIX ) 40 MG tablet Take 1 tablet (40 mg total) by mouth daily. 05/21/19  Yes Regalado,  Belkys A, MD  pravastatin  (PRAVACHOL ) 40 MG tablet Take 1 tablet (40 mg total) by mouth daily. 12/13/15  Yes Avram Barnie NOVAK, FNP  predniSONE  (DELTASONE ) 20 MG tablet Take 2 tablets (40 mg total) by mouth daily for 5 days. 02/11/24 02/16/24 Yes Arriyah Madej E, PA-C  tiZANidine  (ZANAFLEX ) 4 MG tablet Take 1 tablet (4 mg total) by mouth every 6 (six) hours as needed for muscle spasms. 03/16/21  Yes Billy Asberry FALCON, PA-C    Family History Family History  Problem Relation Age of Onset   Diabetes Father    Liver cancer Father        Died at 40    Other Brother        gunshot wound   Colon cancer Neg Hx    Rectal cancer Neg Hx    Stomach cancer Neg Hx    Esophageal cancer Neg Hx     Social History Social History[1]   Allergies   Ace inhibitors and Simvastatin   Review of Systems Review of Systems  Musculoskeletal:        L hand swelling.     Physical Exam Triage Vital Signs ED Triage Vitals  Encounter Vitals Group     BP      Girls Systolic BP Percentile      Girls Diastolic BP Percentile      Boys Systolic BP Percentile      Boys Diastolic BP Percentile      Pulse      Resp      Temp      Temp src      SpO2      Weight      Height      Head Circumference      Peak Flow      Pain Score      Pain Loc      Pain Education      Exclude from Growth Chart    No data found.  Updated Vital Signs BP (!) 148/79 (BP Location: Right Arm)   Pulse 66   Temp 98.7 F (37.1 C)   Resp 15   SpO2 98%   Visual Acuity Right Eye Distance:   Left Eye Distance:   Bilateral Distance:    Right Eye Near:   Left Eye Near:    Bilateral Near:     Physical Exam Vitals reviewed.  Constitutional:      General: She is not in acute distress.    Appearance: Normal appearance. She is not ill-appearing.  HENT:     Head: Normocephalic and atraumatic.  Pulmonary:     Effort: Pulmonary effort is normal.  Musculoskeletal:     Comments: No skin changes or abrasion.  There is  swelling extending from the dorsal radiocarpal joint through the 1st and 2nd metacarpophalangeal joints; this area is tender to palpation. Sensation intact. Grip strength 4/5 due to pain.  Neurological:     General: No focal deficit present.     Mental Status: She is alert and oriented to person, place, and time.  Psychiatric:        Mood and Affect: Mood normal.        Behavior: Behavior normal.        Thought Content: Thought content normal.        Judgment: Judgment normal.      UC Treatments / Results  Labs (all labs ordered are listed, but only abnormal results are displayed) Labs Reviewed  URIC ACID    EKG   Radiology DG Hand Complete Left Result Date: 02/11/2024 CLINICAL DATA:  Nontraumatic swelling over the wrist and hand (worst over the 1st MCP joint) - possible gout EXAM: LEFT HAND - COMPLETE 3+ VIEW COMPARISON:  None Available. FINDINGS: No acute fracture or dislocation. Moderate joint space loss of the first carpometacarpal and triscaphe joints. Possible small erosion along the trapezium and scaphoid bones. Mild joint space loss of the first interphalangeal joint. No bony erosions. Soft tissue swelling about the wrist. Soft tissue calcification noted along the base of the first metatarsal, possibly soft tissue tophus. No radiopaque foreign body. IMPRESSION: 1. No acute fracture or dislocation. 2. Findings suggestive of gout involving the base of the thumb. Electronically Signed   By: Rogelia Myers M.D.   On: 02/11/2024 10:16    Procedures Procedures (including critical care time)  Medications Ordered in UC Medications - No data to display  Initial Impression / Assessment and Plan / UC Course  I have reviewed the triage vital signs and the nursing notes.  Pertinent labs & imaging results that were available during my care of the patient were reviewed by me and considered in my medical decision making (see chart for details).     Patient is a pleasant 73 y.o.  female presenting with acute gout - L hand. The patient is afebrile and nontachycardic.  Antipyretic has not been administered today. This is her first gout flare.   Xray L hand:  1. No acute fracture or dislocation. 2. Findings suggestive of gout involving the base of the thumb.  Will check a uric acid.  She is a type II diabetic, sugars running less than 150 at home, she is not insulin -dependent.  Prednisone  burst sent as below.  We discussed low purine diet, and I provided her with information on this.  Final Clinical Impressions(s) / UC Diagnoses   Final diagnoses:  Swelling of left hand  Acute gout of left hand, unspecified cause     Discharge Instructions      -You have gout!  -We are checking a blood test to confirm it, and will call with results in 2-3 days  -Prednisone  two pills taken at the same time for five days in a row. This can give energy so take in the morning. Take with food.  -Avoid red meat, seafood, fried food. More information later in this packet     ED Prescriptions     Medication Sig Dispense Auth. Provider   predniSONE  (DELTASONE ) 20 MG tablet Take 2 tablets (40 mg total) by mouth daily for 5 days. 10 tablet Khi Mcmillen E, PA-C      PDMP not reviewed this encounter.     [1]  Social  History Tobacco Use   Smoking status: Former    Current packs/day: 0.00    Types: Cigarettes    Quit date: 02/25/2007    Years since quitting: 16.9   Smokeless tobacco: Never  Vaping Use   Vaping status: Never Used  Substance Use Topics   Alcohol  use: No   Drug use: No     Arlyss Leita BRAVO, PA-C 02/11/24 1038

## 2024-02-11 NOTE — Discharge Instructions (Signed)
-  You have gout!  -We are checking a blood test to confirm it, and will call with results in 2-3 days  -Prednisone  two pills taken at the same time for five days in a row. This can give energy so take in the morning. Take with food.  -Avoid red meat, seafood, fried food. More information later in this packet

## 2024-02-12 ENCOUNTER — Ambulatory Visit (HOSPITAL_COMMUNITY): Payer: Self-pay

## 2024-02-12 LAB — URIC ACID: Uric Acid: 3.7 mg/dL (ref 3.1–7.9)

## 2024-02-19 ENCOUNTER — Ambulatory Visit
Admission: RE | Admit: 2024-02-19 | Discharge: 2024-02-19 | Disposition: A | Discharge status: Home / Self Care | Source: Ambulatory Visit | Attending: Internal Medicine | Admitting: Internal Medicine

## 2024-02-19 DIAGNOSIS — Z1231 Encounter for screening mammogram for malignant neoplasm of breast: Secondary | ICD-10-CM
